# Patient Record
Sex: Male | Born: 1945 | Race: Black or African American | Hispanic: No | Smoking: Current some day smoker
Health system: Southern US, Community
[De-identification: ages and names within clinical notes are randomized; demographics above are authoritative.]

## PROBLEM LIST (undated history)

## (undated) DIAGNOSIS — N529 Male erectile dysfunction, unspecified: Secondary | ICD-10-CM

## (undated) DIAGNOSIS — E538 Deficiency of other specified B group vitamins: Secondary | ICD-10-CM

## (undated) DIAGNOSIS — T7840XA Allergy, unspecified, initial encounter: Secondary | ICD-10-CM

## (undated) DIAGNOSIS — M1990 Unspecified osteoarthritis, unspecified site: Secondary | ICD-10-CM

## (undated) DIAGNOSIS — K219 Gastro-esophageal reflux disease without esophagitis: Secondary | ICD-10-CM

## (undated) DIAGNOSIS — R112 Nausea with vomiting, unspecified: Secondary | ICD-10-CM

## (undated) DIAGNOSIS — I10 Essential (primary) hypertension: Secondary | ICD-10-CM

## (undated) DIAGNOSIS — M545 Low back pain, unspecified: Secondary | ICD-10-CM

## (undated) DIAGNOSIS — Z9889 Other specified postprocedural states: Secondary | ICD-10-CM

## (undated) DIAGNOSIS — E785 Hyperlipidemia, unspecified: Secondary | ICD-10-CM

## (undated) DIAGNOSIS — R011 Cardiac murmur, unspecified: Secondary | ICD-10-CM

## (undated) DIAGNOSIS — N400 Enlarged prostate without lower urinary tract symptoms: Secondary | ICD-10-CM

## (undated) HISTORY — PX: BACK SURGERY: SHX140

## (undated) HISTORY — DX: Male erectile dysfunction, unspecified: N52.9

## (undated) HISTORY — DX: Low back pain: M54.5

## (undated) HISTORY — DX: Low back pain, unspecified: M54.50

## (undated) HISTORY — DX: Benign prostatic hyperplasia without lower urinary tract symptoms: N40.0

## (undated) HISTORY — PX: COLONOSCOPY: SHX174

## (undated) HISTORY — DX: Allergy, unspecified, initial encounter: T78.40XA

## (undated) HISTORY — DX: Gastro-esophageal reflux disease without esophagitis: K21.9

## (undated) HISTORY — PX: POLYPECTOMY: SHX149

## (undated) HISTORY — DX: Hyperlipidemia, unspecified: E78.5

## (undated) HISTORY — DX: Deficiency of other specified B group vitamins: E53.8

## (undated) HISTORY — DX: Essential (primary) hypertension: I10

---

## 1999-05-22 LAB — HM COLONOSCOPY

## 2000-01-22 ENCOUNTER — Encounter: Payer: Self-pay | Admitting: Emergency Medicine

## 2000-01-22 ENCOUNTER — Emergency Department (HOSPITAL_COMMUNITY): Admission: EM | Admit: 2000-01-22 | Discharge: 2000-01-22 | Payer: Self-pay | Admitting: Emergency Medicine

## 2000-05-21 HISTORY — PX: KNEE ARTHROSCOPY: SUR90

## 2001-03-23 ENCOUNTER — Emergency Department (HOSPITAL_COMMUNITY): Admission: EM | Admit: 2001-03-23 | Discharge: 2001-03-23 | Payer: Self-pay | Admitting: Emergency Medicine

## 2001-10-09 ENCOUNTER — Emergency Department (HOSPITAL_COMMUNITY): Admission: EM | Admit: 2001-10-09 | Discharge: 2001-10-09 | Payer: Self-pay | Admitting: *Deleted

## 2002-01-20 ENCOUNTER — Emergency Department (HOSPITAL_COMMUNITY): Admission: EM | Admit: 2002-01-20 | Discharge: 2002-01-20 | Payer: Self-pay

## 2002-04-08 ENCOUNTER — Encounter: Payer: Self-pay | Admitting: Specialist

## 2002-04-08 ENCOUNTER — Ambulatory Visit (HOSPITAL_COMMUNITY): Admission: RE | Admit: 2002-04-08 | Discharge: 2002-04-08 | Payer: Self-pay | Admitting: Specialist

## 2003-01-24 ENCOUNTER — Emergency Department (HOSPITAL_COMMUNITY): Admission: EM | Admit: 2003-01-24 | Discharge: 2003-01-25 | Payer: Self-pay | Admitting: Emergency Medicine

## 2003-01-25 ENCOUNTER — Encounter: Payer: Self-pay | Admitting: Emergency Medicine

## 2004-12-20 ENCOUNTER — Ambulatory Visit: Payer: Self-pay | Admitting: Internal Medicine

## 2005-01-04 ENCOUNTER — Ambulatory Visit: Payer: Self-pay

## 2005-01-15 ENCOUNTER — Ambulatory Visit: Payer: Self-pay | Admitting: Internal Medicine

## 2005-02-28 ENCOUNTER — Ambulatory Visit: Payer: Self-pay | Admitting: Cardiology

## 2005-07-18 ENCOUNTER — Ambulatory Visit: Payer: Self-pay | Admitting: Internal Medicine

## 2005-12-12 ENCOUNTER — Ambulatory Visit: Payer: Self-pay | Admitting: Internal Medicine

## 2006-05-08 ENCOUNTER — Ambulatory Visit: Payer: Self-pay | Admitting: Internal Medicine

## 2006-05-08 LAB — CONVERTED CEMR LAB
ALT: 23 units/L (ref 0–40)
AST: 25 units/L (ref 0–37)
Albumin: 4 g/dL (ref 3.5–5.2)
Alkaline Phosphatase: 110 units/L (ref 39–117)
BUN: 9 mg/dL (ref 6–23)
CO2: 27 meq/L (ref 19–32)
Calcium: 9.6 mg/dL (ref 8.4–10.5)
Chloride: 105 meq/L (ref 96–112)
Chol/HDL Ratio, serum: 4.3
Cholesterol: 177 mg/dL (ref 0–200)
Creatinine, Ser: 0.8 mg/dL (ref 0.4–1.5)
GFR calc non Af Amer: 105 mL/min
Glomerular Filtration Rate, Af Am: 127 mL/min/{1.73_m2}
Glucose, Bld: 108 mg/dL — ABNORMAL HIGH (ref 70–99)
HCT: 46.3 % (ref 39.0–52.0)
HDL: 41.3 mg/dL (ref >39.0)
Hemoglobin, Urine: NEGATIVE
Hemoglobin: 15.4 g/dL (ref 13.0–17.0)
Hgb A1c MFr Bld: 5.6 % (ref 4.6–6.0)
Ketones, ur: NEGATIVE mg/dL
LDL Cholesterol: 123 mg/dL — ABNORMAL HIGH (ref 0–99)
Leukocytes, UA: NEGATIVE
MCHC: 33.3 g/dL (ref 30.0–36.0)
MCV: 89.6 fL (ref 78.0–100.0)
Nitrite: NEGATIVE
PSA: 0.86 ng/mL (ref 0.10–4.00)
Platelets: 253 10*3/uL (ref 150–400)
Potassium: 3.9 meq/L (ref 3.5–5.1)
RBC: 5.16 M/uL (ref 4.22–5.81)
RDW: 12.4 % (ref 11.5–14.6)
Sodium: 139 meq/L (ref 135–145)
Specific Gravity, Urine: 1.025 (ref 1.000–1.03)
TSH: 1.12 microintl units/mL (ref 0.35–5.50)
Total Bilirubin: 1.3 mg/dL — ABNORMAL HIGH (ref 0.3–1.2)
Total Protein, Urine: NEGATIVE mg/dL
Total Protein: 7.1 g/dL (ref 6.0–8.3)
Triglyceride fasting, serum: 63 mg/dL (ref 0–149)
Urine Glucose: NEGATIVE mg/dL
Urobilinogen, UA: 0.2 (ref 0.0–1.0)
VLDL: 13 mg/dL (ref 0–40)
Vitamin B-12: 280 pg/mL (ref 211–911)
WBC: 7.3 10*3/uL (ref 4.5–10.5)
pH: 6 (ref 5.0–8.0)

## 2006-06-12 ENCOUNTER — Ambulatory Visit: Payer: Self-pay | Admitting: Internal Medicine

## 2006-12-11 ENCOUNTER — Ambulatory Visit: Payer: Self-pay | Admitting: Internal Medicine

## 2007-05-16 ENCOUNTER — Telehealth: Payer: Self-pay | Admitting: Internal Medicine

## 2007-05-19 ENCOUNTER — Ambulatory Visit: Payer: Self-pay | Admitting: Internal Medicine

## 2007-05-19 DIAGNOSIS — E538 Deficiency of other specified B group vitamins: Secondary | ICD-10-CM | POA: Insufficient documentation

## 2007-05-28 LAB — CONVERTED CEMR LAB
ALT: 29 units/L (ref 0–53)
AST: 25 units/L (ref 0–37)
Albumin: 3.9 g/dL (ref 3.5–5.2)
Alkaline Phosphatase: 100 units/L (ref 39–117)
BUN: 10 mg/dL (ref 6–23)
Basophils Absolute: 0 10*3/uL (ref 0.0–0.1)
Basophils Relative: 0.5 % (ref 0.0–1.0)
Bilirubin Urine: NEGATIVE
Bilirubin, Direct: 0.2 mg/dL (ref 0.0–0.3)
CO2: 32 meq/L (ref 19–32)
Calcium: 9.5 mg/dL (ref 8.4–10.5)
Chloride: 101 meq/L (ref 96–112)
Creatinine, Ser: 0.9 mg/dL (ref 0.4–1.5)
Eosinophils Absolute: 0.1 10*3/uL (ref 0.0–0.6)
Eosinophils Relative: 1.9 % (ref 0.0–5.0)
GFR calc Af Amer: 111 mL/min
GFR calc non Af Amer: 91 mL/min
Glucose, Bld: 100 mg/dL — ABNORMAL HIGH (ref 70–99)
HCT: 44.9 % (ref 39.0–52.0)
Hemoglobin, Urine: NEGATIVE
Hemoglobin: 15.6 g/dL (ref 13.0–17.0)
Ketones, ur: NEGATIVE mg/dL
Leukocytes, UA: NEGATIVE
Lymphocytes Relative: 23.4 % (ref 12.0–46.0)
MCHC: 34.9 g/dL (ref 30.0–36.0)
MCV: 89 fL (ref 78.0–100.0)
Monocytes Absolute: 0.7 10*3/uL (ref 0.2–0.7)
Monocytes Relative: 9.4 % (ref 3.0–11.0)
Neutro Abs: 4.6 10*3/uL (ref 1.4–7.7)
Neutrophils Relative %: 64.8 % (ref 43.0–77.0)
Nitrite: NEGATIVE
PSA: 0.79 ng/mL (ref 0.10–4.00)
Platelets: 215 10*3/uL (ref 150–400)
Potassium: 3.6 meq/L (ref 3.5–5.1)
RBC: 5.04 M/uL (ref 4.22–5.81)
RDW: 12.2 % (ref 11.5–14.6)
Sodium: 140 meq/L (ref 135–145)
Specific Gravity, Urine: 1.02 (ref 1.000–1.03)
TSH: 1.67 microintl units/mL (ref 0.35–5.50)
Total Bilirubin: 0.8 mg/dL (ref 0.3–1.2)
Total Protein, Urine: NEGATIVE mg/dL
Total Protein: 7.4 g/dL (ref 6.0–8.3)
Urine Glucose: NEGATIVE mg/dL
Urobilinogen, UA: 0.2 (ref 0.0–1.0)
Vitamin B-12: 211 pg/mL (ref 211–911)
WBC: 7.1 10*3/uL (ref 4.5–10.5)
pH: 6 (ref 5.0–8.0)

## 2007-06-18 ENCOUNTER — Ambulatory Visit: Payer: Self-pay | Admitting: Internal Medicine

## 2007-06-18 DIAGNOSIS — R350 Frequency of micturition: Secondary | ICD-10-CM | POA: Insufficient documentation

## 2007-06-18 DIAGNOSIS — I10 Essential (primary) hypertension: Secondary | ICD-10-CM | POA: Insufficient documentation

## 2007-07-30 ENCOUNTER — Ambulatory Visit: Payer: Self-pay | Admitting: Endocrinology

## 2007-07-30 ENCOUNTER — Telehealth (INDEPENDENT_AMBULATORY_CARE_PROVIDER_SITE_OTHER): Payer: Self-pay | Admitting: *Deleted

## 2007-10-15 ENCOUNTER — Ambulatory Visit: Payer: Self-pay | Admitting: Internal Medicine

## 2007-10-16 DIAGNOSIS — E785 Hyperlipidemia, unspecified: Secondary | ICD-10-CM | POA: Insufficient documentation

## 2008-03-17 ENCOUNTER — Ambulatory Visit: Payer: Self-pay | Admitting: Internal Medicine

## 2008-03-19 LAB — CONVERTED CEMR LAB
ALT: 26 U/L
AST: 25 U/L
Albumin: 3.9 g/dL
Alkaline Phosphatase: 107 U/L
BUN: 13 mg/dL
Basophils Absolute: 0 K/uL
Basophils Relative: 0.4 %
Bilirubin Urine: NEGATIVE
Bilirubin, Direct: 0.2 mg/dL
CO2: 32 meq/L
Calcium: 9.3 mg/dL
Chloride: 108 meq/L
Cholesterol: 170 mg/dL
Creatinine, Ser: 0.7 mg/dL
Eosinophils Absolute: 0.2 K/uL
Eosinophils Relative: 3 %
Folate: 14.4 ng/mL
GFR calc Af Amer: 148 mL/min
GFR calc non Af Amer: 122 mL/min
Glucose, Bld: 91 mg/dL
HCT: 41.5 %
HDL: 32.9 mg/dL — ABNORMAL LOW
Hemoglobin, Urine: NEGATIVE
Hemoglobin: 14.4 g/dL
Ketones, ur: NEGATIVE mg/dL
LDL Cholesterol: 123 mg/dL — ABNORMAL HIGH
Leukocytes, UA: NEGATIVE
Lymphocytes Relative: 29.2 %
MCHC: 34.8 g/dL
MCV: 88.9 fL
Monocytes Absolute: 0.8 K/uL
Monocytes Relative: 13.2 % — ABNORMAL HIGH
Neutro Abs: 3.2 K/uL
Neutrophils Relative %: 54.2 %
Nitrite: NEGATIVE
PSA: 1.34 ng/mL
Platelets: 202 K/uL
Potassium: 4.5 meq/L
RBC: 4.67 M/uL
RDW: 12.4 %
Sodium: 145 meq/L
Specific Gravity, Urine: 1.015
TSH: 1.08 u[IU]/mL
Total Bilirubin: 0.9 mg/dL
Total CHOL/HDL Ratio: 5.2
Total Protein, Urine: NEGATIVE mg/dL
Total Protein: 6.7 g/dL
Triglycerides: 71 mg/dL
Urine Glucose: NEGATIVE mg/dL
Urobilinogen, UA: 0.2
VLDL: 14 mg/dL
Vitamin B-12: 518 pg/mL
WBC: 5.9 10*3/microliter
pH: 6.5

## 2008-03-31 ENCOUNTER — Ambulatory Visit: Payer: Self-pay | Admitting: Internal Medicine

## 2009-03-30 ENCOUNTER — Ambulatory Visit: Payer: Self-pay | Admitting: Internal Medicine

## 2009-03-30 DIAGNOSIS — M545 Low back pain, unspecified: Secondary | ICD-10-CM | POA: Insufficient documentation

## 2009-03-30 DIAGNOSIS — N400 Enlarged prostate without lower urinary tract symptoms: Secondary | ICD-10-CM | POA: Insufficient documentation

## 2009-03-30 LAB — CONVERTED CEMR LAB
ALT: 28 units/L (ref 0–53)
AST: 30 units/L (ref 0–37)
Albumin: 4.3 g/dL (ref 3.5–5.2)
Alkaline Phosphatase: 98 units/L (ref 39–117)
BUN: 13 mg/dL (ref 6–23)
Basophils Absolute: 0 10*3/uL (ref 0.0–0.1)
Basophils Relative: 0.6 % (ref 0.0–3.0)
Bilirubin Urine: NEGATIVE
Bilirubin, Direct: 0.1 mg/dL (ref 0.0–0.3)
CO2: 30 meq/L (ref 19–32)
Calcium: 9.3 mg/dL (ref 8.4–10.5)
Chloride: 105 meq/L (ref 96–112)
Cholesterol: 181 mg/dL (ref 0–200)
Creatinine, Ser: 0.8 mg/dL (ref 0.4–1.5)
Eosinophils Absolute: 0.2 10*3/uL (ref 0.0–0.7)
Eosinophils Relative: 2.7 % (ref 0.0–5.0)
GFR calc non Af Amer: 126.01 mL/min (ref >60)
Glucose, Bld: 99 mg/dL (ref 70–99)
HCT: 44.4 % (ref 39.0–52.0)
HDL: 37.1 mg/dL — ABNORMAL LOW (ref >39.00)
Hemoglobin, Urine: NEGATIVE
Hemoglobin: 15 g/dL (ref 13.0–17.0)
Ketones, ur: NEGATIVE mg/dL
LDL Cholesterol: 134 mg/dL — ABNORMAL HIGH (ref 0–99)
Leukocytes, UA: NEGATIVE
Lymphocytes Relative: 30.7 % (ref 12.0–46.0)
Lymphs Abs: 1.8 10*3/uL (ref 0.7–4.0)
MCHC: 33.7 g/dL (ref 30.0–36.0)
MCV: 89.6 fL (ref 78.0–100.0)
Monocytes Absolute: 0.7 10*3/uL (ref 0.1–1.0)
Monocytes Relative: 11.6 % (ref 3.0–12.0)
Neutro Abs: 3.2 10*3/uL (ref 1.4–7.7)
Neutrophils Relative %: 54.4 % (ref 43.0–77.0)
Nitrite: NEGATIVE
PSA: 1.1 ng/mL (ref 0.10–4.00)
Platelets: 204 10*3/uL (ref 150.0–400.0)
Potassium: 3.8 meq/L (ref 3.5–5.1)
RBC: 4.96 M/uL (ref 4.22–5.81)
RDW: 12.4 % (ref 11.5–14.6)
Sodium: 142 meq/L (ref 135–145)
Specific Gravity, Urine: 1.02 (ref 1.000–1.030)
TSH: 1.2 microintl units/mL (ref 0.35–5.50)
Total Bilirubin: 1.2 mg/dL (ref 0.3–1.2)
Total CHOL/HDL Ratio: 5
Total Protein, Urine: NEGATIVE mg/dL
Total Protein: 8.5 g/dL — ABNORMAL HIGH (ref 6.0–8.3)
Triglycerides: 49 mg/dL (ref 0.0–149.0)
Urine Glucose: NEGATIVE mg/dL
Urobilinogen, UA: 0.2 (ref 0.0–1.0)
VLDL: 9.8 mg/dL (ref 0.0–40.0)
Vitamin B-12: 450 pg/mL (ref 211–911)
WBC: 5.9 10*3/uL (ref 4.5–10.5)
pH: 5.5 (ref 5.0–8.0)

## 2009-04-16 ENCOUNTER — Ambulatory Visit: Payer: Self-pay | Admitting: Family Medicine

## 2009-04-16 DIAGNOSIS — M25519 Pain in unspecified shoulder: Secondary | ICD-10-CM | POA: Insufficient documentation

## 2009-05-16 ENCOUNTER — Ambulatory Visit: Payer: Self-pay | Admitting: Internal Medicine

## 2009-05-16 DIAGNOSIS — R51 Headache: Secondary | ICD-10-CM | POA: Insufficient documentation

## 2009-05-16 DIAGNOSIS — R519 Headache, unspecified: Secondary | ICD-10-CM | POA: Insufficient documentation

## 2009-05-18 LAB — CONVERTED CEMR LAB
BUN: 10 mg/dL (ref 6–23)
CO2: 30 meq/L (ref 19–32)
Calcium: 9.4 mg/dL (ref 8.4–10.5)
Chloride: 105 meq/L (ref 96–112)
Creatinine, Ser: 0.7 mg/dL (ref 0.4–1.5)
GFR calc non Af Amer: 146.94 mL/min (ref >60)
Glucose, Bld: 108 mg/dL — ABNORMAL HIGH (ref 70–99)
Potassium: 3.8 meq/L (ref 3.5–5.1)
Sed Rate: 11 mm/hr (ref 0–22)
Sodium: 142 meq/L (ref 135–145)

## 2009-08-16 ENCOUNTER — Ambulatory Visit: Payer: Self-pay | Admitting: Internal Medicine

## 2009-11-23 ENCOUNTER — Ambulatory Visit: Payer: Self-pay | Admitting: Internal Medicine

## 2009-12-27 ENCOUNTER — Ambulatory Visit: Payer: Self-pay | Admitting: Internal Medicine

## 2010-02-01 ENCOUNTER — Ambulatory Visit: Payer: Self-pay | Admitting: Internal Medicine

## 2010-02-01 LAB — CONVERTED CEMR LAB
ALT: 24 units/L (ref 0–53)
AST: 21 units/L (ref 0–37)
Albumin: 4.1 g/dL (ref 3.5–5.2)
Alkaline Phosphatase: 96 units/L (ref 39–117)
BUN: 14 mg/dL (ref 6–23)
Basophils Absolute: 0 10*3/uL (ref 0.0–0.1)
Basophils Relative: 0.4 % (ref 0.0–3.0)
Bilirubin Urine: NEGATIVE
Bilirubin, Direct: 0.2 mg/dL (ref 0.0–0.3)
CO2: 30 meq/L (ref 19–32)
Calcium: 9.4 mg/dL (ref 8.4–10.5)
Chloride: 106 meq/L (ref 96–112)
Cholesterol: 186 mg/dL (ref 0–200)
Creatinine, Ser: 0.7 mg/dL (ref 0.4–1.5)
Eosinophils Absolute: 0.2 10*3/uL (ref 0.0–0.7)
Eosinophils Relative: 3.1 % (ref 0.0–5.0)
GFR calc non Af Amer: 139.67 mL/min (ref >60)
Glucose, Bld: 89 mg/dL (ref 70–99)
HCT: 43.7 % (ref 39.0–52.0)
HDL: 47.3 mg/dL (ref >39.00)
Hemoglobin, Urine: NEGATIVE
Hemoglobin: 15.1 g/dL (ref 13.0–17.0)
Ketones, ur: NEGATIVE mg/dL
LDL Cholesterol: 134 mg/dL — ABNORMAL HIGH (ref 0–99)
Leukocytes, UA: NEGATIVE
Lymphocytes Relative: 24.7 % (ref 12.0–46.0)
Lymphs Abs: 1.8 10*3/uL (ref 0.7–4.0)
MCHC: 34.5 g/dL (ref 30.0–36.0)
MCV: 89.2 fL (ref 78.0–100.0)
Monocytes Absolute: 0.8 10*3/uL (ref 0.1–1.0)
Monocytes Relative: 10.9 % (ref 3.0–12.0)
Neutro Abs: 4.4 10*3/uL (ref 1.4–7.7)
Neutrophils Relative %: 60.9 % (ref 43.0–77.0)
Nitrite: NEGATIVE
PSA: 0.61 ng/mL (ref 0.10–4.00)
Platelets: 216 10*3/uL (ref 150.0–400.0)
Potassium: 4.4 meq/L (ref 3.5–5.1)
RBC: 4.89 M/uL (ref 4.22–5.81)
RDW: 13.8 % (ref 11.5–14.6)
Sodium: 142 meq/L (ref 135–145)
Specific Gravity, Urine: 1.025 (ref 1.000–1.030)
TSH: 0.95 microintl units/mL (ref 0.35–5.50)
Total Bilirubin: 1 mg/dL (ref 0.3–1.2)
Total CHOL/HDL Ratio: 4
Total Protein, Urine: NEGATIVE mg/dL
Total Protein: 7.1 g/dL (ref 6.0–8.3)
Triglycerides: 26 mg/dL (ref 0.0–149.0)
Urine Glucose: NEGATIVE mg/dL
Urobilinogen, UA: 0.2 (ref 0.0–1.0)
VLDL: 5.2 mg/dL (ref 0.0–40.0)
Vitamin B-12: 435 pg/mL (ref 211–911)
WBC: 7.3 10*3/uL (ref 4.5–10.5)
pH: 6 (ref 5.0–8.0)

## 2010-02-14 ENCOUNTER — Ambulatory Visit: Payer: Self-pay | Admitting: Internal Medicine

## 2010-02-14 ENCOUNTER — Encounter: Payer: Self-pay | Admitting: Internal Medicine

## 2010-04-03 ENCOUNTER — Encounter (INDEPENDENT_AMBULATORY_CARE_PROVIDER_SITE_OTHER): Payer: Self-pay | Admitting: *Deleted

## 2010-05-31 ENCOUNTER — Encounter (INDEPENDENT_AMBULATORY_CARE_PROVIDER_SITE_OTHER): Payer: Self-pay | Admitting: *Deleted

## 2010-06-12 ENCOUNTER — Encounter (INDEPENDENT_AMBULATORY_CARE_PROVIDER_SITE_OTHER): Payer: Self-pay | Admitting: *Deleted

## 2010-06-14 ENCOUNTER — Ambulatory Visit
Admission: RE | Admit: 2010-06-14 | Discharge: 2010-06-14 | Payer: Self-pay | Source: Home / Self Care | Attending: Internal Medicine | Admitting: Internal Medicine

## 2010-06-20 NOTE — Assessment & Plan Note (Signed)
Summary: 3 MO ROV /NWS   Vital Signs:  Patient profile:   65 year old male Weight:      213 pounds Temp:     97 degrees F oral Pulse rate:   56 / minute BP sitting:   110 / 92  (left arm)  Vitals Entered By: Tora Perches (August 16, 2009 2:14 PM) CC: f/u Is Patient Diabetic? No   CC:  f/u.  History of Present Illness: F/u HTN, B12 and D def  Preventive Screening-Counseling & Management  Alcohol-Tobacco     Smoking Status: quit  Current Medications (verified): 1)  Vitamin B-12 1000 Mcg  Tabs (Cyanocobalamin) .... 2 Qd 2)  Vitamin D3 1000 Unit  Tabs (Cholecalciferol) .Marland Kitchen.. 1 Qd 3)  Ibuprofen 600 Mg  Tabs (Ibuprofen) .Marland Kitchen.. 1 By Mouth Two Times A Day As Needed Pain 4)  Vicodin 5-500 Mg Tabs (Hydrocodone-Acetaminophen) .... One Tab By Mouth Two Times A Day 5)  Azor 10-40 Mg Tabs (Amlodipine-Olmesartan) .Marland Kitchen.. 1 By Mouth Once Daily For Blood Pressure  Allergies: 1)  Hydrochlorothiazide  Past History:  Past Medical History: Last updated: 03/30/2009 Hypertension ED Vit B12 def Hyperlipidemia Benign prostatic hypertrophy Low back pain  Family History: Reviewed history from 06/18/2007 and no changes required. 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    Physical Exam  General:  Well-developed,well-nourished,in no acute distress; alert,appropriate and cooperative throughout examination Mouth:  Oral mucosa and oropharynx without lesions or exudates.  Teeth in good repair. Lungs:  Normal respiratory effort, chest expands symmetrically. Lungs are clear to auscultation, no crackles or wheezes. Heart:  Normal rate and regular rhythm. S1 and S2 normal without gallop, murmur, click, rub or other extra sounds. Abdomen:  Bowel sounds positive,abdomen soft and non-tender without masses,  organomegaly or hernias noted.   Impression & Recommendations:  Problem # 1:  B12 DEFICIENCY (ICD-266.2) Assessment Improved On prescription/OTC  therapy   Problem # 2:  HYPERTENSION (ICD-401.9) Assessment: Improved  His updated medication list for this problem includes:    Azor 10-40 Mg Tabs (Amlodipine-olmesartan) .Marland Kitchen... 1 by mouth once daily for blood pressure  Problem # 3:  HYPERLIPIDEMIA (ICD-272.4) On diet  Complete Medication List: 1)  Vitamin B-12 1000 Mcg Tabs (Cyanocobalamin) .... 2 qd 2)  Vitamin D3 1000 Unit Tabs (Cholecalciferol) .Marland Kitchen.. 1 qd 3)  Ibuprofen 600 Mg Tabs (Ibuprofen) .Marland Kitchen.. 1 by mouth two times a day as needed pain 4)  Vicodin 5-500 Mg Tabs (Hydrocodone-acetaminophen) .... One tab by mouth two times a day 5)  Azor 10-40 Mg Tabs (Amlodipine-olmesartan) .Marland Kitchen.. 1 by mouth once daily for blood pressure  Patient Instructions: 1)  Please schedule a follow-up appointment in 6 months well w/labs and Vit B12 266.20. Prescriptions: AZOR 10-40 MG TABS (AMLODIPINE-OLMESARTAN) 1 by mouth once daily for blood pressure  #90 x 3   Entered and Authorized by:   Tresa Garter MD   Signed by:   Tresa Garter MD on 08/16/2009   Method used:   Print then Give to Patient   RxID:   [id number]

## 2010-06-20 NOTE — Assessment & Plan Note (Signed)
Summary: CPX /NWS #   Vital Signs:  Patient profile:   65 year old male Height:      72 inches Weight:      207 pounds BMI:     28.18 Temp:     97.8 degrees F oral Pulse rate:   80 / minute Pulse rhythm:   regular Resp:     16 per minute BP sitting:   138 / 84  (left arm) Cuff size:   regular  Vitals Entered By: Lanier Prude, Beverly Gust) (February 14, 2010 3:11 PM) CC: CPX Is Patient Diabetic? No Comments pt is not taking Vicodin, Loratadine or Prednisone. Please remove from list   CC:  CPX.  History of Present Illness: The patient presents for a preventive health examination  C/o leg cramps at night  Preventive Screening-Counseling & Management  Alcohol-Tobacco     Smoking Status: quit  Current Medications (verified): 1)  Vitamin B-12 1000 Mcg  Tabs (Cyanocobalamin) .... 2 Qd 2)  Vitamin D3 1000 Unit  Tabs (Cholecalciferol) .Marland Kitchen.. 1 Qd 3)  Ibuprofen 600 Mg  Tabs (Ibuprofen) .Marland Kitchen.. 1 By Mouth Two Times A Day As Needed Pain 4)  Vicodin 5-500 Mg Tabs (Hydrocodone-Acetaminophen) .... One Tab By Mouth Two Times A Day 5)  Azor 10-40 Mg Tabs (Amlodipine-Olmesartan) .Marland Kitchen.. 1 By Mouth Once Daily For Blood Pressure 6)  Aleve 220 Mg Tabs (Naproxen Sodium) .Marland Kitchen.. 1 As Needed 7)  Spironolactone 50 Mg Tabs (Spironolactone) .Marland Kitchen.. 1 By Mouth Qam For Blood Pressure 8)  Triamcinolone Acetonide 0.5 % Crea (Triamcinolone Acetonide) .... Use Two Times A Day Prn 9)  Loratadine 10 Mg Tabs (Loratadine) .Marland Kitchen.. 1 By Mouth Once Daily As Needed Allergies 10)  Prednisone 10 Mg Tabs (Prednisone) .... Take 40mg  Qd For 3 Days, Then 20 Mg Qd For 3 Days, Then 10mg  Qd For 6 Days, Then Stop. Take Pc.  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    Physical Exam  General:  Well-developed,well-nourished,in no acute distress; alert,appropriate and cooperative throughout examination Head:  Normocephalic and atraumatic without obvious abnormalities. No apparent alopecia or balding. No pulsating and tender vessels Eyes:  No corneal or conjunctival inflammation noted. EOMI. Perrla.  Ears:  External ear exam shows no significant lesions or deformities.  Otoscopic examination reveals clear canals, tympanic membranes are intact bilaterally without bulging, retraction, inflammation or discharge. Hearing is grossly normal bilaterally. Nose:  External nasal examination shows no deformity or inflammation. Nasal mucosa are pink and moist without lesions or exudates. Mouth:  Oral mucosa and oropharynx without lesions or exudates.  Teeth in good repair. Neck:  No deformities, masses, or tenderness noted. Lungs:  Normal respiratory effort, chest expands symmetrically. Lungs are clear to auscultation, no crackles or wheezes. Heart:  Normal rate and regular rhythm. S1 and S2 normal  without gallop, murmur, click, rub or other extra sounds. Abdomen:  Bowel sounds positive,abdomen soft and non-tender without masses, organomegaly or hernias noted. Prostate:  1+ enlarged.   Msk:  Lumbar-sacral  spine is tender to palpation over paraspinal muscles and painfull with the ROM  Stiff ls flat feet Neurologic:  No cranial nerve deficits noted. Station and gait are normal. Plantar reflexes are down-going bilaterally. DTRs are symmetrical throughout. Sensory, motor and coordinative functions appear intact. Skin:  Eryth. papules 0.5-2.0 cm with scabs and excoriations on LEs - multiples Psych:  Cognition and judgment appear intact. Alert and cooperative with normal attention span and concentration. No apparent delusions, illusions, hallucinations   Impression & Recommendations:  Problem # 1:  WELL ADULT EXAM (ICD-V70.0) Assessment New Health and age related issues were discussed. Available screening tests and vaccinations were discussed as well. Healthy life style including good diet and exercise was discussed.  The labs were reviewed with the patient.  Orders: EKG w/ Interpretation (93000) ok Gastroenterology Referral (GI)  Problem # 2:  HYPERTENSION (ICD-401.9) Assessment: Comment Only  His updated medication list for this problem includes:    Azor 10-40 Mg Tabs (Amlodipine-olmesartan) .Marland Kitchen... 1 by mouth once daily for blood pressure    Spironolactone 50 Mg Tabs (Spironolactone) .Marland Kitchen... 1 by mouth qam for blood pressure  BP today: 138/84 Prior BP: 142/68 (12/27/2009)  Labs Reviewed: K+: 4.4 (02/01/2010) Creat: : 0.7 (02/01/2010)   Chol: 186 (02/01/2010)   HDL: 47.30 (02/01/2010)   LDL: 134 (02/01/2010)   TG: 26.0 (02/01/2010)  Problem # 3:  B12 DEFICIENCY (ICD-266.2) Assessment: Unchanged On the regimen of medicine(s) reflected in the chart    Problem # 4:  HYPERLIPIDEMIA (ICD-272.4) Assessment: Comment Only on diet  Complete Medication List: 1)  Ibuprofen 600 Mg Tabs (Ibuprofen) .Marland Kitchen.. 1 by mouth two times a day as needed pain 2)  Vicodin 5-500 Mg Tabs (Hydrocodone-acetaminophen) .... One tab by mouth two times a day 3)  Azor 10-40 Mg Tabs (Amlodipine-olmesartan) .Marland Kitchen.. 1 by  mouth once daily for blood pressure 4)  Aleve 220 Mg Tabs (Naproxen sodium) .Marland Kitchen.. 1 as needed 5)  Spironolactone 50 Mg Tabs (Spironolactone) .Marland Kitchen.. 1 by mouth qam for blood pressure 6)  Triamcinolone Acetonide 0.5 % Crea (Triamcinolone acetonide) .... Use two times a day prn 7)  Loratadine 10 Mg Tabs (Loratadine) .Marland Kitchen.. 1 by mouth once daily as needed allergies 8)  Vitamin B-12 1000 Mcg Tabs (Cyanocobalamin) .... 2 qd 9)  Vitamin D3 1000 Unit Tabs (Cholecalciferol) .Marland Kitchen.. 1 qd 10)  Cialis 20 Mg Tabs (Tadalafil) .... 1/2 or 1 by mouth q 1-3 d prn  Other Orders: Zoster (Shingles) Vaccine Live [phone]) Admin 1st Vaccine (60454)  Patient Instructions: 1)  Please schedule a follow-up appointment in 6 months. 2)  Go on Youtube ([email or web address]) and look up "piriformis stretch", "Ileopsoas stretch", "IT band stretch" and "gluteus stretch". See the anatomy and learn the symptoms.  You can try to self-diagnose. Do the stretches - it may help!  Prescriptions: CIALIS 20 MG TABS (TADALAFIL) 1/2 or 1 by mouth q 1-3 d prn  #6 x 12   Entered and Authorized by:   Tresa Garter MD   Signed by:   Tresa Garter MD on 02/14/2010   Method used:   Print then Give to Patient   RxID:   [id number]    Immunizations Administered:  Zostavax # 1:    Vaccine Type: Zostavax    Site: left deltoid    Mfr:  Merck    Dose: 0.65    Route: [employer]    Given by: Lanier Prude, CMA(AAMA)    Exp. Date: 12/14/2010    Lot #: [id number]    VIS given: 03/02/05 given February 14, 2010.  Not Administered:    Influenza Vaccine not given due to: declined     Contraindications/Deferment of Procedures/Staging:    Test/Procedure: FLU VAX    Reason for deferment: patient declined

## 2010-06-20 NOTE — Assessment & Plan Note (Signed)
Summary: BED BUG BITES/NWS   Vital Signs:  Patient profile:   65 year old male Temp:     98.0 degrees F oral Pulse rate:   56 / minute Pulse rhythm:   regular BP sitting:   142 / 68  (left arm)  Vitals Entered By: Lamar Sprinkles, CMA (December 27, 2009 4:05 PM) CC: C/o bites from bed bugs during hotel stay. Bites on arms and legs    CC:  C/o bites from bed bugs during hotel stay. Bites on arms and legs .  History of Present Illness: C/o hives started 1 wk after sleeping at the extended stay hotel - itching. No fever.  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    Physical Exam  General:  Well-developed,well-nourished,in no acute distress; alert,appropriate and cooperative throughout examination Nose:  External nasal examination shows no deformity or inflammation. Nasal mucosa are pink and moist without lesions or exudates. Mouth:  Oral mucosa and oropharynx without lesions or exudates.  Teeth in good repair. Neck:  No deformities, masses, or tenderness noted. Lungs:  Normal respiratory effort, chest expands symmetrically. Lungs are clear to auscultation, no crackles or wheezes. Heart:  Normal rate and regular rhythm. S1 and S2 normal without gallop, murmur, click, rub or other extra sounds. Abdomen:  Bowel sounds positive,abdomen soft and non-tender without masses, organomegaly or hernias noted. Msk:  R shoulder exquisitely tender ver the right A/C joint,  medially, ROM significantly limited, movement in any direction causes pain. No echymosis noted. No swelling noted. His right arm feels tight. Neurologic:  No cranial nerve deficits noted. Station and gait are normal. Plantar reflexes are down-going bilaterally. DTRs are symmetrical throughout. Sensory, motor and coordinative functions appear intact. Skin:  Eryth. papules 0.5-2.0 cm with scabs and excoriations on LEs - multiples Cervical Nodes:  No lymphadenopathy noted Psych:  Cognition and judgment appear intact. Alert and cooperative with normal attention span and concentration. No apparent delusions, illusions, hallucinations   Impression & Recommendations:  Problem # 1:  SKIN RASH (ICD-782.1) poss bed bug bites vs other Assessment New  His updated medication list for this problem includes:    Triamcinolone Acetonide 0.5 % Crea (Triamcinolone acetonide) ..... Use two times a day prn Take 40mg  qd for 3 days, then 20 mg qd for 3 days, then 10mg  qd for 6 days, then stop. Take pc.  Orders: Depo- Medrol 80mg  (J1040) Admin of Therapeutic Inj  intramuscular or subcutaneous (73220)  Problem # 2:  HYPERTENSION (ICD-401.9) Assessment: Improved Doubt #1 is related to meds His updated medication list for this problem includes:    Azor 10-40 Mg Tabs (Amlodipine-olmesartan) .Marland Kitchen... 1 by mouth once daily for blood pressure    Spironolactone 50 Mg Tabs (Spironolactone) .Marland Kitchen... 1 by mouth qam for blood pressure  Problem # 3:  B12 DEFICIENCY (ICD-266.2) Assessment: Unchanged On the regimen of medicine(s) reflected in the chart    Complete Medication List: 1)  Vitamin B-12 1000 Mcg Tabs (Cyanocobalamin) .... 2 qd 2)  Vitamin D3 1000 Unit Tabs (Cholecalciferol) .Marland Kitchen.. 1 qd 3)  Ibuprofen 600  Mg Tabs (Ibuprofen) .Marland Kitchen.. 1 by mouth two times a day as needed pain 4)  Vicodin 5-500 Mg Tabs (Hydrocodone-acetaminophen) .... One tab by mouth two times a day 5)  Azor 10-40 Mg Tabs (Amlodipine-olmesartan) .Marland Kitchen.. 1 by  mouth once daily for blood pressure 6)  Aleve 220 Mg Tabs (Naproxen sodium) .Marland Kitchen.. 1 as needed 7)  Spironolactone 50 Mg Tabs (Spironolactone) .Marland Kitchen.. 1 by mouth qam for blood pressure 8)  Triamcinolone Acetonide 0.5 % Crea (Triamcinolone acetonide) .... Use two times a day prn 9)  Loratadine 10 Mg Tabs (Loratadine) .Marland Kitchen.. 1 by mouth once daily as needed allergies 10)  Prednisone 10 Mg Tabs (Prednisone) .... Take 40mg  qd for 3 days, then 20 mg qd for 3 days, then 10mg  qd for 6 days, then stop. take pc.  Patient Instructions: 1)  Call if you are not better in a reasonable amount of time or if worse.  Prescriptions: PREDNISONE 10 MG TABS (PREDNISONE) Take 40mg  qd for 3 days, then 20 mg qd for 3 days, then 10mg  qd for 6 days, then stop. Take pc.  #24 x 1   Entered and Authorized by:   Tresa Garter MD   Signed by:   Tresa Garter MD on 12/27/2009   Method used:   Electronically to        [facility]  [address] [id number] (retail)       [address]       [place], [place]  [address]       Ph: [phone] or [phone]       Fax: [phone]   RxID:   [id number] LORATADINE 10 MG TABS (LORATADINE) 1 by mouth once daily as needed allergies  #30 x 6   Entered and Authorized by:   Tresa Garter MD   Signed by:   Tresa Garter MD on 12/27/2009   Method used:   Electronically to        [facility]  [id number] (retail)       [address]       [place], [place]  [address]       Ph: [phone] or [phone]       Fax: [phone]   RxID:   [id number] TRIAMCINOLONE ACETONIDE 0.5 % CREA (TRIAMCINOLONE ACETONIDE) use two times a day prn  #120 g x 3   Entered and Authorized by:   Tresa Garter MD   Signed by:   Tresa Garter MD on 12/27/2009   Method used:   [facility] to        [facility]  [address] [id number]       [address]       [place], [place]  [address]       Ph: [phone] or  [phone]       Fax: [phone]   RxID:   [id number]    Medication Administration  Injection # 1:    Medication: Depo- Medrol 80mg     Diagnosis: SKIN RASH (ICD-782.1)    Route: IM    Site: RUOQ gluteus    Exp Date: 08/19/2012    Lot #: [id number]    Mfr: [employer]    Comments: pt rec 120mg     Patient tolerated injection without complications    Given by: Lanier Prude, CMA(AAMA) (December 27, 2009 5:09 PM)  Injection # 2:  Medication: Depo- Medrol 40mg     Diagnosis: SKIN RASH (ICD-782.1)    Comments: same as above    Patient tolerated injection without complications    Given by: Lanier Prude, [facility]) (December 27, 2009 5:09 PM)  Orders Added: 1)  Depo- Medrol 80mg  [J1040] 2)  Admin of Therapeutic Inj  intramuscular or subcutaneous [96372] 3)  Est. Patient Level IV [56433]

## 2010-06-20 NOTE — Letter (Signed)
Summary: Referral - not able to see patient  [facility]  [address] [place], [place] [address]   Phone: [phone]  Fax: [phone]    April 03, 2010   Georgina Quint. Plotnikov, M.D. [address] [place], [place] [address]   Re:   [name] [name] DOB:  09-21-1945 MRN:   [id number]    Dear Dr. Posey Rea:  Thank you for your kind referral of the above patient.  We have attempted to schedule the recommended procedure Screening Colonoscopy but have not been able to schedule because:   X  The patient was not available by phone and/or has not returned our calls.  ___ The patient declined to schedule the procedure at this time.  We appreciate the referral and hope that we will have the opportunity to treat this patient in the future.    Sincerely,    [facility] [phone]

## 2010-06-20 NOTE — Assessment & Plan Note (Signed)
Summary: high blood pressure-lb   Vital Signs:  Patient profile:   65 year old male Height:      72 inches (182.88 cm) Weight:      210 pounds (95.45 kg) BMI:     28.58 O2 Sat:      99 % on Room air Temp:     98.7 degrees F (37.06 degrees C) oral Pulse rate:   61 / minute Pulse rhythm:   regular Resp:     16 per minute BP sitting:   122 / 84  (left arm) Cuff size:   regular  Vitals Entered By: Lanier Prude, CMA(AAMA) (November 23, 2009 3:15 PM)  O2 Flow:  Room air CC: HA & elevated blood pressure X 3 days Is Patient Diabetic? No Comments pt is not taking Vicodin or Ibuprofen.  Please remove from list.   CC:  HA & elevated blood pressure X 3 days.  History of Present Illness: C/o BP 200/114 at home x 4-5 d  C/o HA x 2 d  Current Medications (verified): 1)  Vitamin B-12 1000 Mcg  Tabs (Cyanocobalamin) .... 2 Qd 2)  Vitamin D3 1000 Unit  Tabs (Cholecalciferol) .Marland Kitchen.. 1 Qd 3)  Ibuprofen 600 Mg  Tabs (Ibuprofen) .Marland Kitchen.. 1 By Mouth Two Times A Day As Needed Pain 4)  Vicodin 5-500 Mg Tabs (Hydrocodone-Acetaminophen) .... One Tab By Mouth Two Times A Day 5)  Azor 10-40 Mg Tabs (Amlodipine-Olmesartan) .Marland Kitchen.. 1 By Mouth Once Daily For Blood Pressure 6)  Aleve 220 Mg Tabs (Naproxen Sodium) .Marland Kitchen.. 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    Physical Exam  General:  Well-developed,well-nourished,in no acute distress; alert,appropriate and cooperative throughout examination Nose:  External nasal examination shows no deformity or inflammation. Nasal mucosa are  pink and moist without lesions or exudates. Mouth:  Oral mucosa and oropharynx without lesions or exudates.  Teeth in good repair. Neck:  No deformities, masses, or tenderness noted. Lungs:  Normal respiratory effort, chest expands symmetrically. Lungs are clear to auscultation, no crackles or wheezes. Heart:  Normal rate and regular rhythm. S1 and S2 normal without gallop, murmur, click, rub or other extra sounds. Abdomen:  Bowel sounds positive,abdomen soft and non-tender without masses, organomegaly or hernias noted. Msk:  R shoulder exquisitely tender ver the right A/C joint, medially, ROM significantly limited, movement in any direction causes pain. No echymosis noted. No swelling noted. His right arm feels tight. Extremities:  No clubbing, cyanosis, edema, or deformity noted with normal full range of motion of all joints.   Neurologic:  No cranial nerve deficits noted. Station and gait are normal. Plantar reflexes are down-going bilaterally. DTRs are symmetrical throughout. Sensory, motor and coordinative functions appear intact. Skin:  Intact without suspicious lesions or rashes Psych:  Cognition and judgment appear intact. Alert and cooperative with normal attention span and concentration. No apparent delusions, illusions, hallucinations   Impression & Recommendations:  Problem # 1:  HYPERTENSION (ICD-401.9) Assessment Comment Only  His updated medication list for this problem includes:    Azor 10-40 Mg Tabs (Amlodipine-olmesartan) .Marland Kitchen... 1 by mouth once daily for blood pressure  Spironolactone 50 Mg Tabs (Spironolactone) .Marland Kitchen... 1 by mouth qam for blood pressure - start to take if BP is up  Problem # 2:  HEADACHE (ICD-784.0) resolved Assessment: New  His updated medication list for this problem includes:    Ibuprofen 600 Mg Tabs (Ibuprofen) .Marland Kitchen... 1 by mouth two times a day as needed pain    Vicodin 5-500 Mg Tabs (Hydrocodone-acetaminophen) ..... One tab by mouth two times a day     Aleve 220 Mg Tabs (Naproxen sodium) .Marland Kitchen... 1 as needed  Problem # 3:  SHOULDER PAIN, RIGHT (ICD-719.41) Assessment: Improved  His updated medication list for this problem includes:    Ibuprofen 600 Mg Tabs (Ibuprofen) .Marland Kitchen... 1 by mouth two times a day as needed pain    Vicodin 5-500 Mg Tabs (Hydrocodone-acetaminophen) ..... One tab by mouth two times a day    Aleve 220 Mg Tabs (Naproxen sodium) .Marland Kitchen... 1 as needed  Complete Medication List: 1)  Vitamin B-12 1000 Mcg Tabs (Cyanocobalamin) .... 2 qd 2)  Vitamin D3 1000 Unit Tabs (Cholecalciferol) .Marland Kitchen.. 1 qd 3)  Ibuprofen 600 Mg Tabs (Ibuprofen) .Marland Kitchen.. 1 by mouth two times a day as needed pain 4)  Vicodin 5-500 Mg Tabs (Hydrocodone-acetaminophen) .... One tab by mouth two times a day 5)  Azor 10-40 Mg Tabs (Amlodipine-olmesartan) .Marland Kitchen.. 1 by mouth once daily for blood pressure 6)  Aleve 220 Mg Tabs (Naproxen sodium) .Marland Kitchen.. 1 as needed 7)  Spironolactone 50 Mg Tabs (Spironolactone) .Marland Kitchen.. 1 by mouth qam for blood pressure  Patient Instructions: 1)  Please schedule a follow-up appointment in 3 months well w/labs and vit B12 266.20 v70.0. 2)  Start Spironolactone if BP>150/90 Prescriptions: SPIRONOLACTONE 50 MG TABS (SPIRONOLACTONE) 1 by mouth qam for blood pressure  #30 x 12   Entered and Authorized by:   Tresa Garter MD   Signed by:   Tresa Garter MD on 11/23/2009   Method used:   Print then Give to Patient   RxID:   [id number]

## 2010-06-22 NOTE — Letter (Signed)
Summary: [facility] Instructions  [facility]  [address] [place], [place] [address]   Phone: [phone]  Fax: [phone]       [name] [name]    Sep 27, 1945    MRN: [id number]        Procedure Day Dorna Bloom:  Wednesday 06/28/2010     Arrival Time: 12:30 pm     Procedure Time: 1:30 pm     Location of Procedure:                    _ x_  [facility] (4th Floor)                        PREPARATION FOR COLONOSCOPY WITH MOVIPREP   Starting 5 days prior to your procedure Friday 2/3 do not eat nuts, seeds, popcorn, corn, beans, peas,  salads, or any raw vegetables.  Do not take any fiber supplements (e.g. Metamucil, Citrucel, and Benefiber).  THE DAY BEFORE YOUR PROCEDURE         DATE: Tuesday 2/7  1.  Drink clear liquids the entire day-NO SOLID FOOD  2.  Do not drink anything colored red or purple.  Avoid juices with pulp.  No orange juice.  3.  Drink at least 64 oz. (8 glasses) of fluid/clear liquids during the day to prevent dehydration and help the prep work efficiently.  CLEAR LIQUIDS INCLUDE: Water Jello Ice Popsicles Tea (sugar ok, no milk/cream) Powdered fruit flavored drinks Coffee (sugar ok, no milk/cream) Gatorade Juice: apple, white grape, white cranberry  Lemonade Clear bullion, consomm, broth Carbonated beverages (any kind) Strained chicken noodle soup Hard Candy                             4.  In the morning, mix first dose of MoviPrep solution:    Empty 1 Pouch A and 1 Pouch B into the disposable container    Add lukewarm drinking water to the top line of the container. Mix to dissolve    Refrigerate (mixed solution should be used within 24 hrs)  5.  Begin drinking the prep at 5:00 p.m. The MoviPrep container is divided by 4 marks.   Every 15 minutes drink the solution down to the next mark (approximately 8 oz) until the full liter is complete.   6.  Follow completed prep with 16 oz of clear liquid of your choice (Nothing red  or purple).  Continue to drink clear liquids until bedtime.  7.  Before going to bed, mix second dose of MoviPrep solution:    Empty 1 Pouch A and 1 Pouch B into the disposable container    Add lukewarm drinking water to the top line of the container. Mix to dissolve    Refrigerate  THE DAY OF YOUR PROCEDURE      DATE: Wednesday 2/8  Beginning at 8:30 a.m. (5 hours before procedure):         1. Every 15 minutes, drink the solution down to the next mark (approx 8 oz) until the full liter is complete.  2. Follow completed prep with 16 oz. of clear liquid of your choice.    3. You may drink clear liquids until 11:30 am (2 HOURS BEFORE PROCEDURE).   MEDICATION INSTRUCTIONS  Unless otherwise instructed, you should take regular prescription medications with a small sip of water   as early as possible the morning of your  procedure.         OTHER INSTRUCTIONS  You will need a responsible adult at least 64 years of age to accompany you and drive you home.   This person must remain in the waiting room during your procedure.  Wear loose fitting clothing that is easily removed.  Leave jewelry and other valuables at home.  However, you may wish to bring a book to read or  an iPod/MP3 player to listen to music as you wait for your procedure to start.  Remove all body piercing jewelry and leave at home.  Total time from sign-in until discharge is approximately 2-3 hours.  You should go home directly after your procedure and rest.  You can resume normal activities the  day after your procedure.  The day of your procedure you should not:   Drive   Make legal decisions   Operate machinery   Drink alcohol   Return to work  You will receive specific instructions about eating, activities and medications before you leave.    The above instructions have been reviewed and explained to me by  Wyona Almas RN  June 14, 2010 8:21 AM     I fully understand and can  verbalize these instructions _____________________________ Date _________

## 2010-06-22 NOTE — Miscellaneous (Signed)
Summary: [facility] Previsit/prep  Clinical Lists Changes  Medications: Added new medication of MOVIPREP 100 GM  SOLR (PEG-KCL-NACL-NASULF-NA ASC-C) As per prep instructions. - Signed Rx of MOVIPREP 100 GM  SOLR (PEG-KCL-NACL-NASULF-NA ASC-C) As per prep instructions.;  #1 x 0;  Signed;  Entered by: Wyona Almas RN;  Authorized by: Hilarie Fredrickson MD;  Method used: Electronically to [facility]  [address] [id number] [address] [place], [place]  [address], Ph: [phone] or [phone], Fax: [phone] Observations: Added new observation of ALLERGY REV: Done (06/14/2010 7:54)    Prescriptions: MOVIPREP 100 GM  SOLR (PEG-KCL-NACL-NASULF-NA ASC-C) As per prep instructions.  #1 x 0   Entered by:   Wyona Almas RN   Authorized by:   Hilarie Fredrickson MD   Signed by:   Wyona Almas RN on 06/14/2010   Method used:   [facility]ically to        [facility]  [address] [id number] (retail[facility]       [address]       [place], [place]  [address]       Ph: [phone] or [phone]       Fax: [phone]   RxID:   [id number]

## 2010-06-22 NOTE — Letter (Signed)
Summary: Pre Visit Letter Revised  [facility]  [address] [place], [place] [address]   Phone: [phone]  Fax: [phone]        05/31/2010 MRN: [id number] [facility] [address] [place], [place]  [address]             Procedure Date:  06/28/2010 @ 1:30   Direct colon-Dr. Marina Goodell   Welcome to the Gastroenterology Division at [facility]    You are scheduled to see a nurse for your pre-procedure visit on 06/14/2010 at 8:00am on the 3rd floor at [facility], [address]  We ask that you try to arrive at our office 15 minutes prior to your appointment time to allow for check-in.  Please take a minute to review the attached form.  If you answer "Yes" to one or more of the questions on the first page, we ask that you call the person listed at your earliest opportunity.  If you answer "No" to all of the questions, please complete the rest of the form and bring it to your appointment.    Your nurse visit will consist of discussing your medical and surgical history, your immediate family medical history, and your medications.   If you are unable to list all of your medications on the form, please bring the medication bottles to your appointment and we will list them.  We will need to be aware of both prescribed and over the counter drugs.  We will need to know exact dosage information as well.    Please be prepared to read and sign documents such as consent forms, a financial agreement, and acknowledgement forms.  If necessary, and with your consent, a friend or relative is welcome to sit-in on the nurse visit with you.  Please bring your insurance card so that we may make a copy of it.  If your insurance requires a referral to see a specialist, please bring your referral form from your primary care physician.  No co-pay is required for this nurse visit.     If you cannot keep your appointment, please call [phone] to cancel or reschedule prior to your  appointment date.  This allows [place] the opportunity to schedule an appointment for another patient in need of care.    Thank you for choosing [facility] for your medical needs.  We appreciate the opportunity to care for you.  Please visit [place] at our website  to learn more about our practice.  Sincerely, The Gastroenterology Division

## 2010-06-28 ENCOUNTER — Other Ambulatory Visit (AMBULATORY_SURGERY_CENTER): Payer: BC Managed Care – PPO | Admitting: Internal Medicine

## 2010-06-28 ENCOUNTER — Other Ambulatory Visit: Payer: Self-pay | Admitting: Internal Medicine

## 2010-06-28 DIAGNOSIS — D126 Benign neoplasm of colon, unspecified: Secondary | ICD-10-CM

## 2010-06-28 DIAGNOSIS — Z1211 Encounter for screening for malignant neoplasm of colon: Secondary | ICD-10-CM

## 2010-07-05 ENCOUNTER — Encounter: Payer: Self-pay | Admitting: Internal Medicine

## 2010-07-12 NOTE — Procedures (Signed)
Summary: Colonoscopy   Colonoscopy  Procedure date:  06/28/2010  Findings:      Location:  [facility].   COLONOSCOPY PROCEDURE REPORT  PATIENT:  [name] [name], [name] [name]  MR#:  [id number] BIRTHDATE:   1946/03/21, 64 yrs. old   GENDER:   male ENDOSCOPIST:   Wilhemina Bonito. Eda Keys, MD REF. BY: Linda Hedges. Plotnikov, M.D. PROCEDURE DATE:  06/28/2010 PROCEDURE:  Colonoscopy with snare polypectomy x 1 ASA CLASS:   Class II INDICATIONS: Routine Risk Screening  MEDICATIONS:    Fentanyl 100 mcg IV, Versed 10 mg IV, Benadryl 25 mg IV  DESCRIPTION OF PROCEDURE:   After the risks benefits and alternatives of the procedure were thoroughly explained, informed consent was obtained.  Digital rectal exam was performed and revealed no abnormalities.   The LB 180AL [id number] endoscope was introduced through the anus and advanced to the cecum, which was identified by both the appendix and ileocecal valve, without limitations.Time to cecum = 2:49 min.  The quality of the prep was excellent, using MoviPrep.  The instrument was then slowly withdrawn (time = 13:29 min) as the colon was fully examined. <<PROCEDUREIMAGES>>        <<OLD IMAGES>>  FINDINGS:  A diminutive polyp was found in the proximal transverse colon. Polyp was snared without cautery. Retrieval was successful.  Otherwise normal colonoscopy without other polyps, masses, vascular ectasias, or inflammatory changes.   Retroflexed views in the rectum revealed no abnormalities.    The scope was then withdrawn from the patient and the procedure completed.  COMPLICATIONS:   None ENDOSCOPIC IMPRESSION:  1) Diminutive polyp in the proximal transverse colon - removed  2) Otherwise normal colonoscopy   RECOMMENDATIONS:  1) Repeat colonoscopy in 5 years if polyp adenomatous; otherwise 10 years  _______________________________ Wilhemina Bonito. Eda Keys, MD  CC: Linda Hedges. Plotnikov, MD;  The Patient    Appended Document: Colonoscopy recall 5  yrs     Procedures Next Due Date:    Colonoscopy: 06/2015

## 2010-07-12 NOTE — Letter (Signed)
Summary: Patient Notice- Polyp Results  [facility]  [address] [place], [place] [address]   Phone: [phone]  Fax: [phone]        July 05, 2010 MRN: [id number]    ARSHIA RONDON [address] [place], [place]  [address]    Dear Mr. REGIS,  I am pleased to inform you that the colon polyp(s) removed during your recent colonoscopy was (were) found to be benign (no cancer detected) upon pathologic examination.  I recommend you have a repeat colonoscopy examination in 5 years to look for recurrent polyps, as having colon polyps increases your risk for having recurrent polyps or even colon cancer in the future.  Should you develop new or worsening symptoms of abdominal pain, bowel habit changes or bleeding from the rectum or bowels, please schedule an evaluation with either your primary care physician or with me.    Additional information/recommendations:  __ No further action with gastroenterology is needed at this time. Please      follow-up with your primary care physician for your other healthcare      needs.    Please call us if you are having persistent problems or have questions about your condition that have not been fully answered at this time.  Sincerely,  Hilarie Fredrickson MD  This letter has been electronically signed by your physician.  Appended Document: Patient Notice- Polyp Results letter mailed  Appended Document: Patient Notice- Polyp Results letter mailed

## 2010-08-16 ENCOUNTER — Encounter: Payer: Self-pay | Admitting: Internal Medicine

## 2010-08-16 ENCOUNTER — Ambulatory Visit (INDEPENDENT_AMBULATORY_CARE_PROVIDER_SITE_OTHER): Payer: BC Managed Care – PPO | Admitting: Internal Medicine

## 2010-08-16 DIAGNOSIS — M545 Low back pain, unspecified: Secondary | ICD-10-CM

## 2010-08-16 DIAGNOSIS — N400 Enlarged prostate without lower urinary tract symptoms: Secondary | ICD-10-CM

## 2010-08-16 DIAGNOSIS — E538 Deficiency of other specified B group vitamins: Secondary | ICD-10-CM

## 2010-08-16 DIAGNOSIS — I10 Essential (primary) hypertension: Secondary | ICD-10-CM

## 2010-08-16 DIAGNOSIS — Z0000 Encounter for general adult medical examination without abnormal findings: Secondary | ICD-10-CM

## 2010-08-16 MED ORDER — SILODOSIN 8 MG PO CAPS: 1.0000 | ORAL_CAPSULE | Freq: Every day | ORAL | Status: DC

## 2010-08-16 MED ORDER — CYANOCOBALAMIN 1000 MCG/ML IJ SOLN
1000.0000 ug | Freq: Once | INTRAMUSCULAR | Status: AC
  Administered 2010-08-16: 1000 ug via INTRAMUSCULAR

## 2010-08-16 NOTE — Assessment & Plan Note (Addendum)
Doing well - stiffness. Use Aleve prn

## 2010-08-16 NOTE — Progress Notes (Signed)
  Subjective:    Patient ID: [name] [name], male    DOB: 1945-09-03, 65 y.o.   MRN: [id number]  HPI The patient presents for a follow-up of  chronic hypertension, chronic dyslipidemia, B12 deficiency controlled with medicines     Review of Systems  Constitutional: Negative for fatigue.  HENT: Positive for congestion.   Eyes: Negative for pain and visual disturbance.  Respiratory: Negative for cough.   Genitourinary: Positive for urgency. Negative for dysuria.  Musculoskeletal: Positive for arthralgias.  Skin: Negative for rash.  Neurological: Negative for dizziness.  Psychiatric/Behavioral: Negative for agitation.       Objective:   Physical Exam  Constitutional: No distress.  Cardiovascular: Exam reveals no friction rub.   Abdominal: He exhibits no distension.  Lymphadenopathy:    He has no cervical adenopathy.  Neurological: Coordination normal.  Skin: No rash noted.  Psychiatric: He has a normal mood and affect. His behavior is normal. Judgment and thought content normal.          Assessment & Plan:  B12 DEFICIENCY Cont with po meds  BENIGN PROSTATIC HYPERTROPHY Options to treat were discussed  LOW BACK PAIN Doing well - stiffness. Use Aleve prn   HYPERTENSION BP is elevated - nl at home

## 2010-08-16 NOTE — Assessment & Plan Note (Addendum)
Options to treat were discussed

## 2010-08-16 NOTE — Assessment & Plan Note (Addendum)
Cont with po meds

## 2010-08-16 NOTE — Assessment & Plan Note (Addendum)
BP is elevated - nl at home

## 2010-08-25 ENCOUNTER — Encounter: Payer: Self-pay | Admitting: Internal Medicine

## 2010-08-25 NOTE — Progress Notes (Signed)
  Subjective:    Patient ID: [name] [name], male    DOB: 1945-09-12, 65 y.o.   MRN: [id number]  HPI  The patient presents for a follow-up of  chronic hypertension, chronic dyslipidemia,BPH, LBP controlled with medicines    Review of Systems  Constitutional: Negative for activity change and fatigue.  HENT: Negative for sneezing.   Eyes: Negative for pain.  Respiratory: Negative for wheezing.   Cardiovascular: Negative for chest pain.  Genitourinary: Negative for dysuria, urgency and testicular pain.  Musculoskeletal: Positive for back pain. Negative for joint swelling.  Skin: Negative for rash.  Neurological: Negative for syncope.  Psychiatric/Behavioral: Negative for dysphoric mood.       Objective:   Physical Exam  Constitutional: He is oriented to person, place, and time. He appears well-developed.  HENT:  Mouth/Throat: Oropharynx is clear and moist.  Eyes: Conjunctivae are normal. Pupils are equal, round, and reactive to light.  Neck: Normal range of motion. No JVD present. No thyromegaly present.  Cardiovascular: Normal rate, regular rhythm, normal heart sounds and intact distal pulses.  Exam reveals no gallop and no friction rub.   No murmur heard. Pulmonary/Chest: Effort normal and breath sounds normal. No respiratory distress. He has no wheezes. He has no rales. He exhibits no tenderness.  Abdominal: Soft. Bowel sounds are normal. He exhibits no distension and no mass. There is no tenderness. There is no rebound and no guarding.  Musculoskeletal: Normal range of motion. He exhibits no edema and no tenderness.  Lymphadenopathy:    He has no cervical adenopathy.  Neurological: He is alert and oriented to person, place, and time. He has normal reflexes. No cranial nerve deficit. He exhibits normal muscle tone. Coordination normal.  Skin: Skin is warm and dry. No rash noted.  Psychiatric: He has a normal mood and affect. His behavior is normal. Judgment and thought content  normal.          Assessment & Plan:  B12 DEFICIENCY Cont with po meds  BENIGN PROSTATIC HYPERTROPHY Options to treat were discussed  LOW BACK PAIN Doing well - stiffness. Use Aleve prn   HYPERTENSION BP is elevated - nl at home

## 2010-08-28 ENCOUNTER — Other Ambulatory Visit: Payer: Self-pay | Admitting: Internal Medicine

## 2010-08-28 NOTE — Telephone Encounter (Signed)
OK to fill this prescription with additional refills x 12 months Thank you!

## 2010-08-29 MED ORDER — AMLODIPINE-OLMESARTAN 10-40 MG PO TABS: 1.0000 | ORAL_TABLET | Freq: Every day | ORAL | Status: DC

## 2010-10-06 NOTE — Assessment & Plan Note (Signed)
[facility]                           PRIMARY CARE OFFICE NOTE   NAME:[name] [name] TANEY                        MRN:          [id number]  DATE:06/12/2006                            DOB:          [birth year]    The patient is a 65 year old male who presents for a wellness  examination.   PAST MEDICAL HISTORY/FAMILY HISTORY/SOCIAL HISTORY:  As per August 25, 2001 note.   ALLERGIES:  No known drug allergies.   CURRENT MEDICATIONS:  Reviewed with the patient in detail.   REVIEW OF SYSTEMS:  Continues to smoke very occasionally.  No chest  pain.  No shortness of breath.  No syncope.  No neurological complaints.  The rest of the 18-point system review is negative, except for some  indigestion.   PHYSICAL:  Blood pressure 141/76, pulse 84, temperature 97.1, weight 211  pounds.  He looks well.  HEENT:  Moist mucosa.  NECK:  Supple.  No thyromegaly or bruits.  LUNGS:  Clear to auscultation and percussion.  No wheeze or rales.  HEART:  S1, S2.  No murmur.  No gallops.  ABDOMEN:  Soft.  Nontender.  No organomegaly or mass felt.  LOWER EXTREMITIES:  Without edema.  He is alert and talkative.  Denies being depressed.  RECTAL:  Reveals normal prostate.  No nodules.  No masses.   LABS:  On May 18, 2006, CBC normal, glucose 108, PSA 0.86,  cholesterol 177, A1c of 5.6%, LDL 123.  Vitamin B12 380.  Marland Kitchen  EKG today  was normal sinus rhythm.   ASSESSMENT AND PLAN:  1. Normal wellness examination.  Age/health related issues discussed.      Healthy life-style discussed.  Stop smoking.  Repeat exam in 12      months.  2. Gastroesophageal reflux disease prescribed Zantac 150 mg daily.  3. Borderline vitamin B12 deficiency.  Take oral B12 1000 mcg daily.  4. Osteoarthritis.  Continue on p.r.n. antiinflammatories.  5. Hypertension controlled.  Continue current therapy.  6. Upper respiratory infection, amoxicillin 500 p.o. b.i.d. 2 tablets      10 days.     Georgina Quint. Plotnikov, MD  Electronically Signed    AVP/MedQ  DD: 06/20/2006  DT: 06/20/2006  Job #: [id number]

## 2010-12-08 ENCOUNTER — Other Ambulatory Visit: Payer: Self-pay | Admitting: Internal Medicine

## 2011-02-14 ENCOUNTER — Ambulatory Visit (INDEPENDENT_AMBULATORY_CARE_PROVIDER_SITE_OTHER): Payer: BC Managed Care – PPO | Admitting: Internal Medicine

## 2011-02-14 ENCOUNTER — Encounter: Payer: Self-pay | Admitting: Internal Medicine

## 2011-02-14 DIAGNOSIS — N400 Enlarged prostate without lower urinary tract symptoms: Secondary | ICD-10-CM

## 2011-02-14 DIAGNOSIS — E538 Deficiency of other specified B group vitamins: Secondary | ICD-10-CM

## 2011-02-14 DIAGNOSIS — R350 Frequency of micturition: Secondary | ICD-10-CM

## 2011-02-14 DIAGNOSIS — J069 Acute upper respiratory infection, unspecified: Secondary | ICD-10-CM

## 2011-02-14 DIAGNOSIS — I10 Essential (primary) hypertension: Secondary | ICD-10-CM

## 2011-02-14 MED ORDER — TADALAFIL 5 MG PO TABS: 5.0000 mg | ORAL_TABLET | Freq: Every day | ORAL | Status: DC | PRN

## 2011-02-14 MED ORDER — AMLODIPINE-OLMESARTAN 10-40 MG PO TABS: 1.0000 | ORAL_TABLET | Freq: Every day | ORAL | Status: DC

## 2011-02-14 MED ORDER — AZITHROMYCIN 250 MG PO TABS: ORAL_TABLET | ORAL | Status: AC

## 2011-02-14 MED ORDER — SILODOSIN 8 MG PO CAPS: 8.0000 mg | ORAL_CAPSULE | Freq: Every day | ORAL | Status: DC

## 2011-02-14 NOTE — Assessment & Plan Note (Signed)
Zpac 

## 2011-02-14 NOTE — Progress Notes (Signed)
  Subjective:    Patient ID: [name] [name], male    DOB: January 01, 1946, 65 y.o.   MRN: [id number]  HPI  HPI  C/o URI sx's x   14 days. C/o ST, cough, weakness. Not better with OTC medicines. Actually, the patient is getting worse. The patient did not sleep last night due to cough.  Review of Systems  Constitutional: Positive for fever, chills and fatigue.  HENT: Positive for congestion, rhinorrhea, sneezing and postnasal drip.   Eyes: Positive for photophobia and pain. Negative for discharge and visual disturbance.  Respiratory: Positive for cough and wheezing.   Positive for chest pain.  Gastrointestinal: Negative for vomiting, abdominal pain, diarrhea and abdominal distention.  Genitourinary: Negative for dysuria and difficulty urinating.  Skin: Negative for rash.  Neurological: Positive for dizziness, weakness and light-headedness.    The patient presents for a follow-up of  chronic hypertension, chronic dyslipidemia,  B12 def controlled with medicines     Review of Systems     Objective:   Physical Exam  Constitutional: He is oriented to person, place, and time. He appears well-developed.  HENT:  Mouth/Throat: Oropharynx is clear and moist.       eryth throat  Eyes: Conjunctivae are normal. Pupils are equal, round, and reactive to light.  Neck: Normal range of motion. No JVD present. No thyromegaly present.  Cardiovascular: Normal rate, regular rhythm, normal heart sounds and intact distal pulses.  Exam reveals no gallop and no friction rub.   No murmur heard. Pulmonary/Chest: Effort normal and breath sounds normal. No respiratory distress. He has no wheezes. He has no rales. He exhibits no tenderness.  Abdominal: Soft. Bowel sounds are normal. He exhibits no distension and no mass. There is no tenderness. There is no rebound and no guarding.  Musculoskeletal: Normal range of motion. He exhibits no edema and no tenderness.  Lymphadenopathy:    He has no cervical adenopathy.    Neurological: He is alert and oriented to person, place, and time. He has normal reflexes. No cranial nerve deficit. He exhibits normal muscle tone. Coordination normal.  Skin: Skin is warm and dry. No rash noted.  Psychiatric: He has a normal mood and affect. His behavior is normal. Judgment and thought content normal.          Assessment & Plan:

## 2011-02-14 NOTE — Assessment & Plan Note (Signed)
Continue with current prescription therapy as reflected on the Med list.  

## 2011-02-14 NOTE — Assessment & Plan Note (Signed)
Continue with current prescription therapy as reflected on the Med list. BP is ok at home 

## 2011-02-14 NOTE — Assessment & Plan Note (Signed)
Restart Rapaflo

## 2011-08-15 ENCOUNTER — Encounter: Payer: Self-pay | Admitting: Internal Medicine

## 2011-08-15 ENCOUNTER — Ambulatory Visit (INDEPENDENT_AMBULATORY_CARE_PROVIDER_SITE_OTHER): Payer: BC Managed Care – PPO | Admitting: Internal Medicine

## 2011-08-15 VITALS — BP 158/100 | HR 76 | Temp 97.5°F | Resp 16 | Wt 219.0 lb

## 2011-08-15 DIAGNOSIS — S060XAA Concussion with loss of consciousness status unknown, initial encounter: Secondary | ICD-10-CM | POA: Insufficient documentation

## 2011-08-15 DIAGNOSIS — I10 Essential (primary) hypertension: Secondary | ICD-10-CM

## 2011-08-15 DIAGNOSIS — E538 Deficiency of other specified B group vitamins: Secondary | ICD-10-CM

## 2011-08-15 DIAGNOSIS — W010XXA Fall on same level from slipping, tripping and stumbling without subsequent striking against object, initial encounter: Secondary | ICD-10-CM

## 2011-08-15 DIAGNOSIS — M545 Low back pain, unspecified: Secondary | ICD-10-CM

## 2011-08-15 DIAGNOSIS — M542 Cervicalgia: Secondary | ICD-10-CM

## 2011-08-15 DIAGNOSIS — R51 Headache: Secondary | ICD-10-CM

## 2011-08-15 DIAGNOSIS — S060X9A Concussion with loss of consciousness of unspecified duration, initial encounter: Secondary | ICD-10-CM

## 2011-08-15 NOTE — Assessment & Plan Note (Signed)
Continue with current prescription therapy as reflected on the Med list.  

## 2011-08-15 NOTE — Patient Instructions (Signed)
Check BP at home  Nl BP<130/85

## 2011-08-15 NOTE — Assessment & Plan Note (Addendum)
Continue with current prescription therapy as reflected on the Med list: he was not taking spironolactone Check BP 2/wk and bring records If elev - call

## 2011-08-15 NOTE — Assessment & Plan Note (Signed)
3/13 MSK strain In PT

## 2011-08-15 NOTE — Assessment & Plan Note (Signed)
3/13 clinically concussion Discussed

## 2011-08-15 NOTE — Assessment & Plan Note (Signed)
07/23/11 at work MSK pain LS and Cspine Mild concussion  Workman's comp In PT now

## 2011-08-15 NOTE — Assessment & Plan Note (Signed)
3/13 post-concussion

## 2011-08-15 NOTE — Progress Notes (Signed)
Patient ID: [name] [name], male   DOB: 24-Apr-1946, 66 y.o.   MRN: [id number]  Subjective:    Patient ID: [name] [name], male    DOB: 26-Jun-1945, 66 y.o.   MRN: [id number]  Neck Pain  Pertinent negatives include no chest pain.  Back Pain Pertinent negatives include no chest pain or dysuria.  Groin Pain The patient's pertinent negatives include no testicular pain. Pertinent negatives include no chest pain, dysuria, rash or urgency.   C/o fall at work 07/23/11 -- fell hard on the ramp (slipped); fell on R side feet up in the air - he was talking funny following the fall and had a HA lasting al day, no LOC. He has been having HAs. The patient presents for a follow-up of  chronic hypertension, chronic dyslipidemia,BPH, LBP controlled with medicines  Wt Readings from Last 3 Encounters:  08/15/11 219 lb (99.338 kg)  02/14/11 212 lb (96.163 kg)  08/16/10 211 lb (95.709 kg)   BP Readings from Last 3 Encounters:  08/15/11 158/100  02/14/11 148/98  08/16/10 150/88      Review of Systems  Constitutional: Negative for activity change and fatigue.  HENT: Positive for neck pain. Negative for sneezing.   Eyes: Negative for pain.  Respiratory: Negative for wheezing.   Cardiovascular: Negative for chest pain.  Genitourinary: Negative for dysuria, urgency and testicular pain.  Musculoskeletal: Positive for back pain. Negative for joint swelling.  Skin: Negative for rash.  Neurological: Negative for syncope.  Psychiatric/Behavioral: Negative for dysphoric mood.       Objective:   Physical Exam  Constitutional: He is oriented to person, place, and time. He appears well-developed.  HENT:  Mouth/Throat: Oropharynx is clear and moist.  Eyes: Conjunctivae are normal. Pupils are equal, round, and reactive to light.  Neck: Normal range of motion. No JVD present. No thyromegaly present.  Cardiovascular: Normal rate, regular rhythm, normal heart sounds and intact distal pulses.  Exam reveals no gallop  and no friction rub.   No murmur heard. Pulmonary/Chest: Effort normal and breath sounds normal. No respiratory distress. He has no wheezes. He has no rales. He exhibits no tenderness.  Abdominal: Soft. Bowel sounds are normal. He exhibits no distension and no mass. There is no tenderness. There is no rebound and no guarding.  Musculoskeletal: Normal range of motion. He exhibits tenderness. He exhibits no edema.       R buttock, R hip, B cerv paraspinal muscles are tender Decr LS and cerv spine ROM  Lymphadenopathy:    He has no cervical adenopathy.  Neurological: He is alert and oriented to person, place, and time. He has normal reflexes. No cranial nerve deficit. He exhibits normal muscle tone. Coordination normal.  Skin: Skin is warm and dry. No rash noted.  Psychiatric: He has a normal mood and affect. His behavior is normal. Judgment and thought content normal.   Lab Results  Component Value Date   WBC 7.3 02/01/2010   HGB 15.1 02/01/2010   HCT 43.7 02/01/2010   PLT 216.0 02/01/2010   GLUCOSE 89 02/01/2010   CHOL 186 02/01/2010   TRIG 26.0 02/01/2010   HDL 47.30 02/01/2010   LDLCALC 134* 02/01/2010   ALT 24 02/01/2010   AST 21 02/01/2010   NA 142 02/01/2010   K 4.4 02/01/2010   CL 106 02/01/2010   CREATININE 0.7 02/01/2010   BUN 14 02/01/2010   CO2 30 02/01/2010   TSH 0.95 02/01/2010   PSA 0.61 02/01/2010   HGBA1C  5.6 05/08/2006          Assessment & Plan:

## 2011-08-15 NOTE — Assessment & Plan Note (Signed)
In PT 

## 2011-08-17 ENCOUNTER — Encounter: Payer: Self-pay | Admitting: Internal Medicine

## 2011-11-21 ENCOUNTER — Encounter: Payer: Self-pay | Admitting: Internal Medicine

## 2011-11-21 ENCOUNTER — Ambulatory Visit (INDEPENDENT_AMBULATORY_CARE_PROVIDER_SITE_OTHER): Payer: BC Managed Care – PPO | Admitting: Internal Medicine

## 2011-11-21 VITALS — BP 170/102 | HR 80 | Temp 98.5°F | Resp 16 | Wt 233.0 lb

## 2011-11-21 DIAGNOSIS — E785 Hyperlipidemia, unspecified: Secondary | ICD-10-CM

## 2011-11-21 DIAGNOSIS — I10 Essential (primary) hypertension: Secondary | ICD-10-CM

## 2011-11-21 DIAGNOSIS — E538 Deficiency of other specified B group vitamins: Secondary | ICD-10-CM

## 2011-11-21 DIAGNOSIS — E550 Rickets, active: Secondary | ICD-10-CM

## 2011-11-21 DIAGNOSIS — W010XXA Fall on same level from slipping, tripping and stumbling without subsequent striking against object, initial encounter: Secondary | ICD-10-CM

## 2011-11-21 MED ORDER — CARVEDILOL 25 MG PO TABS: 25.0000 mg | ORAL_TABLET | Freq: Two times a day (BID) | ORAL | Status: DC

## 2011-11-21 MED ORDER — TRIAMCINOLONE ACETONIDE 0.5 % EX CREA: TOPICAL_CREAM | Freq: Two times a day (BID) | CUTANEOUS | Status: DC | PRN

## 2011-11-21 NOTE — Patient Instructions (Addendum)
Low salt diet  Wt Readings from Last 3 Encounters:  11/21/11 233 lb (105.688 kg)  08/15/11 219 lb (99.338 kg)  02/14/11 212 lb (96.163 kg)   BP Readings from Last 3 Encounters:  11/21/11 170/102  08/15/11 158/100  02/14/11 148/98

## 2011-11-21 NOTE — Assessment & Plan Note (Signed)
Continue with current prescription therapy as reflected on the Med list.  

## 2011-11-21 NOTE — Progress Notes (Signed)
Subjective:    Patient ID: [name] [name], male    DOB: 05-21-1946, 66 y.o.   MRN: [id number]  Neck Pain  This is a chronic problem. The current episode started more than 1 month ago. The problem occurs constantly. The problem has been gradually worsening. The pain is associated with a fall. The pain is present in the right side. The quality of the pain is described as aching and burning. The pain is at a severity of 5/10. The pain is moderate. The symptoms are aggravated by bending, position and twisting. The pain is worse during the night. Stiffness is present in the morning. Associated symptoms include headaches. Pertinent negatives include no chest pain. The treatment provided mild relief.  Back Pain This is a chronic problem. The current episode started more than 1 month ago. The problem occurs constantly. The problem is unchanged. The pain is present in the gluteal and sacro-iliac. The quality of the pain is described as aching. The pain radiates to the right thigh. The pain is at a severity of 6/10. The pain is moderate. The pain is worse during the night. The symptoms are aggravated by lying down and bending. Stiffness is present in the morning. Associated symptoms include headaches. Pertinent negatives include no chest pain or dysuria.  Groin Pain The patient's pertinent negatives include no testicular pain. This is a chronic problem. The current episode started more than 1 month ago. The problem has been unchanged. Associated symptoms include headaches. Pertinent negatives include no chest pain, dysuria, rash or urgency. The symptoms are aggravated by activity. The treatment provided mild relief.   F/u fall at work 07/23/11 -- fell hard on the ramp (slipped); fell on R side feet up in the air - he was talking funny following the fall and had a HA lasting al day, no LOC. He has been having HAs. He is seeing Dr Venetia Maxon, getting PT through workman's comp. The patient presents for a follow-up of   chronic hypertension, chronic dyslipidemia,BPH, LBP controlled with medicines  Wt Readings from Last 3 Encounters:  11/21/11 233 lb (105.688 kg)  08/15/11 219 lb (99.338 kg)  02/14/11 212 lb (96.163 kg)   BP Readings from Last 3 Encounters:  11/21/11 170/102  08/15/11 158/100  02/14/11 148/98      Review of Systems  Constitutional: Negative for activity change and fatigue.  HENT: Positive for neck pain. Negative for sneezing.   Eyes: Negative for pain.  Respiratory: Negative for wheezing.   Cardiovascular: Negative for chest pain.  Genitourinary: Negative for dysuria, urgency and testicular pain.  Musculoskeletal: Positive for back pain. Negative for joint swelling.  Skin: Negative for rash.  Neurological: Positive for headaches. Negative for syncope.  Psychiatric/Behavioral: Negative for dysphoric mood.       Objective:   Physical Exam  Constitutional: He is oriented to person, place, and time. He appears well-developed.  HENT:  Mouth/Throat: Oropharynx is clear and moist.  Eyes: Conjunctivae are normal. Pupils are equal, round, and reactive to light.  Neck: Normal range of motion. No JVD present. No thyromegaly present.  Cardiovascular: Normal rate, regular rhythm, normal heart sounds and intact distal pulses.  Exam reveals no gallop and no friction rub.   No murmur heard. Pulmonary/Chest: Effort normal and breath sounds normal. No respiratory distress. He has no wheezes. He has no rales. He exhibits no tenderness.  Abdominal: Soft. Bowel sounds are normal. He exhibits no distension and no mass. There is no tenderness. There is no rebound  and no guarding.  Musculoskeletal: Normal range of motion. He exhibits tenderness. He exhibits no edema.       R buttock, R hip, B cerv paraspinal muscles are less tender Decr LS and cerv spine ROM due to pain  Lymphadenopathy:    He has no cervical adenopathy.  Neurological: He is alert and oriented to person, place, and time. He has  normal reflexes. No cranial nerve deficit. He exhibits normal muscle tone. Coordination normal.  Skin: Skin is warm and dry. No rash noted.  Psychiatric: He has a normal mood and affect. His behavior is normal. Judgment and thought content normal.   Lab Results  Component Value Date   WBC 7.3 02/01/2010   HGB 15.1 02/01/2010   HCT 43.7 02/01/2010   PLT 216.0 02/01/2010   GLUCOSE 89 02/01/2010   CHOL 186 02/01/2010   TRIG 26.0 02/01/2010   HDL 47.30 02/01/2010   LDLCALC 134* 02/01/2010   ALT 24 02/01/2010   AST 21 02/01/2010   NA 142 02/01/2010   K 4.4 02/01/2010   CL 106 02/01/2010   CREATININE 0.7 02/01/2010   BUN 14 02/01/2010   CO2 30 02/01/2010   TSH 0.95 02/01/2010   PSA 0.61 02/01/2010   HGBA1C 5.6 05/08/2006          Assessment & Plan:

## 2011-11-21 NOTE — Assessment & Plan Note (Signed)
Re-start B12 

## 2011-11-28 ENCOUNTER — Encounter: Payer: Self-pay | Admitting: Internal Medicine

## 2011-11-28 NOTE — Assessment & Plan Note (Signed)
Recovering  

## 2011-11-28 NOTE — Assessment & Plan Note (Signed)
In treatment now through workman's comp

## 2011-12-19 ENCOUNTER — Ambulatory Visit: Payer: BC Managed Care – PPO | Admitting: Internal Medicine

## 2012-02-05 ENCOUNTER — Other Ambulatory Visit: Payer: Self-pay | Admitting: Neurosurgery

## 2012-02-08 ENCOUNTER — Other Ambulatory Visit (INDEPENDENT_AMBULATORY_CARE_PROVIDER_SITE_OTHER): Payer: BC Managed Care – PPO

## 2012-02-08 DIAGNOSIS — E550 Rickets, active: Secondary | ICD-10-CM

## 2012-02-08 DIAGNOSIS — E538 Deficiency of other specified B group vitamins: Secondary | ICD-10-CM

## 2012-02-08 DIAGNOSIS — E785 Hyperlipidemia, unspecified: Secondary | ICD-10-CM

## 2012-02-08 DIAGNOSIS — I10 Essential (primary) hypertension: Secondary | ICD-10-CM

## 2012-02-08 LAB — HEPATIC FUNCTION PANEL
ALT: 25 U/L (ref 0–53)
Albumin: 4.2 g/dL (ref 3.5–5.2)
Total Protein: 7.1 g/dL (ref 6.0–8.3)

## 2012-02-08 LAB — LIPID PANEL
HDL: 42.9 mg/dL (ref >39.00)
Triglycerides: 113 mg/dL (ref 0.0–149.0)

## 2012-02-08 LAB — LDL CHOLESTEROL, DIRECT: Direct LDL: 134 mg/dL

## 2012-02-12 ENCOUNTER — Telehealth: Payer: Self-pay | Admitting: Internal Medicine

## 2012-02-12 MED ORDER — ERGOCALCIFEROL 1.25 MG (50000 UT) PO CAPS: 50000.0000 [IU] | ORAL_CAPSULE | ORAL | Status: DC

## 2012-02-12 NOTE — Telephone Encounter (Signed)
Patient notified

## 2012-02-12 NOTE — Telephone Encounter (Signed)
[name] [name], please, inform patient that he has low it D Start Vit D Thx

## 2012-02-20 ENCOUNTER — Ambulatory Visit (INDEPENDENT_AMBULATORY_CARE_PROVIDER_SITE_OTHER): Payer: BC Managed Care – PPO | Admitting: Internal Medicine

## 2012-02-20 ENCOUNTER — Encounter: Payer: Self-pay | Admitting: Internal Medicine

## 2012-02-20 ENCOUNTER — Other Ambulatory Visit: Payer: Self-pay | Admitting: Internal Medicine

## 2012-02-20 VITALS — BP 150/88 | HR 80 | Temp 98.1°F | Resp 16 | Wt 220.0 lb

## 2012-02-20 DIAGNOSIS — M545 Low back pain, unspecified: Secondary | ICD-10-CM

## 2012-02-20 DIAGNOSIS — M542 Cervicalgia: Secondary | ICD-10-CM

## 2012-02-20 DIAGNOSIS — E538 Deficiency of other specified B group vitamins: Secondary | ICD-10-CM

## 2012-02-20 DIAGNOSIS — I10 Essential (primary) hypertension: Secondary | ICD-10-CM

## 2012-02-20 MED ORDER — AMLODIPINE-OLMESARTAN 10-40 MG PO TABS: 1.0000 | ORAL_TABLET | Freq: Every day | ORAL | Status: DC

## 2012-02-20 MED ORDER — CARVEDILOL 25 MG PO TABS: 25.0000 mg | ORAL_TABLET | Freq: Two times a day (BID) | ORAL | Status: DC

## 2012-02-20 MED ORDER — CYANOCOBALAMIN 1000 MCG/ML IJ SOLN
1000.0000 ug | Freq: Once | INTRAMUSCULAR | Status: AC
  Administered 2012-02-20: 1000 ug via INTRAMUSCULAR

## 2012-02-20 NOTE — Assessment & Plan Note (Signed)
Re-start B12 

## 2012-02-20 NOTE — Progress Notes (Signed)
Patient ID: [name] [name], male   DOB: [birth year], 66 y.o.   MRN: [id number]   Subjective:    Patient ID: [name] [name], male    DOB: August 21, 1945, 66 y.o.   MRN: [id number]  Neck Pain  This is a chronic problem. The current episode started more than 1 month ago. The problem occurs constantly. The problem has been gradually worsening. The pain is associated with a fall. The pain is present in the right side. The quality of the pain is described as aching and burning. The pain is at a severity of 5/10. The pain is moderate. The symptoms are aggravated by bending, position and twisting. The pain is worse during the night. Stiffness is present in the morning. Associated symptoms include headaches. Pertinent negatives include no chest pain. The treatment provided mild relief.  Back Pain This is a chronic problem. The current episode started more than 1 month ago. The problem occurs constantly. The problem is unchanged. The pain is present in the gluteal and sacro-iliac. The quality of the pain is described as aching. The pain radiates to the right thigh. The pain is at a severity of 6/10. The pain is moderate. The pain is worse during the night. The symptoms are aggravated by lying down and bending. Stiffness is present in the morning. Associated symptoms include headaches. Pertinent negatives include no chest pain or dysuria.  Groin Pain The patient's pertinent negatives include no testicular pain. This is a chronic problem. The current episode started more than 1 month ago. The problem has been unchanged. Associated symptoms include headaches. Pertinent negatives include no chest pain, dysuria, rash or urgency. The symptoms are aggravated by activity. The treatment provided mild relief.   F/u fall at work 07/23/11 -- fell hard on the ramp (slipped); fell on R side feet up in the air - he was talking funny following the fall and had a HA lasting al day, no LOC. He has been having HAs. He is seeing Dr Venetia Maxon,  getting PT through workman's comp.  LS spine surgery is pending 10/18 Dr Venetia Maxon  The patient presents for a follow-up of  chronic hypertension, chronic dyslipidemia, BPH. He stopped Azor for ?reason  Wt Readings from Last 3 Encounters:  02/20/12 220 lb (99.791 kg)  11/21/11 233 lb (105.688 kg)  08/15/11 219 lb (99.338 kg)   BP Readings from Last 3 Encounters:  02/20/12 150/88  11/21/11 170/102  08/15/11 158/100      Review of Systems  Constitutional: Negative for activity change and fatigue.  HENT: Positive for neck pain. Negative for sneezing.   Eyes: Negative for pain.  Respiratory: Negative for wheezing.   Cardiovascular: Negative for chest pain.  Genitourinary: Negative for dysuria, urgency and testicular pain.  Musculoskeletal: Positive for back pain. Negative for joint swelling.  Skin: Negative for rash.  Neurological: Positive for headaches. Negative for syncope.  Psychiatric/Behavioral: Negative for dysphoric mood.       Objective:   Physical Exam  Constitutional: He is oriented to person, place, and time. He appears well-developed.  HENT:  Mouth/Throat: Oropharynx is clear and moist.  Eyes: Conjunctivae normal are normal. Pupils are equal, round, and reactive to light.  Neck: Normal range of motion. No JVD present. No thyromegaly present.  Cardiovascular: Normal rate, regular rhythm, normal heart sounds and intact distal pulses.  Exam reveals no gallop and no friction rub.   No murmur heard. Pulmonary/Chest: Effort normal and breath sounds normal. No respiratory distress. He has no wheezes.  He has no rales. He exhibits no tenderness.  Abdominal: Soft. Bowel sounds are normal. He exhibits no distension and no mass. There is no tenderness. There is no rebound and no guarding.  Musculoskeletal: Normal range of motion. He exhibits tenderness. He exhibits no edema.       R buttock, R hip, B cerv paraspinal muscles are less tender Decr LS and cerv spine ROM due to pain   Lymphadenopathy:    He has no cervical adenopathy.  Neurological: He is alert and oriented to person, place, and time. He has normal reflexes. No cranial nerve deficit. He exhibits normal muscle tone. Coordination normal.  Skin: Skin is warm and dry. No rash noted.  Psychiatric: He has a normal mood and affect. His behavior is normal. Judgment and thought content normal.   Lab Results  Component Value Date   WBC 7.3 02/01/2010   HGB 15.1 02/01/2010   HCT 43.7 02/01/2010   PLT 216.0 02/01/2010   GLUCOSE 89 02/01/2010   CHOL 203* 02/08/2012   TRIG 113.0 02/08/2012   HDL 42.90 02/08/2012   LDLDIRECT 134.0 02/08/2012   LDLCALC 134* 02/01/2010   ALT 25 02/08/2012   AST 19 02/08/2012   NA 142 02/01/2010   K 4.4 02/01/2010   CL 106 02/01/2010   CREATININE 0.7 02/01/2010   BUN 14 02/01/2010   CO2 30 02/01/2010   TSH 2.35 02/08/2012   PSA 0.61 02/01/2010   HGBA1C 5.6 05/08/2006          Assessment & Plan:

## 2012-02-20 NOTE — Assessment & Plan Note (Signed)
LS spine surgery is pending 10/18 Dr Venetia Maxon

## 2012-02-20 NOTE — Assessment & Plan Note (Signed)
He has to re-start Azor and cont Coreg LS spine surgery is pending 10/18 Dr Venetia Maxon

## 2012-02-20 NOTE — Assessment & Plan Note (Signed)
Not better Continue with current prescription therapy as reflected on the Med list.

## 2012-02-25 ENCOUNTER — Encounter (HOSPITAL_COMMUNITY): Payer: Self-pay | Admitting: Pharmacy Technician

## 2012-02-27 ENCOUNTER — Encounter (HOSPITAL_COMMUNITY): Payer: Self-pay

## 2012-02-27 ENCOUNTER — Encounter (HOSPITAL_COMMUNITY)
Admission: RE | Admit: 2012-02-27 | Discharge: 2012-02-27 | Disposition: A | Discharge status: Home / Self Care | Payer: Worker's Compensation | Source: Ambulatory Visit | Attending: Neurosurgery | Admitting: Neurosurgery

## 2012-02-27 HISTORY — DX: Other specified postprocedural states: Z98.890

## 2012-02-27 HISTORY — DX: Cardiac murmur, unspecified: R01.1

## 2012-02-27 HISTORY — DX: Other specified postprocedural states: R11.2

## 2012-02-27 HISTORY — DX: Unspecified osteoarthritis, unspecified site: M19.90

## 2012-02-27 LAB — ABO/RH: ABO/RH(D): AB POS

## 2012-02-27 LAB — TYPE AND SCREEN
ABO/RH(D): AB POS
Antibody Screen: NEGATIVE

## 2012-02-27 LAB — BASIC METABOLIC PANEL
Calcium: 9.9 mg/dL (ref 8.4–10.5)
GFR calc non Af Amer: >90 mL/min (ref >90)
Sodium: 139 mEq/L (ref 135–145)

## 2012-02-27 LAB — CBC
Platelets: 185 10*3/uL (ref 150–400)
RBC: 5.17 MIL/uL (ref 4.22–5.81)
WBC: 8.4 10*3/uL (ref 4.0–10.5)

## 2012-02-27 LAB — SURGICAL PCR SCREEN: Staphylococcus aureus: NEGATIVE

## 2012-02-27 NOTE — Progress Notes (Signed)
Stress test 06 ? Heart murmur  No visit since on beta blocker

## 2012-02-27 NOTE — Pre-Procedure Instructions (Addendum)
20 LAWERANCE MATSUO  02/27/2012   Your procedure is scheduled on: 03/07/12  Report to [facility] at600 AM.  Call this number if you have problems the morning of surgery: [phone]   Remember:   Do not eat foodor drink:After Midnight.    Take these medicines the morning of surgery with A SIP OF WATER:, carvedilol  STOP meloxicam,  02/29/12   Do not wear jewelry,   Do not wear lotions, powders, or perfumes.   Do not shave 48 hours prior to surgery. Men may shave face and neck.  Do not bring valuables to the hospital.  Contacts, dentures or bridgework may not be worn into surgery.  Leave suitcase in the car. After surgery it may be brought to your room.  For patients admitted to the hospital, checkout time is 11:00 AM the day of discharge.   Patients discharged the day of surgery will not be allowed to drive home.  Name and phone number of your driver: gwyn friend [phone]  Special Instructions: Shower using CHG 2 nights before surgery and the night before surgery.  If you shower the day of surgery use CHG.  Use special wash - you have one bottle of CHG for all showers.  You should use approximately 1/3 of the bottle for each shower.   Please read over the following fact sheets that you were given: Pain Booklet, Coughing and Deep Breathing, Blood Transfusion Information, MRSA Information and Surgical Site Infection Prevention

## 2012-03-06 MED ORDER — CEFAZOLIN SODIUM-DEXTROSE 2-3 GM-% IV SOLR
2.0000 g | INTRAVENOUS | Status: AC
  Administered 2012-03-07: 2 g via INTRAVENOUS
  Filled 2012-03-06: qty 50

## 2012-03-07 ENCOUNTER — Encounter (HOSPITAL_COMMUNITY): Payer: Self-pay | Admitting: Anesthesiology

## 2012-03-07 ENCOUNTER — Ambulatory Visit (HOSPITAL_COMMUNITY): Payer: Worker's Compensation | Admitting: Anesthesiology

## 2012-03-07 ENCOUNTER — Ambulatory Visit (HOSPITAL_COMMUNITY): Payer: Worker's Compensation

## 2012-03-07 ENCOUNTER — Encounter (HOSPITAL_COMMUNITY): Payer: Self-pay

## 2012-03-07 ENCOUNTER — Inpatient Hospital Stay (HOSPITAL_COMMUNITY)
Admission: RE | Admit: 2012-03-07 | Discharge: 2012-03-09 | DRG: 460 | Disposition: A | Discharge status: Home / Self Care | Payer: Worker's Compensation | Source: Ambulatory Visit | Attending: Neurosurgery | Admitting: Neurosurgery

## 2012-03-07 ENCOUNTER — Encounter (HOSPITAL_COMMUNITY)
Admission: RE | Disposition: A | Discharge status: Home / Self Care | Payer: Self-pay | Source: Ambulatory Visit | Attending: Neurosurgery

## 2012-03-07 DIAGNOSIS — M48061 Spinal stenosis, lumbar region without neurogenic claudication: Secondary | ICD-10-CM | POA: Diagnosis present

## 2012-03-07 DIAGNOSIS — I10 Essential (primary) hypertension: Secondary | ICD-10-CM | POA: Diagnosis present

## 2012-03-07 DIAGNOSIS — Y9229 Other specified public building as the place of occurrence of the external cause: Secondary | ICD-10-CM

## 2012-03-07 DIAGNOSIS — IMO0001 Reserved for inherently not codable concepts without codable children: Secondary | ICD-10-CM

## 2012-03-07 DIAGNOSIS — M4830 Traumatic spondylopathy, site unspecified: Secondary | ICD-10-CM | POA: Diagnosis present

## 2012-03-07 DIAGNOSIS — F411 Generalized anxiety disorder: Secondary | ICD-10-CM | POA: Diagnosis present

## 2012-03-07 DIAGNOSIS — W010XXA Fall on same level from slipping, tripping and stumbling without subsequent striking against object, initial encounter: Secondary | ICD-10-CM | POA: Diagnosis present

## 2012-03-07 DIAGNOSIS — Y998 Other external cause status: Secondary | ICD-10-CM

## 2012-03-07 DIAGNOSIS — Z87891 Personal history of nicotine dependence: Secondary | ICD-10-CM

## 2012-03-07 DIAGNOSIS — M4310 Spondylolisthesis, site unspecified: Secondary | ICD-10-CM | POA: Diagnosis present

## 2012-03-07 DIAGNOSIS — S33101A Dislocation of unspecified lumbar vertebra, initial encounter: Principal | ICD-10-CM | POA: Diagnosis present

## 2012-03-07 SURGERY — POSTERIOR LUMBAR FUSION 1 LEVEL
Anesthesia: General | Site: Back | Wound class: Clean

## 2012-03-07 MED ORDER — BACITRACIN 50000 UNITS IM SOLR
INTRAMUSCULAR | Status: AC
  Filled 2012-03-07: qty 1

## 2012-03-07 MED ORDER — PHENOL 1.4 % MT LIQD: 1.0000 | OROMUCOSAL | Status: DC | PRN

## 2012-03-07 MED ORDER — BUPIVACAINE HCL (PF) 0.5 % IJ SOLN
INTRAMUSCULAR | Status: DC | PRN
  Administered 2012-03-07: 5 mL

## 2012-03-07 MED ORDER — POLYETHYLENE GLYCOL 3350 17 G PO PACK
17.0000 g | PACK | Freq: Every day | ORAL | Status: DC | PRN
  Filled 2012-03-07: qty 1

## 2012-03-07 MED ORDER — DIAZEPAM 5 MG PO TABS
5.0000 mg | ORAL_TABLET | Freq: Four times a day (QID) | ORAL | Status: DC | PRN
  Administered 2012-03-07 – 2012-03-09 (×6): 5 mg via ORAL
  Filled 2012-03-07 (×6): qty 1

## 2012-03-07 MED ORDER — DOCUSATE SODIUM 100 MG PO CAPS
100.0000 mg | ORAL_CAPSULE | Freq: Two times a day (BID) | ORAL | Status: DC
  Administered 2012-03-07 – 2012-03-09 (×5): 100 mg via ORAL
  Filled 2012-03-07 (×4): qty 1

## 2012-03-07 MED ORDER — SODIUM CHLORIDE 0.9 % IJ SOLN
3.0000 mL | Freq: Two times a day (BID) | INTRAMUSCULAR | Status: DC
  Administered 2012-03-08 (×2): 3 mL via INTRAVENOUS

## 2012-03-07 MED ORDER — AMLODIPINE-OLMESARTAN 10-40 MG PO TABS: 1.0000 | ORAL_TABLET | Freq: Every day | ORAL | Status: DC

## 2012-03-07 MED ORDER — FENTANYL CITRATE 0.05 MG/ML IJ SOLN
INTRAMUSCULAR | Status: DC | PRN
  Administered 2012-03-07 (×2): 50 ug via INTRAVENOUS
  Administered 2012-03-07 (×2): 25 ug via INTRAVENOUS
  Administered 2012-03-07: 50 ug via INTRAVENOUS

## 2012-03-07 MED ORDER — SODIUM CHLORIDE 0.9 % IJ SOLN: 3.0000 mL | INTRAMUSCULAR | Status: DC | PRN

## 2012-03-07 MED ORDER — THROMBIN 20000 UNITS EX KIT
PACK | CUTANEOUS | Status: DC | PRN
  Administered 2012-03-07: 20000 [IU] via TOPICAL

## 2012-03-07 MED ORDER — LACTATED RINGERS IV SOLN
INTRAVENOUS | Status: DC | PRN
  Administered 2012-03-07 (×3): via INTRAVENOUS

## 2012-03-07 MED ORDER — ONDANSETRON HCL 4 MG/2ML IJ SOLN: 4.0000 mg | Freq: Four times a day (QID) | INTRAMUSCULAR | Status: DC | PRN

## 2012-03-07 MED ORDER — ONDANSETRON HCL 4 MG/2ML IJ SOLN
INTRAMUSCULAR | Status: DC | PRN
  Administered 2012-03-07: 4 mg via INTRAVENOUS

## 2012-03-07 MED ORDER — LIDOCAINE-EPINEPHRINE 1 %-1:100000 IJ SOLN
INTRAMUSCULAR | Status: DC | PRN
  Administered 2012-03-07: 5 mL via INTRADERMAL

## 2012-03-07 MED ORDER — CHOLECALCIFEROL 25 MCG (1000 UT) PO TABS: 1000.0000 [IU] | ORAL_TABLET | Freq: Every day | ORAL | Status: DC

## 2012-03-07 MED ORDER — OXYCODONE-ACETAMINOPHEN 5-325 MG PO TABS
1.0000 | ORAL_TABLET | ORAL | Status: DC | PRN
  Administered 2012-03-07 – 2012-03-09 (×8): 2 via ORAL
  Filled 2012-03-07 (×8): qty 2

## 2012-03-07 MED ORDER — SODIUM CHLORIDE 0.9 % IV SOLN: 250.0000 mL | INTRAVENOUS | Status: DC

## 2012-03-07 MED ORDER — ERGOCALCIFEROL 1.25 MG (50000 UT) PO CAPS: 50000.0000 [IU] | ORAL_CAPSULE | ORAL | Status: DC

## 2012-03-07 MED ORDER — HEMOSTATIC AGENTS (NO CHARGE) OPTIME
TOPICAL | Status: DC | PRN
  Administered 2012-03-07: 1 via TOPICAL

## 2012-03-07 MED ORDER — ACETAMINOPHEN 325 MG PO TABS: 650.0000 mg | ORAL_TABLET | ORAL | Status: DC | PRN

## 2012-03-07 MED ORDER — HYDROMORPHONE HCL PF 1 MG/ML IJ SOLN
INTRAMUSCULAR | Status: AC
  Filled 2012-03-07: qty 1

## 2012-03-07 MED ORDER — 0.9 % SODIUM CHLORIDE (POUR BTL) OPTIME
TOPICAL | Status: DC | PRN
  Administered 2012-03-07: 1000 mL

## 2012-03-07 MED ORDER — VITAMIN B-12 1000 MCG PO TABS
2000.0000 ug | ORAL_TABLET | Freq: Every day | ORAL | Status: DC
  Administered 2012-03-08 – 2012-03-09 (×2): 2000 ug via ORAL
  Filled 2012-03-07 (×2): qty 2

## 2012-03-07 MED ORDER — AMLODIPINE BESYLATE 10 MG PO TABS
10.0000 mg | ORAL_TABLET | Freq: Every day | ORAL | Status: DC
  Administered 2012-03-07 – 2012-03-09 (×3): 10 mg via ORAL
  Filled 2012-03-07 (×3): qty 1

## 2012-03-07 MED ORDER — ZOLPIDEM TARTRATE 5 MG PO TABS: 5.0000 mg | ORAL_TABLET | Freq: Every evening | ORAL | Status: DC | PRN

## 2012-03-07 MED ORDER — MORPHINE SULFATE (PF) 1 MG/ML IV SOLN: INTRAVENOUS | Status: DC

## 2012-03-07 MED ORDER — ACETAMINOPHEN 650 MG RE SUPP: 650.0000 mg | RECTAL | Status: DC | PRN

## 2012-03-07 MED ORDER — HYDROMORPHONE HCL PF 1 MG/ML IJ SOLN
0.2500 mg | INTRAMUSCULAR | Status: DC | PRN
  Administered 2012-03-07: 0.5 mg via INTRAVENOUS
  Administered 2012-03-07 (×2): 0.25 mg via INTRAVENOUS

## 2012-03-07 MED ORDER — SENNA 8.6 MG PO TABS
1.0000 | ORAL_TABLET | Freq: Two times a day (BID) | ORAL | Status: DC
  Administered 2012-03-07 – 2012-03-09 (×4): 8.6 mg via ORAL
  Filled 2012-03-07 (×6): qty 1

## 2012-03-07 MED ORDER — PROPOFOL 10 MG/ML IV BOLUS
INTRAVENOUS | Status: DC | PRN
  Administered 2012-03-07: 200 mg via INTRAVENOUS

## 2012-03-07 MED ORDER — KCL IN DEXTROSE-NACL 20-5-0.45 MEQ/L-%-% IV SOLN
INTRAVENOUS | Status: DC
  Administered 2012-03-07 – 2012-03-08 (×2): via INTRAVENOUS
  Filled 2012-03-07 (×6): qty 1000

## 2012-03-07 MED ORDER — CEFAZOLIN SODIUM 1-5 GM-% IV SOLN
1.0000 g | Freq: Three times a day (TID) | INTRAVENOUS | Status: AC
  Administered 2012-03-07 (×2): 1 g via INTRAVENOUS
  Filled 2012-03-07 (×2): qty 50

## 2012-03-07 MED ORDER — DIPHENHYDRAMINE HCL 12.5 MG/5ML PO ELIX: 12.5000 mg | ORAL_SOLUTION | Freq: Four times a day (QID) | ORAL | Status: DC | PRN

## 2012-03-07 MED ORDER — CARVEDILOL 25 MG PO TABS
25.0000 mg | ORAL_TABLET | Freq: Two times a day (BID) | ORAL | Status: DC
  Administered 2012-03-07 – 2012-03-09 (×4): 25 mg via ORAL
  Filled 2012-03-07 (×6): qty 1

## 2012-03-07 MED ORDER — ALUM & MAG HYDROXIDE-SIMETH 200-200-20 MG/5ML PO SUSP: 30.0000 mL | Freq: Four times a day (QID) | ORAL | Status: DC | PRN

## 2012-03-07 MED ORDER — DIPHENHYDRAMINE HCL 50 MG/ML IJ SOLN: 12.5000 mg | Freq: Four times a day (QID) | INTRAMUSCULAR | Status: DC | PRN

## 2012-03-07 MED ORDER — BISACODYL 10 MG RE SUPP
10.0000 mg | Freq: Every day | RECTAL | Status: DC | PRN
Start: 2012-03-07 — End: 2012-03-09

## 2012-03-07 MED ORDER — SODIUM CHLORIDE 0.9 % IJ SOLN: 9.0000 mL | INTRAMUSCULAR | Status: DC | PRN

## 2012-03-07 MED ORDER — EPHEDRINE SULFATE 50 MG/ML IJ SOLN
INTRAMUSCULAR | Status: DC | PRN
  Administered 2012-03-07 (×3): 10 mg via INTRAVENOUS

## 2012-03-07 MED ORDER — MIDAZOLAM HCL 5 MG/5ML IJ SOLN
INTRAMUSCULAR | Status: DC | PRN
  Administered 2012-03-07: 2 mg via INTRAVENOUS

## 2012-03-07 MED ORDER — NALOXONE HCL 0.4 MG/ML IJ SOLN: 0.4000 mg | INTRAMUSCULAR | Status: DC | PRN

## 2012-03-07 MED ORDER — LIDOCAINE HCL (CARDIAC) 20 MG/ML IV SOLN
INTRAVENOUS | Status: DC | PRN
  Administered 2012-03-07: 100 mg via INTRAVENOUS

## 2012-03-07 MED ORDER — DEXAMETHASONE SODIUM PHOSPHATE 4 MG/ML IJ SOLN
INTRAMUSCULAR | Status: DC | PRN
  Administered 2012-03-07: 8 mg via INTRAVENOUS

## 2012-03-07 MED ORDER — HYDROCODONE-ACETAMINOPHEN 5-325 MG PO TABS
1.0000 | ORAL_TABLET | ORAL | Status: DC | PRN
  Administered 2012-03-08: 1 via ORAL
  Filled 2012-03-07: qty 1

## 2012-03-07 MED ORDER — SODIUM CHLORIDE 0.9 % IV SOLN
INTRAVENOUS | Status: AC
  Filled 2012-03-07: qty 500

## 2012-03-07 MED ORDER — KCL IN DEXTROSE-NACL 20-5-0.45 MEQ/L-%-% IV SOLN
INTRAVENOUS | Status: AC
  Filled 2012-03-07: qty 1000

## 2012-03-07 MED ORDER — SODIUM CHLORIDE 0.9 % IR SOLN
Status: DC | PRN
  Administered 2012-03-07: 10:00:00

## 2012-03-07 MED ORDER — FLEET ENEMA 7-19 GM/118ML RE ENEM: 1.0000 | ENEMA | Freq: Once | RECTAL | Status: AC | PRN

## 2012-03-07 MED ORDER — MENTHOL 3 MG MT LOZG
1.0000 | LOZENGE | OROMUCOSAL | Status: DC | PRN
  Filled 2012-03-07: qty 9

## 2012-03-07 MED ORDER — VITAMIN D3 25 MCG (1000 UNIT) PO TABS
1000.0000 [IU] | ORAL_TABLET | Freq: Every day | ORAL | Status: DC
  Administered 2012-03-08 – 2012-03-09 (×2): 1000 [IU] via ORAL
  Filled 2012-03-07 (×2): qty 1

## 2012-03-07 MED ORDER — IRBESARTAN 300 MG PO TABS
300.0000 mg | ORAL_TABLET | Freq: Every day | ORAL | Status: DC
  Administered 2012-03-07 – 2012-03-09 (×3): 300 mg via ORAL
  Filled 2012-03-07 (×3): qty 1

## 2012-03-07 MED ORDER — ONDANSETRON HCL 4 MG/2ML IJ SOLN: 4.0000 mg | INTRAMUSCULAR | Status: DC | PRN

## 2012-03-07 MED ORDER — ROCURONIUM BROMIDE 100 MG/10ML IV SOLN
INTRAVENOUS | Status: DC | PRN
  Administered 2012-03-07: 50 mg via INTRAVENOUS

## 2012-03-07 SURGICAL SUPPLY — 72 items
BAG DECANTER FOR FLEXI CONT (MISCELLANEOUS) ×2 IMPLANT
BENZOIN TINCTURE PRP APPL 2/3 (GAUZE/BANDAGES/DRESSINGS) ×2 IMPLANT
BLADE SURG ROTATE 9660 (MISCELLANEOUS) IMPLANT
BONE VOID FILLER STRIP 10CC (Bone Implant) ×2 IMPLANT
BUR MATCHSTICK NEURO 3.0 LAGG (BURR) ×2 IMPLANT
BUR PRECISION FLUTE 5.0 (BURR) ×2 IMPLANT
CAGE 11MM (Cage) ×4 IMPLANT
CANISTER SUCTION 2500CC (MISCELLANEOUS) ×2 IMPLANT
CLOTH BEACON ORANGE TIMEOUT ST (SAFETY) ×2 IMPLANT
CONT SPEC 4OZ CLIKSEAL STRL BL (MISCELLANEOUS) ×4 IMPLANT
COVER BACK TABLE 24X17X13 BIG (DRAPES) IMPLANT
COVER TABLE BACK 60X90 (DRAPES) ×2 IMPLANT
DERMABOND ADVANCED (GAUZE/BANDAGES/DRESSINGS) ×1
DERMABOND ADVANCED .7 DNX12 (GAUZE/BANDAGES/DRESSINGS) ×1 IMPLANT
DRAPE C-ARM 42X72 X-RAY (DRAPES) ×4 IMPLANT
DRAPE LAPAROTOMY 100X72X124 (DRAPES) ×2 IMPLANT
DRAPE POUCH INSTRU U-SHP 10X18 (DRAPES) ×2 IMPLANT
DRAPE SURG 17X23 STRL (DRAPES) ×2 IMPLANT
DRESSING TELFA 8X3 (GAUZE/BANDAGES/DRESSINGS) ×2 IMPLANT
DURAPREP 26ML APPLICATOR (WOUND CARE) ×2 IMPLANT
ELECT REM PT RETURN 9FT ADLT (ELECTROSURGICAL) ×2
ELECTRODE REM PT RTRN 9FT ADLT (ELECTROSURGICAL) ×1 IMPLANT
GAUZE SPONGE 4X4 16PLY XRAY LF (GAUZE/BANDAGES/DRESSINGS) IMPLANT
GLOVE BIO SURGEON STRL SZ8 (GLOVE) ×8 IMPLANT
GLOVE BIOGEL PI IND STRL 8 (GLOVE) ×2 IMPLANT
GLOVE BIOGEL PI IND STRL 8.5 (GLOVE) ×3 IMPLANT
GLOVE BIOGEL PI INDICATOR 8 (GLOVE) ×2
GLOVE BIOGEL PI INDICATOR 8.5 (GLOVE) ×3
GLOVE ECLIPSE 7.5 STRL STRAW (GLOVE) ×2 IMPLANT
GLOVE ECLIPSE 8.0 STRL XLNG CF (GLOVE) ×4 IMPLANT
GLOVE EXAM NITRILE LRG STRL (GLOVE) IMPLANT
GLOVE EXAM NITRILE MD LF STRL (GLOVE) IMPLANT
GLOVE EXAM NITRILE XL STR (GLOVE) IMPLANT
GLOVE EXAM NITRILE XS STR PU (GLOVE) IMPLANT
GLOVE INDICATOR 8.5 STRL (GLOVE) ×4 IMPLANT
GOWN BRE IMP SLV AUR LG STRL (GOWN DISPOSABLE) IMPLANT
GOWN BRE IMP SLV AUR XL STRL (GOWN DISPOSABLE) ×4 IMPLANT
GOWN STRL REIN 2XL LVL4 (GOWN DISPOSABLE) ×6 IMPLANT
KIT BASIN OR (CUSTOM PROCEDURE TRAY) ×2 IMPLANT
KIT INFUSE SMALL (Orthopedic Implant) ×2 IMPLANT
KIT POSITION SURG JACKSON T1 (MISCELLANEOUS) ×2 IMPLANT
KIT ROOM TURNOVER OR (KITS) ×2 IMPLANT
MILL MEDIUM DISP (BLADE) ×4 IMPLANT
NEEDLE HYPO 25X1 1.5 SAFETY (NEEDLE) ×2 IMPLANT
NEEDLE SPNL 18GX3.5 QUINCKE PK (NEEDLE) IMPLANT
NS IRRIG 1000ML POUR BTL (IV SOLUTION) ×2 IMPLANT
PACK LAMINECTOMY NEURO (CUSTOM PROCEDURE TRAY) ×2 IMPLANT
PAD ARMBOARD 7.5X6 YLW CONV (MISCELLANEOUS) ×6 IMPLANT
PATTIES SURGICAL .5 X.5 (GAUZE/BANDAGES/DRESSINGS) IMPLANT
PATTIES SURGICAL .5 X1 (DISPOSABLE) IMPLANT
PATTIES SURGICAL 1X1 (DISPOSABLE) IMPLANT
ROD 35MM (Rod) ×4 IMPLANT
SCREW POLYAX 6.5X45MM (Screw) ×8 IMPLANT
SCREW SET SPINAL STD HEXALOBE (Screw) ×8 IMPLANT
SPONGE GAUZE 4X4 12PLY (GAUZE/BANDAGES/DRESSINGS) ×2 IMPLANT
SPONGE LAP 4X18 X RAY DECT (DISPOSABLE) IMPLANT
SPONGE SURGIFOAM ABS GEL 100 (HEMOSTASIS) ×2 IMPLANT
STAPLER SKIN PROX WIDE 3.9 (STAPLE) IMPLANT
STRIP CLOSURE SKIN 1/2X4 (GAUZE/BANDAGES/DRESSINGS) ×2 IMPLANT
SUT VIC AB 1 CT1 18XBRD ANBCTR (SUTURE) ×2 IMPLANT
SUT VIC AB 1 CT1 8-18 (SUTURE) ×2
SUT VIC AB 2-0 CT1 18 (SUTURE) ×4 IMPLANT
SUT VIC AB 3-0 SH 8-18 (SUTURE) ×4 IMPLANT
SYR 20ML ECCENTRIC (SYRINGE) ×2 IMPLANT
SYR 3ML LL SCALE MARK (SYRINGE) ×4 IMPLANT
SYR 5ML LL (SYRINGE) IMPLANT
TAPE CLOTH SURG 4X10 WHT LF (GAUZE/BANDAGES/DRESSINGS) ×2 IMPLANT
TOWEL OR 17X24 6PK STRL BLUE (TOWEL DISPOSABLE) ×2 IMPLANT
TOWEL OR 17X26 10 PK STRL BLUE (TOWEL DISPOSABLE) ×2 IMPLANT
TRAP SPECIMEN MUCOUS 40CC (MISCELLANEOUS) ×2 IMPLANT
TRAY FOLEY CATH 14FRSI W/METER (CATHETERS) ×2 IMPLANT
WATER STERILE IRR 1000ML POUR (IV SOLUTION) ×2 IMPLANT

## 2012-03-07 NOTE — Brief Op Note (Signed)
03/07/2012  12:45 PM  PATIENT:  [name] [name]  66 y.o. male  PRE-OPERATIVE DIAGNOSIS:  Lumbar stenosis, Lumbar spondylolisthesis Lumbar spondylosis, Lumbago, Lumbar radiculopathy L4/5  POST-OPERATIVE DIAGNOSIS:   Lumbar stenosis, Lumbar spondylolisthesis Lumbar spondylosis, Lumbago, Lumbar radiculopathy L4/5  PROCEDURE:  Procedure(s) (LRB) with comments: POSTERIOR LUMBAR FUSION 1 LEVEL (N/A) - Posterior Lumar Four-Five Decompression, Interbody, and Fusion with Pedicle screw fixation and posterolateral arthodesis L4/5  SURGEON:  Surgeon(s) and Role:    * Davon Abdelaziz, MD - Primary    * Gary P Cram, MD - Assisting  PHYSICIAN ASSISTANT:   ASSISTANTS: Poteat, RN   ANESTHESIA:   general  EBL:  Total I/O In: 2200 [I.V.:2200] Out: 475 [Urine:305; Blood:170]  BLOOD ADMINISTERED:none  DRAINS: none   LOCAL MEDICATIONS USED:  LIDOCAINE   SPECIMEN:  No Specimen  DISPOSITION OF SPECIMEN:  N/A  COUNTS:  YES  TOURNIQUET:  * No tourniquets in log *  DICTATION: DICTATION: Patient is 66-year-old man with mobile spondylolisthesis of L4 on L5 with lumbar stenosis. He has a severe right L4 radiculopathy. It was elected to take him to surgery for decompression and fusion at this level.   Procedure: Patient was placed in a prone position on the Jackson table after smooth and uncomplicated induction of general endotracheal anesthesia. His low back was prepped and draped in usual sterile fashion with DuraPrep. Area of incision was infiltrated with local lidocaine. Incision was made to the lumbodorsal fascia was incised and exposure was performed of the L4 through L5 spinous processes laminae facet joint and transverse processes. Intraoperative x-ray was obtained which confirmed correct orientation. A total laminectomy of L4 was performed with disarticulation of the facet joints at this level and thorough decompression was performed of both L4 and L5 nerve roots along with the common dural tube.  This decompression was more involved than would be typical of that performed for PLIF alone and included painstaking dissection of adherent ligament compressing the thecal sac and wide decompression of all neural elements. A thorough discectomy was initially performed on the left with preparation of the endplates for grafting a trial spacer was placed this level and a thorough discectomy was performed on the right as well. Bone autograft was packed within the interspace bilaterally along with small BMP kit and NexOss bone graft extender. Bilateral median 11 mm peek cages were packed with BMP and extender and was inserted the interspace and countersunk appropriately along with 6 cc of morselized bone autograft. The posterolateral region was extensively decorticated and pedicle probes were placed at L4 and L5 bilaterally. Intraoperative fluoroscopy confirmed correct orientation in the AP and lateral plane. 45 x 6.5 mm pedicle screws were placed at L5 bilaterally and 45 x 6.5 mm screws placed at L4 bilaterally final x-rays demonstrated well-positioned interbody grafts and pedicle screw fixation. A 35 mm lordotic rod was placed on the right and a 35 mm rod was placed on the left locked down in situ and the posterolateral region was packed with the remaining bone graft extender bilaterally. The wound was irrigated and fascia was closed with 1 Vicryl sutures skin edges were reapproximated 2 and 3-0 Vicryl sutures. The wound was dressed with benzoin Steri-Strips Telfa gauze and tape the patient was extubated in the operating room and taken to recovery in stable satisfactory condition. He tolerated the operation well counts were correct at the end of the case.   PLAN OF CARE: Admit to inpatient   PATIENT DISPOSITION:  PACU - hemodynamically stable.     Delay start of Pharmacological VTE agent (>24hrs) due to surgical blood loss or risk of bleeding: yes   

## 2012-03-07 NOTE — Op Note (Signed)
03/07/2012  12:45 PM  PATIENT:  [name] [name]  66 y.o. male  PRE-OPERATIVE DIAGNOSIS:  Lumbar stenosis, Lumbar spondylolisthesis Lumbar spondylosis, Lumbago, Lumbar radiculopathy L4/5  POST-OPERATIVE DIAGNOSIS:   Lumbar stenosis, Lumbar spondylolisthesis Lumbar spondylosis, Lumbago, Lumbar radiculopathy L4/5  PROCEDURE:  Procedure(s) (LRB) with comments: POSTERIOR LUMBAR FUSION 1 LEVEL (N/A) - Posterior Lumar Four-Five Decompression, Interbody, and Fusion with Pedicle screw fixation and posterolateral arthodesis L4/5  SURGEON:  Surgeon(s) and Role:    * Maeola Harman, MD - Primary    * Mariam Dollar, MD - Assisting  PHYSICIAN ASSISTANT:   ASSISTANTS: Poteat, RN   ANESTHESIA:   general  EBL:  Total I/O In: 2200 [I.V.:2200] Out: 475 [Urine:305; Blood:170]  BLOOD ADMINISTERED:none  DRAINS: none   LOCAL MEDICATIONS USED:  LIDOCAINE   SPECIMEN:  No Specimen  DISPOSITION OF SPECIMEN:  N/A  COUNTS:  YES  TOURNIQUET:  * No tourniquets in log *  DICTATION: DICTATION: Patient is 66 year old man with mobile spondylolisthesis of L4 on L5 with lumbar stenosis. He has a severe right L4 radiculopathy. It was elected to take him to surgery for decompression and fusion at this level.   Procedure: Patient was placed in a prone position on the [place] table after smooth and uncomplicated induction of general endotracheal anesthesia. His low back was prepped and draped in usual sterile fashion with DuraPrep. Area of incision was infiltrated with local lidocaine. Incision was made to the lumbodorsal fascia was incised and exposure was performed of the L4 through L5 spinous processes laminae facet joint and transverse processes. Intraoperative x-ray was obtained which confirmed correct orientation. A total laminectomy of L4 was performed with disarticulation of the facet joints at this level and thorough decompression was performed of both L4 and L5 nerve roots along with the common dural tube.  This decompression was more involved than would be typical of that performed for PLIF alone and included painstaking dissection of adherent ligament compressing the thecal sac and wide decompression of all neural elements. A thorough discectomy was initially performed on the left with preparation of the endplates for grafting a trial spacer was placed this level and a thorough discectomy was performed on the right as well. Bone autograft was packed within the interspace bilaterally along with small BMP kit and NexOss bone graft extender. Bilateral median 11 mm peek cages were packed with BMP and extender and was inserted the interspace and countersunk appropriately along with 6 cc of morselized bone autograft. The posterolateral region was extensively decorticated and pedicle probes were placed at L4 and L5 bilaterally. Intraoperative fluoroscopy confirmed correct orientation in the AP and lateral plane. 45 x 6.5 mm pedicle screws were placed at L5 bilaterally and 45 x 6.5 mm screws placed at L4 bilaterally final x-rays demonstrated well-positioned interbody grafts and pedicle screw fixation. A 35 mm lordotic rod was placed on the right and a 35 mm rod was placed on the left locked down in situ and the posterolateral region was packed with the remaining bone graft extender bilaterally. The wound was irrigated and fascia was closed with 1 Vicryl sutures skin edges were reapproximated 2 and 3-0 Vicryl sutures. The wound was dressed with benzoin Steri-Strips Telfa gauze and tape the patient was extubated in the operating room and taken to recovery in stable satisfactory condition. He tolerated the operation well counts were correct at the end of the case.   PLAN OF CARE: Admit to inpatient   PATIENT DISPOSITION:  PACU - hemodynamically stable.  Delay start of Pharmacological VTE agent (>24hrs) due to surgical blood loss or risk of bleeding: yes

## 2012-03-07 NOTE — Transfer of Care (Signed)
Immediate Anesthesia Transfer of Care Note  Patient: [name] [name]  Procedure(s) Performed: Procedure(s) (LRB) with comments: POSTERIOR LUMBAR FUSION 1 LEVEL (N/A) - Posterior Lumar Four-Five Decompression, Interbody, and Fusion  Patient Location: PACU  Anesthesia Type: General  Level of Consciousness: awake, alert  and oriented  Airway & Oxygen Therapy: Patient Spontanous Breathing and Patient connected to nasal cannula oxygen  Post-op Assessment: Report given to PACU RN and Post -op Vital signs reviewed and stable  Post vital signs: Reviewed and stable  Complications: No apparent anesthesia complications

## 2012-03-07 NOTE — Progress Notes (Signed)
UR completed 

## 2012-03-07 NOTE — Anesthesia Postprocedure Evaluation (Signed)
  Anesthesia Post-op Note  Patient: [name] [name]  Procedure(s) Performed: Procedure(s) (LRB) with comments: POSTERIOR LUMBAR FUSION 1 LEVEL (N/A) - Posterior Lumar Four-Five Decompression, Interbody, and Fusion  Patient Location: PACU  Anesthesia Type: General  Level of Consciousness: awake  Airway and Oxygen Therapy: Patient Spontanous Breathing  Post-op Pain: mild  Post-op Assessment: Post-op Vital signs reviewed  Post-op Vital Signs: Reviewed  Complications: No apparent anesthesia complications

## 2012-03-07 NOTE — Preoperative (Signed)
Beta Blockers   Reason not to administer Beta Blockers:Not Applicable 

## 2012-03-07 NOTE — H&P (Signed)
[name] [name]   #[id number] DOB:  02-07-1946 01/09/2012:    [name] [name] returns today and his nurse care manager, [name] [name] also came in at the conclusion of the visit to discuss his situation.  [name] [name] essentially feels that his neck is somewhat better.  He is having a lot less discomfort in his neck.  His back is not as bothersome but his right leg pain has not improved at all.  This is despite injection and physical therapy.    I gave him the option of continuing physical therapy and injections versus proceeding with surgery and after lengthy discussion since he feels he has not made any progress at all with his right leg pain, I think that proceeding with surgery makes sense.    We then went over the specifics of the surgical procedure and my recommendations and I had reviewed them with both [name] [name] and [name] [name] previously.  I reviewed them again today.  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  I explained to him that given the severity of the nerve root compression I am not surprised that he has not gotten better with conservative measures and I do not think that injections and further therapy are going to make much more progress as he has done two months of physical therapy and a total of three injections and yet he has not made any progress.  The decompression and fusion operation is necessary because the facet joint is causing significant nerve root compression and a diskectomy as I had earlier indicated would not be sufficient to treat that problem.  This would involve the L4-5 level.    I discussed the risks and benefits with the patient.  He wishes to proceed with surgery.  We will need to get authorization.  He will need to be fitted for an LSO brace  afterwards.    I am going to renew his physical therapy for his neck and we are going to hold on physical therapy for his low back.  This will be for 2 times per week for six weeks of physical therapy. If it in fact takes this long to get approval for surgery, then he should probably continue to work on his back but hopefully if they can expedite that approval then I will hold on physical therapy for his low back.          Danae Orleans. Venetia Maxon, M.D./gde    [name] [name]  [phone]  DOB:  1945/05/22  12/05/2011:  I met with the patient and then later met with his nurse case manager, [name] [name].  I reviewed his studies and reviewed his progress.  Apparently, there was some error in terms of ordering physical therapy.  I do not know if that was on our part or on the part of physical therapy, but [name] [name] was told that he was scheduled for 2 x a month physical therapy when in fact we had ordered 2 to 3 x per week.  Clearly, if we are going to see if he can get better with physical therapy, he needs to have an adequate trial of physical therapy and 2 x a month would not be sufficient.  I have recommended we do 3 x weekly for 6  weeks to see if we can get him any improvement in his leg and back pain.  In terms of the injection, he got about 2 days of relief with a transforaminal injection.  I would like to see if he could get more sustained relief with an additional injection before we decide that is not effective.    I reviewed his MRI with him and his nurse case manager and it shows that he has significant facet arthropathy and combination of bulging disc and marked facet arthropathy causing significant right L4 nerve root compression in the foramen.  I explained that because of the facet arthropathy, as well as the significant foraminal nerve root compression that if we were to talk about surgery, this would require decompression and fusion at the L4-5 level.  I would like to see that he gets better without surgery,  but if he does need surgery that would be what would be necessary.  A less involved surgery such as a microdiskectomy would not be sufficient and I think given the severe facet arthropathy and then to take the joint apart surgically, he would require a fusion as part of that procedure.    He is doing well from the standpoint of his neck and his hand strength is full.  I do not see any signs of increasing myelopathy.    I will see how he does with a repeat injection and physical therapy at 3 x weekly for the next 6 weeks and he will come back to see me.  He is taking Mobic 15 mg every now and then, but not on a very regular basis.  I have encouraged him to take the Mobic daily to see if this will help as opposed to just on an as needed basis.  He will come back to see me in a month and we will see what kind of progress he is making.  I had released him to light duty work, but apparently there is no light duty work and he is out of work.  He remains on light duty work from my standpoint.          Danae Orleans. Venetia Maxon, M.D./sv [facility]  [name] [name]  #[id number]  DOB:  1946-01-09  November 07, 2011  HISTORY:     [name] [name] is a 66 year old man who works at [employer] who was injured on 07/23/2011 when he fell on a soapy floor while carrying a ramp.  He has pain and says it moves depending on his activities.  He has low back pain which he grades as a 5/10 and right leg pain which he grades at 7/10 and neck he grades as 5/10.  He underwent physical therapy and lumbar injections without relief.  He has another scheduled in the coming week and this is being done in [place], [place].  He has been taking Mobic 7.5 mg. without any relief.    He currently complains of neck pain.  At the time of the injury, he had subluxation of the right elbow and says this is no longer a big issue for him.  He complains of right-sided pain into his shoulder and occasionally the left.  He denies numbness or arm pain.  He  describes his low back pain that goes into his right buttock and the sole of his right foot.    I reviewed outside records including Workman's Comp visit.  I also reviewed a cervical MRI which was performed at [facility]  on 09/07/2011 which shows 4 mm degenerative posterior subluxation C5 on C6 with faintly increased T2 signal on the cord and with severe central stenosis at C5-6 and severe impingement C3-4, C4-5, C5-6 and C6-7 levels.  Lumbar MRI was also reviewed which shows bilateral moderate foraminal stenosis at L4-5 right greater than left with moderate facet hypertrophy and mild degenerative anterolisthesis of L4 on L5.    I reviewed outside records from [facility].  I also agree with the interpretation that the cervical degenerative changes are quite extensive although the patient's neurologic examination is fairly unremarkable.    REVIEW OF SYSTEMS:   A detailed Review of Systems sheet was reviewed with the patient. Otherwise negative.    PAST MEDICAL HISTORY:      Current Medical Conditions:    As previously described.       Prior Operations and Hospitalizations:   Significant for right knee arthroscopy years ago.      Medications and Allergies:   No known drug allergies.      Height and Weight:     6' tall and 216 pounds.         [name] [name]  #[id number]  DOB:  1946/03/22   November 07, 2011 Page 2  SOCIAL HISTORY:    He denies tobacco or drug use and is a rare drinker of alcoholic beverages.      DIAGNOSTIC STUDIES:   As previously described.      PHYSICAL EXAMINATION:      General Appearance:   On examination today, [name] [name] is a pleasant and cooperative man in no acute distress.        Blood Pressure, Pulse, Respirations:   146/86, heart rate 74 and regular, respirations 16.      HEENT - normocephalic, atraumatic.  The pupils are equal, round and reactive to light.  The extraocular muscles are intact.  Sclerae - white.   Conjunctiva - pink.  Oropharynx benign.  Uvula midline.     Neck -  with axial compression, he complains of tingling in to his head but not into his body.  He has a negative Spurlings' maneuver to either side.      Respiratory - there is normal respiratory effort with good intercostal function.  Lungs are clear to auscultation.  There are no rales, rhonchi or wheezes.      Cardiovascular - the heart has regular rate and rhythm to auscultation.  No murmurs are appreciated.  There is no extremity edema, cyanosis or clubbing.  There are palpable pedal pulses.      Abdomen - soft, nontender, no hepatosplenomegaly appreciated or masses.  There are active bowel sounds.  No guarding or rebound.      Musculoskeletal Examination - he has pain at the lumbosacral junction.  He is able to bend to within 4 inches of the floor with his upper extremities outstretched.  He is able to stand on his heels and toes. He has positive straight leg raise for low back pain.    NEUROLOGICAL EXAMINATION:  The patient is oriented to time, person and place and has good recall of both recent and remote memory with normal attention span and concentration.  The patient speaks with clear and fluent speech and exhibits normal language function and appropriate fund of knowledge.      Cranial Nerve Examination - pupils are equal, round and reactive to light.  Extraocular movements are full.  Visual fields are full to confrontational testing.  Facial sensation  and facial movement are symmetric and intact.  Hearing is intact to finger rub.  Palate is upgoing.  Shoulder shrug is symmetric.  Tongue protrudes in the midline.      Motor Examination - motor strength is 5/5 in the bilateral deltoids, biceps, triceps, handgrips, wrist extensors, interosseous.  In the lower extremities motor strength is 5/5 in hip flexion, extension, quadriceps, hamstrings, plantar flexion, dorsiflexion and extensor hallucis longus.     Sensory Examination - he  has decreased pin sensation in the right L5 and S1 distributions.      Deep Tendon Reflexes - 2 in the biceps, triceps, and brachioradialis, 2 in the knees, 2 in the ankles.  The great toes are downgoing to plantar stimulation.  Negative Hoffmann's sign on the right, positive on the left.      Cerebellar Examination - normal coordination in upper and lower extremities and normal rapid alternating movements.  Romberg test is negative.    IMPRESSION AND RECOMMENDATIONS: [name] [name] is a 66 year old man with a neck and back injury.  He has lumbar spondylosis and stenosis.  He also has cervical spondylosis and stenosis and cord compression.  I explained to him that his symptoms are fairly mild despite these rather significant imaging findings, and I indicated to him that I thought from the standpoint of his low back he could potentially benefit from a right L4 selective nerve root block to see if this would give him some relief from his discomfort.   It will be okay to go through physical therapy 3 times weekly for six weeks for low back and neck pain with the diagnosis of cervical stenosis.  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  I explained that I have reservations about him returning to full duty without restrictions given his cervical stenosis but I was also reluctant to recommend putting him through a major surgery on his neck unless he continues to have symptoms.  I also explained that he will likely need to be followed closely to make sure he does not develop significant cervical myelopathy.  I discussed this situation and met with [name] Rochester, RN, Field Case Manager to review the situation and my recommendations.  I plan on seeing him back in a month to see how he does with the injection, limited duty work and physical therapy.  He knows to contact me if he is having worsening problems with neck pain, numbness or signs of cervical  myelopathy.    [facility]    Danae Orleans. Venetia Maxon, M.D.

## 2012-03-07 NOTE — Progress Notes (Signed)
Patient refused PCA.  Chose to take only pills.

## 2012-03-07 NOTE — Interval H&P Note (Signed)
History and Physical Interval Note:  03/07/2012 2:37 AM  [name] [name]  has presented today for surgery, with the diagnosis of Lumbar stenosis, Lumbar degenerative disc disease, Lumbago, Lumbar radiculopathy  The various methods of treatment have been discussed with the patient and family. After consideration of risks, benefits and other options for treatment, the patient has consented to  Procedure(s) (LRB) with comments: POSTERIOR LUMBAR FUSION 1 LEVEL (N/A) - L4-5 Decompression/Fusion as a surgical intervention .  The patient's history has been reviewed, patient examined, no change in status, stable for surgery.  I have reviewed the patient's chart and labs.  Questions were answered to the patient's satisfaction.     [name] [name] D  Date of Initial H&P: 03/06/2012  History reviewed, patient examined, no change in status, stable for surgery.

## 2012-03-07 NOTE — Progress Notes (Signed)
Awake, alert, conversant.  Full strength both lower extremities.  Complaining of appropriate amount of incisional back pain.

## 2012-03-07 NOTE — Anesthesia Preprocedure Evaluation (Addendum)
Anesthesia Evaluation  Patient identified by MRN, date of birth, ID band Patient awake    Reviewed: Allergy & Precautions, H&P , NPO status , Patient's Chart, lab work & pertinent test results  History of Anesthesia Complications (+) PONV  Airway Mallampati: II TM Distance: >3 FB Neck ROM: Full    Dental  (+) Dental Advisory Given, Teeth Intact and Partial Upper,    Pulmonary former smoker,          Cardiovascular hypertension, Pt. on medications and Pt. on home beta blockers     Neuro/Psych  Headaches, PSYCHIATRIC DISORDERS Anxiety    GI/Hepatic negative GI ROS, Neg liver ROS,   Endo/Other  negative endocrine ROS  Renal/GU negative Renal ROS     Musculoskeletal   Abdominal   Peds  Hematology negative hematology ROS (+)   Anesthesia Other Findings   Reproductive/Obstetrics                         Anesthesia Physical Anesthesia Plan  ASA: III  Anesthesia Plan: General   Post-op Pain Management:    Induction: Intravenous  Airway Management Planned: Oral ETT  Additional Equipment:   Intra-op Plan:   Post-operative Plan: Extubation in OR  Informed Consent: I have reviewed the patients History and Physical, chart, labs and discussed the procedure including the risks, benefits and alternatives for the proposed anesthesia with the patient or authorized representative who has indicated his/her understanding and acceptance.     Plan Discussed with: CRNA, Anesthesiologist and Surgeon  Anesthesia Plan Comments:         Anesthesia Quick Evaluation

## 2012-03-08 MED ORDER — CEFAZOLIN SODIUM 1-5 GM-% IV SOLN
1.0000 g | Freq: Three times a day (TID) | INTRAVENOUS | Status: DC
  Administered 2012-03-08 – 2012-03-09 (×3): 1 g via INTRAVENOUS
  Filled 2012-03-08 (×6): qty 50

## 2012-03-08 NOTE — Evaluation (Signed)
Physical Therapy Evaluation Patient Details Name: [name] [name] MRN: [id number] DOB: Feb 18, 1946 Today's Date: 03/08/2012 Time: 4098-1191 PT Time Calculation (min): 16 min  PT Assessment / Plan / Recommendation Clinical Impression  Pt s/p PLF L4-5. Pt with great functional mobility, supervision level only with cueing to maintain back precautions. No further acute PT needs, will not follow.     PT Assessment  Patent does not need any further PT services    Follow Up Recommendations  No PT follow up;Supervision - Intermittent    Does the patient have the potential to tolerate intense rehabilitation      Barriers to Discharge        Equipment Recommendations  None recommended by PT    Recommendations for Other Services     Frequency      Precautions / Restrictions Precautions Precautions: Back Precaution Booklet Issued: Yes (comment) Precaution Comments: pt educated on 3/3 back precautions Required Braces or Orthoses: Spinal Brace Spinal Brace: Lumbar corset;Applied in sitting position Restrictions Weight Bearing Restrictions: No   Pertinent Vitals/Pain Pain 7/10      Mobility  Bed Mobility Bed Mobility: Rolling Right;Right Sidelying to Sit;Sitting - Scoot to Edge of Bed Rolling Right: 5: Supervision Right Sidelying to Sit: 5: Supervision Sitting - Scoot to Edge of Bed: 5: Supervision Details for Bed Mobility Assistance: VC for proper sequencing to maintain back precautions during transfer. No physical assist needed Transfers Transfers: Sit to Stand;Stand to Sit Sit to Stand: 5: Supervision;With upper extremity assist;From bed Stand to Sit: 5: Supervision;With upper extremity assist;To bed Details for Transfer Assistance: VC for hand placement as well as safe technique to maintain straight back during sit/stand Ambulation/Gait Ambulation/Gait Assistance: 5: Supervision Ambulation Distance (Feet): 400 Feet Assistive device: Rolling walker;None Ambulation/Gait  Assistance Details: First half of ambulation with RW, pt supervision only without RW with better posture. No gait deficits Gait Pattern: Within Functional Limits Gait velocity: normal gait speed Stairs: Yes Stairs Assistance: 5: Supervision Stairs Assistance Details (indicate cue type and reason): VC for proper sequencing to maintain back precautions Stair Management Technique: One rail Right;Step to pattern;Forwards Number of Stairs: 13  Wheelchair Mobility Wheelchair Mobility: No    Shoulder Instructions     Exercises     PT Diagnosis:    PT Problem List:   PT Treatment Interventions:     PT Goals    Visit Information  Last PT Received On: 03/08/12 Assistance Needed: +1    Subjective Data  Patient Stated Goal: to move around without hurting   Prior Functioning  Home Living Lives With: Significant other Available Help at Discharge: Family;Other (Comment) (works during the day) Type of Home: House Home Access: Stairs to enter Secretary/administrator of Steps: 9 Entrance Stairs-Rails: Right;Left Home Layout: Two level Alternate Level Stairs-Number of Steps: 13 Alternate Level Stairs-Rails: Right Bathroom Shower/Tub: Tub/shower unit;Curtain Bathroom Toilet: Standard Bathroom Accessibility: Yes How Accessible: Accessible via walker Home Adaptive Equipment: Crutches;Straight cane Prior Function Level of Independence: Independent Able to Take Stairs?: Yes Driving: Yes Vocation: Workers Chief Operating Officer: No difficulties Dominant Hand: Right    Cognition  Overall Cognitive Status: Appears within functional limits for tasks assessed/performed Arousal/Alertness: Awake/alert Orientation Level: Appears intact for tasks assessed Behavior During Session: [facility] for tasks performed    Extremity/Trunk Assessment Right Lower Extremity Assessment RLE ROM/Strength/Tone: Within functional levels RLE Sensation: WFL - Light Touch RLE Coordination: WFL -  gross/fine motor Left Lower Extremity Assessment LLE ROM/Strength/Tone: Within functional levels LLE Sensation: WFL - Light  Touch LLE Coordination: WFL - gross/fine motor   Balance    End of Session PT - End of Session Equipment Utilized During Treatment: Back brace Activity Tolerance: Patient tolerated treatment well Patient left: in bed;with call bell/phone within reach;Other (comment) (with OT) Nurse Communication: Mobility status  GP     Milana Kidney 03/08/2012, 10:27 AM  03/08/2012 Milana Kidney DPT PAGER: [phone] OFFICE: [phone]

## 2012-03-08 NOTE — Progress Notes (Signed)
Patient ID: [name] [name], male   DOB: 08-25-1945, 66 y.o.   MRN: [id number] Subjective: Patient reports he is doing quite well. Right leg pain improved. Appropriate back soreness  Objective: Vital signs in last 24 hours: Temp:  [96.2 F (35.7 C)-98.2 F (36.8 C)] 98.2 F (36.8 C) (10/19 5284) Pulse Rate:  [60-71] 62  (10/19 0643) Resp:  [18-20] 20  (10/19 0643) BP: (93-150)/(46-79) 135/68 mmHg (10/19 0643) SpO2:  [96 %-99 %] 97 % (10/19 0643) Weight:  [97.977 kg (216 lb)] 97.977 kg (216 lb) (10/18 2128)  Intake/Output from previous day: 10/18 0701 - 10/19 0700 In: 2848.8 [P.O.:300; I.V.:2498.8; IV Piggyback:50] Out: 2725 [Urine:2555; Blood:170] Intake/Output this shift:    Neurologic: Grossly normal  Lab Results: Lab Results  Component Value Date   WBC 8.4 02/27/2012   HGB 15.5 02/27/2012   HCT 43.7 02/27/2012   MCV 84.5 02/27/2012   PLT 185 02/27/2012   No results found for this basename: INR, PROTIME   BMET Lab Results  Component Value Date   NA 139 02/27/2012   K 3.9 02/27/2012   CL 103 02/27/2012   CO2 25 02/27/2012   GLUCOSE 125* 02/27/2012   BUN 11 02/27/2012   CREATININE 0.74 02/27/2012   CALCIUM 9.9 02/27/2012    Studies/Results: Dg Lumbar Spine 2-3 Views  03/07/2012  *RADIOLOGY REPORT*  Clinical Data: L4-L5 posterior lumbar fusion  LUMBAR SPINE - 2-3 VIEW,DG C-ARM 1-60 MIN  Comparison: MRI 08/27/2011, lumbar spine radiograph 11/07/2011  Findings: Numbering is as per prior examination at [facility] 08/27/2011.  Four intraoperative images are provided, demonstrating posterior lumbar interbody fusion and intervertebral disc spacer at the L4-L5 level.  No evidence for hardware failure or acute abnormality.  IMPRESSION: Expected intraoperative appearance after L4-L5 posterior lumbar interbody fusion.   Original Report Authenticated By: Harrel Lemon, M.D.    Dg Lumbar Spine 1 View  03/07/2012  *RADIOLOGY REPORT*  Clinical Data: Posterior fusion L4-5.  LUMBAR  SPINE - 1 VIEW  Comparison: 11/07/2011  Findings: Single lateral intraoperative image demonstrates posterior surgical instruments direct at the L4 and L5 vertebral bodies.  IMPRESSION: Intraoperative localization as above.   Original Report Authenticated By: Cyndie Chime, M.D.    Dg C-arm 1-60 Min  03/07/2012  *RADIOLOGY REPORT*  Clinical Data: L4-L5 posterior lumbar fusion  LUMBAR SPINE - 2-3 VIEW,DG C-ARM 1-60 MIN  Comparison: MRI 08/27/2011, lumbar spine radiograph 11/07/2011  Findings: Numbering is as per prior examination at [facility] 08/27/2011.  Four intraoperative images are provided, demonstrating posterior lumbar interbody fusion and intervertebral disc spacer at the L4-L5 level.  No evidence for hardware failure or acute abnormality.  IMPRESSION: Expected intraoperative appearance after L4-L5 posterior lumbar interbody fusion.   Original Report Authenticated By: Harrel Lemon, M.D.     Assessment/Plan: Doing well. Likely home tomorrow   LOS: 1 day    Rene Sizelove S 03/08/2012, 9:38 AM

## 2012-03-08 NOTE — Evaluation (Signed)
Occupational Therapy Evaluation Patient Details Name: [name] [name] MRN: [id number] DOB: 10/29/1945 Today's Date: 03/08/2012 Time: 4098-1191 OT Time Calculation (min): 40 min  OT Assessment / Plan / Recommendation Clinical Impression  Pt s/p PLIF L4-5. All education completed, pt is moving well and has adequate support upon d/c. Pt states he would like to acquire ADL kit (long handled shoe horn, long handled sponge, reacher, sock aid and toileting aid)- encouraged him to speak with whomever is overseeing his worker's comp case. MD may need to write prescription for this- spoke with Dr. Wynetta Emery who is aware of this.    OT Assessment  Patient does not need any further OT services    Follow Up Recommendations  No OT follow up;Supervision - Intermittent    Barriers to Discharge      Equipment Recommendations  None recommended by PT;None recommended by OT    Recommendations for Other Services    Frequency       Precautions / Restrictions Precautions Precautions: Back Precaution Booklet Issued: Yes (comment) Precaution Comments: pt educated on 3/3 back precautions Required Braces or Orthoses: Spinal Brace Spinal Brace: Lumbar corset;Applied in sitting position Restrictions Weight Bearing Restrictions: No   Pertinent Vitals/Pain Pt reports 7/10 back pain, states he is pre-medicated and agreeable to OOB with therapy.    ADL  Eating/Feeding: Performed;Independent Where Assessed - Eating/Feeding: Edge of bed Grooming: Performed;Wash/dry face;Wash/dry hands;Supervision/safety Where Assessed - Grooming: Unsupported standing Upper Body Bathing: Simulated;Set up;Min guard Where Assessed - Upper Body Bathing: Unsupported standing Lower Body Bathing: Simulated;Minimal assistance Where Assessed - Lower Body Bathing: Unsupported standing Lower Body Dressing: Simulated;Moderate assistance Where Assessed - Lower Body Dressing: Unsupported sit to stand Toilet Transfer:  Simulated;Supervision/safety Toilet Transfer Method: Sit to stand Toileting - Clothing Manipulation and Hygiene: Simulated;Minimal assistance Where Assessed - Engineer, mining and Hygiene: Sit to stand from 3-in-1 or toilet Tub/Shower Transfer: Simulated;Supervision/safety Tub/Shower Transfer Method: Ambulating Equipment Used: Back brace;Gait belt;Reacher;Sock aid;Long-handled shoe horn;Long-handled sponge Transfers/Ambulation Related to ADLs: Pt initially ambulated with RW, but was transitioned to no AD and able to perform ambulation with supervision ADL Comments: All AE education performed- pt declined any practice. Pt educated on impact of back precautions on BADLs and IADLs. Pt is to have adequate support upon d/c. No further acute OT needs indicated at this time. will sign off.    OT Diagnosis:    OT Problem List:   OT Treatment Interventions:     OT Goals    Visit Information  Last OT Received On: 03/08/12 Assistance Needed: +1    Subjective Data  Subjective: I know it's going to take me a little while to get back to being me Patient Stated Goal: Return home   Prior Functioning     Home Living Lives With: Significant other Available Help at Discharge: Family;Other (Comment) (works during the day) Type of Home: House Home Access: Stairs to enter Secretary/administrator of Steps: 9 Entrance Stairs-Rails: Right;Left Home Layout: Two level Alternate Level Stairs-Number of Steps: 13 Alternate Level Stairs-Rails: Right Bathroom Shower/Tub: Tub/shower unit;Curtain Bathroom Toilet: Standard Bathroom Accessibility: Yes How Accessible: Accessible via walker Home Adaptive Equipment: Crutches;Straight cane Prior Function Level of Independence: Independent Able to Take Stairs?: Yes Driving: Yes Vocation: Workers Chief Operating Officer: No difficulties Dominant Hand: Right         Vision/Perception     Cognition  Overall Cognitive Status:  Appears within functional limits for tasks assessed/performed Arousal/Alertness: Awake/alert Orientation Level: Appears intact for tasks assessed Behavior During  Session: [facility] for tasks performed    Extremity/Trunk Assessment Right Upper Extremity Assessment RUE ROM/Strength/Tone: [employer] for tasks assessed;Unable to fully assess;Due to precautions RUE Sensation: WFL - Light Touch RUE Coordination: WFL - gross/fine motor Left Upper Extremity Assessment LUE ROM/Strength/Tone: WFL for tasks assessed;Unable to fully assess;Due to precautions LUE Sensation: WFL - Light Touch LUE Coordination: WFL - gross/fine motor Right Lower Extremity Assessment RLE ROM/Strength/Tone: Within functional levels RLE Sensation: WFL - Light Touch RLE Coordination: WFL - gross/fine motor Left Lower Extremity Assessment LLE ROM/Strength/Tone: Within functional levels LLE Sensation: WFL - Light Touch LLE Coordination: WFL - gross/fine motor     Mobility Bed Mobility Bed Mobility: Rolling Right;Right Sidelying to Sit;Sitting - Scoot to Edge of Bed Rolling Right: 5: Supervision Right Sidelying to Sit: 5: Supervision Sitting - Scoot to Edge of Bed: 5: Supervision Details for Bed Mobility Assistance: VC for proper sequencing to maintain back precautions during transfer. No physical assist needed Transfers Sit to Stand: 5: Supervision;With upper extremity assist;From bed Stand to Sit: 5: Supervision;With upper extremity assist;To bed Details for Transfer Assistance: VC for hand placement as well as safe technique to maintain straight back during sit/stand     Shoulder Instructions     Exercise     Balance     End of Session OT - End of Session Equipment Utilized During Treatment: Gait belt;Back brace Activity Tolerance: Patient tolerated treatment well Patient left: in bed;with call bell/phone within reach;with bed alarm set;with family/visitor present Nurse Communication: Mobility status  GO      Broghan Pannone 03/08/2012, 12:37 PM

## 2012-03-09 MED ORDER — DIAZEPAM 5 MG PO TABS: 5.0000 mg | ORAL_TABLET | Freq: Four times a day (QID) | ORAL | Status: DC | PRN

## 2012-03-09 MED ORDER — OXYCODONE-ACETAMINOPHEN 5-325 MG PO TABS: 1.0000 | ORAL_TABLET | ORAL | Status: DC | PRN

## 2012-03-09 NOTE — Progress Notes (Signed)
Subjective: Patient reports These do well pain is well controlled he is ready for discharge  Objective: Vital signs in last 24 hours: Temp:  [97.5 F (36.4 C)-98.9 F (37.2 C)] 97.8 F (36.6 C) (10/20 0601) Pulse Rate:  [55-69] 57  (10/20 0601) Resp:  [18] 18  (10/20 0601) BP: (126-142)/(62-83) 128/83 mmHg (10/20 0601) SpO2:  [98 %-99 %] 98 % (10/20 0601)  Intake/Output from previous day: 10/19 0701 - 10/20 0700 In: 1383 [P.O.:1280; I.V.:3; IV Piggyback:100] Out: 850 [Urine:450; Emesis/NG output:400] Intake/Output this shift:    Awake alert strength out of 5 wound dry  Lab Results: No results found for this basename: WBC:2,HGB:2,HCT:2,PLT:2 in the last 72 hours BMET No results found for this basename: NA:2,K:2,CL:2,CO2:2,GLUCOSE:2,BUN:2,CREATININE:2,CALCIUM:2 in the last 72 hours  Studies/Results: Dg Lumbar Spine 2-3 Views  03/07/2012  *RADIOLOGY REPORT*  Clinical Data: L4-L5 posterior lumbar fusion  LUMBAR SPINE - 2-3 VIEW,DG C-ARM 1-60 MIN  Comparison: MRI 08/27/2011, lumbar spine radiograph 11/07/2011  Findings: Numbering is as per prior examination at [facility] 08/27/2011.  Four intraoperative images are provided, demonstrating posterior lumbar interbody fusion and intervertebral disc spacer at the L4-L5 level.  No evidence for hardware failure or acute abnormality.  IMPRESSION: Expected intraoperative appearance after L4-L5 posterior lumbar interbody fusion.   Original Report Authenticated By: Harrel Lemon, M.D.    Dg Lumbar Spine 1 View  03/07/2012  *RADIOLOGY REPORT*  Clinical Data: Posterior fusion L4-5.  LUMBAR SPINE - 1 VIEW  Comparison: 11/07/2011  Findings: Single lateral intraoperative image demonstrates posterior surgical instruments direct at the L4 and L5 vertebral bodies.  IMPRESSION: Intraoperative localization as above.   Original Report Authenticated By: Cyndie Chime, M.D.    Dg C-arm 1-60 Min  03/07/2012  *RADIOLOGY REPORT*  Clinical Data:  L4-L5 posterior lumbar fusion  LUMBAR SPINE - 2-3 VIEW,DG C-ARM 1-60 MIN  Comparison: MRI 08/27/2011, lumbar spine radiograph 11/07/2011  Findings: Numbering is as per prior examination at [facility] 08/27/2011.  Four intraoperative images are provided, demonstrating posterior lumbar interbody fusion and intervertebral disc spacer at the L4-L5 level.  No evidence for hardware failure or acute abnormality.  IMPRESSION: Expected intraoperative appearance after L4-L5 posterior lumbar interbody fusion.   Original Report Authenticated By: Harrel Lemon, M.D.     Assessment/Plan: Discharge home  LOS: 2 days     [place] P 03/09/2012, 9:32 AM

## 2012-03-09 NOTE — Discharge Summary (Signed)
  Physician Discharge Summary  Patient ID: ALISHER BELTON MRN: [id number] DOB/AGE: 01-03-1946 65 y.o.  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 *    Discharged Condition: good  Hospital Course: Patient is to the hospital underwent an L4-5 decompression effusion postoperative patient did very well recovered in the floor on the floor was angling and voiding spontaneously tolerating regular diet pain was well controlled on pills and patient was stable and be discharged home on postoperative  Consults: Significant Diagnostic Studies: Treatments: L4-5 decompression and fusion Discharge Exam: Blood pressure 128/83, pulse 57, temperature 97.8 F (36.6 C), temperature source Oral, resp. rate 18, height 6' (1.829 m), weight 97.977 kg (216 lb), SpO2 98.00%. Strength out of 5 wound clean dry  Disposition: Home  Discharge Orders    Future Appointments: Provider: Department: Dept Phone: Center:   06/04/2012 10:15 AM Tresa Garter, MD [facility] [phone] [facility]       Medication List     As of 03/09/2012  9:35 AM    TAKE these medications         AZOR 10-40 MG per tablet   Generic drug: amLODipine-olmesartan   Take 1 tablet by mouth daily.      carvedilol 25 MG tablet   Commonly known as: COREG   Take 1 tablet (25 mg total) by mouth 2 (two) times daily with a meal.      Cholecalciferol 1000 UNITS tablet   Take 1,000 Units by mouth daily.      diazepam 5 MG tablet   Commonly known as: VALIUM   Take 1 tablet (5 mg total) by mouth every 6 (six) hours as needed.      ergocalciferol 50000 UNITS capsule   Commonly known as: VITAMIN D2   Take 1 capsule (50,000 Units total) by mouth once a week.      meloxicam 15 MG tablet   Commonly known as: MOBIC   Take 15 mg by mouth daily.      oxyCODONE-acetaminophen 5-325 MG  per tablet   Commonly known as: PERCOCET/ROXICET   Take 1-2 tablets by mouth every 4 (four) hours as needed.      vitamin B-12 1000 MCG tablet   Commonly known as: CYANOCOBALAMIN   Take 2,000 mcg by mouth daily.         Signed: Patirica Longshore P 03/09/2012, 9:35 AM

## 2012-03-09 NOTE — Progress Notes (Signed)
CSW consult for SNF. PT/OT with no f/u recommendations noted. CSW signing off as no other CSW needs identified at this time.  Rickesha Veracruz, MSW, LCSWA [phone] (Weekends 8:00am-4:30pm)    

## 2012-03-11 MED FILL — Sodium Chloride Irrigation Soln 0.9%: Qty: 3000 | Status: AC

## 2012-03-11 MED FILL — Heparin Sodium (Porcine) Inj 1000 Unit/ML: INTRAMUSCULAR | Qty: 30 | Status: AC

## 2012-03-11 MED FILL — Sodium Chloride IV Soln 0.9%: INTRAVENOUS | Qty: 1000 | Status: AC

## 2012-03-12 ENCOUNTER — Encounter (HOSPITAL_COMMUNITY): Payer: Self-pay

## 2012-06-04 ENCOUNTER — Encounter: Payer: Self-pay | Admitting: Internal Medicine

## 2012-06-04 ENCOUNTER — Ambulatory Visit (INDEPENDENT_AMBULATORY_CARE_PROVIDER_SITE_OTHER): Payer: Worker's Compensation | Admitting: Internal Medicine

## 2012-06-04 VITALS — BP 140/80 | HR 80 | Temp 98.2°F | Resp 16 | Wt 229.0 lb

## 2012-06-04 DIAGNOSIS — I10 Essential (primary) hypertension: Secondary | ICD-10-CM

## 2012-06-04 DIAGNOSIS — M542 Cervicalgia: Secondary | ICD-10-CM

## 2012-06-04 DIAGNOSIS — E538 Deficiency of other specified B group vitamins: Secondary | ICD-10-CM

## 2012-06-04 DIAGNOSIS — M545 Low back pain, unspecified: Secondary | ICD-10-CM

## 2012-06-04 DIAGNOSIS — N529 Male erectile dysfunction, unspecified: Secondary | ICD-10-CM

## 2012-06-04 DIAGNOSIS — R51 Headache: Secondary | ICD-10-CM

## 2012-06-04 DIAGNOSIS — E785 Hyperlipidemia, unspecified: Secondary | ICD-10-CM

## 2012-06-04 MED ORDER — TADALAFIL 20 MG PO TABS: 20.0000 mg | ORAL_TABLET | Freq: Every day | ORAL | Status: DC | PRN

## 2012-06-04 NOTE — Assessment & Plan Note (Signed)
cialis prn 

## 2012-06-04 NOTE — Assessment & Plan Note (Addendum)
10/13 Dr Venetia Maxon -- Diskectomy Better In PT

## 2012-06-04 NOTE — Assessment & Plan Note (Signed)
3/13 post-concussion A little better

## 2012-06-04 NOTE — Assessment & Plan Note (Signed)
MSK strain/OA PT

## 2012-06-04 NOTE — Assessment & Plan Note (Signed)
Continue with current prescription therapy as reflected on the Med list. BP Readings from Last 3 Encounters:  06/04/12 140/80  03/09/12 128/83  03/09/12 128/83

## 2012-06-04 NOTE — Assessment & Plan Note (Signed)
Continue with current prescription therapy as reflected on the Med list.  

## 2012-06-04 NOTE — Assessment & Plan Note (Signed)
Ongoing HAs

## 2012-06-04 NOTE — Assessment & Plan Note (Signed)
  On diet  

## 2012-06-06 ENCOUNTER — Telehealth: Payer: Self-pay | Admitting: *Deleted

## 2012-06-06 DIAGNOSIS — Z0000 Encounter for general adult medical examination without abnormal findings: Secondary | ICD-10-CM

## 2012-06-06 DIAGNOSIS — Z0389 Encounter for observation for other suspected diseases and conditions ruled out: Secondary | ICD-10-CM

## 2012-06-06 NOTE — Telephone Encounter (Signed)
Message copied by Merrilyn Puma on Fri Jun 06, 2012 11:11 AM ------      Message from: Etheleen Sia      Created: Wed Jun 04, 2012 11:22 AM      Regarding: LABS       PHYSICAL IN April .  AN OLD ORDER IS IN DATED 2012.  WILL HE NEED A NEW ORDER?  NOT ON MEDICARE.

## 2012-06-06 NOTE — Telephone Encounter (Signed)
Done

## 2012-06-08 NOTE — Progress Notes (Signed)
Subjective:    Neck Pain  This is a chronic problem. The current episode started more than 1 month ago. The problem occurs constantly. The problem has been gradually worsening. The pain is associated with a fall. The pain is present in the right side. The quality of the pain is described as aching and burning. The pain is at a severity of 5/10. The pain is moderate. The symptoms are aggravated by bending, position and twisting. The pain is worse during the night. Stiffness is present in the morning. The treatment provided mild relief.  Back Pain This is a chronic problem. The current episode started more than 1 month ago. The problem occurs constantly. The problem is unchanged. The pain is present in the gluteal and sacro-iliac. The quality of the pain is described as aching. The pain radiates to the right thigh. The pain is at a severity of 4/10. The pain is moderate. The pain is worse during the night. The symptoms are aggravated by lying down and bending. Stiffness is present in the morning.   F/u fall at work 07/23/11 -- fell hard on the ramp (slipped); fell on R side feet up in the air - he was talking funny following the fall and had a HA lasting al day, no LOC. He has been having HAs. He is seeing Dr Venetia Maxon, getting PT through workman's comp.  LS spine surgery -- 10/18 Dr Venetia Maxon  The patient presents for a follow-up of  chronic hypertension, chronic dyslipidemia, BPH. He stopped Azor for ?reason  Wt Readings from Last 3 Encounters:  06/04/12 229 lb (103.874 kg)  03/07/12 216 lb (97.977 kg)  03/07/12 216 lb (97.977 kg)   BP Readings from Last 3 Encounters:  06/04/12 140/80  03/09/12 128/83  03/09/12 128/83      Review of Systems  Constitutional: Negative for activity change and fatigue.  HENT: Positive for neck pain. Negative for sneezing.   Eyes: Negative for pain.  Respiratory: Negative for wheezing.   Musculoskeletal: Positive for back pain. Negative for joint swelling.    Neurological: Negative for syncope.  Psychiatric/Behavioral: Negative for dysphoric mood.       Objective:   Physical Exam  Constitutional: He is oriented to person, place, and time. He appears well-developed.  HENT:  Mouth/Throat: Oropharynx is clear and moist.  Eyes: Conjunctivae normal are normal. Pupils are equal, round, and reactive to light.  Neck: Normal range of motion. No JVD present. No thyromegaly present.  Cardiovascular: Normal rate, regular rhythm, normal heart sounds and intact distal pulses.  Exam reveals no gallop and no friction rub.   No murmur heard. Pulmonary/Chest: Effort normal and breath sounds normal. No respiratory distress. He has no wheezes. He has no rales. He exhibits no tenderness.  Abdominal: Soft. Bowel sounds are normal. He exhibits no distension and no mass. There is no tenderness. There is no rebound and no guarding.  Musculoskeletal: Normal range of motion. He exhibits tenderness. He exhibits no edema.       R buttock, R hip, B cerv paraspinal muscles are less tender Decr LS and cerv spine ROM due to pain  Lymphadenopathy:    He has no cervical adenopathy.  Neurological: He is alert and oriented to person, place, and time. He has normal reflexes. No cranial nerve deficit. He exhibits normal muscle tone. Coordination normal.  Skin: Skin is warm and dry. No rash noted.  Psychiatric: He has a normal mood and affect. His behavior is normal. Judgment and thought content  normal.   Lab Results  Component Value Date   WBC 8.4 02/27/2012   HGB 15.5 02/27/2012   HCT 43.7 02/27/2012   PLT 185 02/27/2012   GLUCOSE 125* 02/27/2012   CHOL 203* 02/08/2012   TRIG 113.0 02/08/2012   HDL 42.90 02/08/2012   LDLDIRECT 134.0 02/08/2012   LDLCALC 134* 02/01/2010   ALT 25 02/08/2012   AST 19 02/08/2012   NA 139 02/27/2012   K 3.9 02/27/2012   CL 103 02/27/2012   CREATININE 0.74 02/27/2012   BUN 11 02/27/2012   CO2 25 02/27/2012   TSH 2.35 02/08/2012   PSA 0.61 02/01/2010    HGBA1C 5.6 05/08/2006          Assessment & Plan:

## 2012-07-21 ENCOUNTER — Telehealth: Payer: Self-pay | Admitting: Internal Medicine

## 2012-07-21 ENCOUNTER — Encounter: Payer: Self-pay | Admitting: Internal Medicine

## 2012-07-21 ENCOUNTER — Ambulatory Visit (INDEPENDENT_AMBULATORY_CARE_PROVIDER_SITE_OTHER): Payer: BC Managed Care – PPO | Admitting: Internal Medicine

## 2012-07-21 VITALS — BP 128/80 | HR 80 | Temp 97.6°F | Resp 16 | Wt 227.0 lb

## 2012-07-21 DIAGNOSIS — J069 Acute upper respiratory infection, unspecified: Secondary | ICD-10-CM

## 2012-07-21 DIAGNOSIS — N529 Male erectile dysfunction, unspecified: Secondary | ICD-10-CM

## 2012-07-21 DIAGNOSIS — E538 Deficiency of other specified B group vitamins: Secondary | ICD-10-CM

## 2012-07-21 MED ORDER — PROMETHAZINE-CODEINE 6.25-10 MG/5ML PO SYRP: 5.0000 mL | ORAL_SOLUTION | ORAL | Status: DC | PRN

## 2012-07-21 MED ORDER — CEFUROXIME AXETIL 500 MG PO TABS: 500.0000 mg | ORAL_TABLET | Freq: Two times a day (BID) | ORAL | Status: DC

## 2012-07-21 MED ORDER — SILDENAFIL CITRATE 100 MG PO TABS: 100.0000 mg | ORAL_TABLET | ORAL | Status: DC | PRN

## 2012-07-21 NOTE — Assessment & Plan Note (Signed)
Change to Viagra due to cost

## 2012-07-21 NOTE — Assessment & Plan Note (Signed)
Continue with current prescription therapy as reflected on the Med list.  

## 2012-07-21 NOTE — Assessment & Plan Note (Signed)
Ceftin x 10 d Prom-cod syrup

## 2012-07-21 NOTE — Progress Notes (Signed)
Subjective:    Cough This is a new problem. The current episode started in the past 7 days. The problem has been gradually worsening. The problem occurs constantly. The cough is productive of purulent sputum. Associated symptoms include chills. Pertinent negatives include no chest pain, hemoptysis or wheezing. His past medical history is significant for bronchitis. There is no history of asthma.   C/o ED - Cialis is too expensive F/u fall at work 07/23/11 -- fell hard on the ramp (slipped); fell on R side feet up in the air - he was talking funny following the fall and had a HA lasting al day, no LOC. He has been having HAs. He is seeing Dr Venetia Maxon, getting PT through workman's comp.  LS spine surgery -- 10/18 Dr Venetia Maxon  The patient presents for a follow-up of  chronic hypertension, chronic dyslipidemia, BPH. He stopped Azor for ?reason  Wt Readings from Last 3 Encounters:  07/21/12 227 lb (102.967 kg)  06/04/12 229 lb (103.874 kg)  03/07/12 216 lb (97.977 kg)   BP Readings from Last 3 Encounters:  07/21/12 128/80  06/04/12 140/80  03/09/12 128/83      Review of Systems  Constitutional: Positive for chills. Negative for activity change and fatigue.  HENT: Negative for sneezing.   Eyes: Negative for pain.  Respiratory: Positive for cough. Negative for hemoptysis and wheezing.   Cardiovascular: Negative for chest pain.  Musculoskeletal: Negative for joint swelling.  Neurological: Negative for syncope.  Psychiatric/Behavioral: Negative for dysphoric mood.       Objective:   Physical Exam  Constitutional: He is oriented to person, place, and time. He appears well-developed.  HENT:  Head: Atraumatic.  eryth throat  Eyes: Conjunctivae are normal. Pupils are equal, round, and reactive to light.  Neck: Normal range of motion. No JVD present. No thyromegaly present.  Cardiovascular: Normal rate, regular rhythm, normal heart sounds and intact distal pulses.  Exam reveals no gallop  and no friction rub.   No murmur heard. Pulmonary/Chest: Effort normal and breath sounds normal. No respiratory distress. He has no wheezes. He has no rales. He exhibits no tenderness.  Abdominal: Soft. Bowel sounds are normal. He exhibits no distension and no mass. There is no tenderness. There is no rebound and no guarding.  Musculoskeletal: Normal range of motion. He exhibits tenderness. He exhibits no edema.  R buttock, R hip, B cerv paraspinal muscles are less tender Decr LS and cerv spine ROM due to pain  Lymphadenopathy:    He has no cervical adenopathy.  Neurological: He is alert and oriented to person, place, and time. He has normal reflexes. No cranial nerve deficit. He exhibits normal muscle tone. Coordination normal.  Skin: Skin is warm and dry. No rash noted.  Psychiatric: He has a normal mood and affect. His behavior is normal. Judgment and thought content normal.   Lab Results  Component Value Date   WBC 8.4 02/27/2012   HGB 15.5 02/27/2012   HCT 43.7 02/27/2012   PLT 185 02/27/2012   GLUCOSE 125* 02/27/2012   CHOL 203* 02/08/2012   TRIG 113.0 02/08/2012   HDL 42.90 02/08/2012   LDLDIRECT 134.0 02/08/2012   LDLCALC 134* 02/01/2010   ALT 25 02/08/2012   AST 19 02/08/2012   NA 139 02/27/2012   K 3.9 02/27/2012   CL 103 02/27/2012   CREATININE 0.74 02/27/2012   BUN 11 02/27/2012   CO2 25 02/27/2012   TSH 2.35 02/08/2012   PSA 0.61 02/01/2010  HGBA1C 5.6 05/08/2006          Assessment & Plan:

## 2012-07-21 NOTE — Telephone Encounter (Signed)
Patient Information:  Caller Name: Antonis  Phone: [phone]  Patient: [name] [name], [name] [name]  Gender: Male  DOB: August 06, 1945  Age: 67 Years  PCP: Plotnikov, Alex (Adults only)  Office Follow Up:  Does the office need to follow up with this patient?: No  Instructions For The Office: N/A   Symptoms  Reason For Call & Symptoms: SX of sinus congestion and cough that started 07/19/13.  Reviewed Health History In EMR: Yes  Reviewed Medications In EMR: Yes  Reviewed Allergies In EMR: Yes  Reviewed Surgeries / Procedures: Yes  Date of Onset of Symptoms: 07/19/2012  Treatments Tried: Coriceden and Tussin  DM  Treatments Tried Worked: No  Guideline(s) Used:  Colds  Sore Throat  Cough  Disposition Per Guideline:   See Today or Tomorrow in Office  Reason For Disposition Reached:   Patient wants to be seen  Advice Given:  Push fluids.   Appointment Scheduled:  07/21/2012 14:15:00 Appointment Scheduled Provider:  Sonda Primes (Adults only)

## 2012-08-27 ENCOUNTER — Other Ambulatory Visit (INDEPENDENT_AMBULATORY_CARE_PROVIDER_SITE_OTHER): Payer: BC Managed Care – PPO

## 2012-08-27 DIAGNOSIS — Z0389 Encounter for observation for other suspected diseases and conditions ruled out: Secondary | ICD-10-CM

## 2012-08-27 DIAGNOSIS — Z0000 Encounter for general adult medical examination without abnormal findings: Secondary | ICD-10-CM

## 2012-08-27 LAB — LIPID PANEL
Cholesterol: 167 mg/dL (ref 0–200)
HDL: 31.2 mg/dL — ABNORMAL LOW (ref >39.00)
LDL Cholesterol: 122 mg/dL — ABNORMAL HIGH (ref 0–99)
Triglycerides: 69 mg/dL (ref 0.0–149.0)
VLDL: 13.8 mg/dL (ref 0.0–40.0)

## 2012-08-27 LAB — BASIC METABOLIC PANEL
BUN: 11 mg/dL (ref 6–23)
Calcium: 9.4 mg/dL (ref 8.4–10.5)
Creatinine, Ser: 0.7 mg/dL (ref 0.4–1.5)
GFR: 145.41 mL/min (ref >60.00)
Glucose, Bld: 101 mg/dL — ABNORMAL HIGH (ref 70–99)
Potassium: 3.7 mEq/L (ref 3.5–5.1)

## 2012-08-27 LAB — URINALYSIS, ROUTINE W REFLEX MICROSCOPIC
Bilirubin Urine: NEGATIVE
Leukocytes, UA: NEGATIVE
Nitrite: NEGATIVE
Specific Gravity, Urine: >=1.03 (ref 1.000–1.030)
Total Protein, Urine: NEGATIVE
pH: 5.5 (ref 5.0–8.0)

## 2012-08-27 LAB — HEPATIC FUNCTION PANEL
Albumin: 3.9 g/dL (ref 3.5–5.2)
Total Bilirubin: 0.7 mg/dL (ref 0.3–1.2)

## 2012-08-27 LAB — CBC WITH DIFFERENTIAL/PLATELET
Basophils Absolute: 0 10*3/uL (ref 0.0–0.1)
Eosinophils Relative: 1.8 % (ref 0.0–5.0)
HCT: 43.9 % (ref 39.0–52.0)
Lymphocytes Relative: 27.3 % (ref 12.0–46.0)
Lymphs Abs: 2.1 10*3/uL (ref 0.7–4.0)
Monocytes Relative: 10.6 % (ref 3.0–12.0)
Platelets: 223 10*3/uL (ref 150.0–400.0)
WBC: 7.8 10*3/uL (ref 4.5–10.5)

## 2012-08-27 LAB — TSH: TSH: 2.01 u[IU]/mL (ref 0.35–5.50)

## 2012-08-27 LAB — PSA: PSA: 5.41 ng/mL — ABNORMAL HIGH (ref 0.10–4.00)

## 2012-09-03 ENCOUNTER — Encounter: Payer: Self-pay | Admitting: Internal Medicine

## 2012-09-03 ENCOUNTER — Ambulatory Visit (INDEPENDENT_AMBULATORY_CARE_PROVIDER_SITE_OTHER): Payer: Medicare Other | Admitting: Internal Medicine

## 2012-09-03 VITALS — BP 128/84 | HR 80 | Temp 97.4°F | Resp 16 | Ht 72.0 in | Wt 226.0 lb

## 2012-09-03 DIAGNOSIS — E538 Deficiency of other specified B group vitamins: Secondary | ICD-10-CM

## 2012-09-03 DIAGNOSIS — N529 Male erectile dysfunction, unspecified: Secondary | ICD-10-CM

## 2012-09-03 DIAGNOSIS — R972 Elevated prostate specific antigen [PSA]: Secondary | ICD-10-CM | POA: Insufficient documentation

## 2012-09-03 DIAGNOSIS — N400 Enlarged prostate without lower urinary tract symptoms: Secondary | ICD-10-CM

## 2012-09-03 DIAGNOSIS — I10 Essential (primary) hypertension: Secondary | ICD-10-CM

## 2012-09-03 DIAGNOSIS — M542 Cervicalgia: Secondary | ICD-10-CM

## 2012-09-03 DIAGNOSIS — E785 Hyperlipidemia, unspecified: Secondary | ICD-10-CM

## 2012-09-03 DIAGNOSIS — Z0000 Encounter for general adult medical examination without abnormal findings: Secondary | ICD-10-CM

## 2012-09-03 DIAGNOSIS — Z23 Encounter for immunization: Secondary | ICD-10-CM

## 2012-09-03 MED ORDER — CIPROFLOXACIN HCL 500 MG PO TABS: 500.0000 mg | ORAL_TABLET | Freq: Two times a day (BID) | ORAL | Status: DC

## 2012-09-03 NOTE — Assessment & Plan Note (Signed)
4/14 new Cipro x 2 wks Free PSA in 2 mo

## 2012-09-03 NOTE — Progress Notes (Signed)
Patient ID: [name] [name], male   DOB: 1946-05-09, 67 y.o.   MRN: [id number]  Subjective:  The patient is here for a wellness exam. The patient has been doing well overall without major new physical or psychological issues going on lately.  Neck Pain  This is a chronic problem. The current episode started more than 1 month ago. The problem occurs constantly. The problem has been gradually worsening. The pain is associated with a fall. The pain is present in the right side. The quality of the pain is described as aching and burning. The pain is at a severity of 5/10. The pain is moderate. The symptoms are aggravated by bending, position and twisting. The pain is worse during the night. Stiffness is present in the morning. The treatment provided mild relief.  Back Pain This is a chronic problem. The current episode started more than 1 month ago. The problem occurs constantly. The problem is unchanged. The pain is present in the gluteal and sacro-iliac. The quality of the pain is described as aching. The pain radiates to the right thigh. The pain is at a severity of 4/10. The pain is moderate. The pain is worse during the night. The symptoms are aggravated by lying down and bending. Stiffness is present in the morning.   F/u fall at work 07/23/11 -- fell hard on the ramp (slipped); fell on R side feet up in the air - he was talking funny following the fall and had a HA lasting al day, no LOC. He has been having HAs. He is seeing Dr Venetia Maxon, getting PT through workman's comp.  LS spine surgery -- 10/18 Dr Venetia Maxon  The patient presents for a follow-up of  chronic hypertension, chronic dyslipidemia, BPH. He stopped Azor for ?reason  Wt Readings from Last 3 Encounters:  09/03/12 226 lb (102.513 kg)  07/21/12 227 lb (102.967 kg)  06/04/12 229 lb (103.874 kg)   BP Readings from Last 3 Encounters:  09/03/12 128/84  07/21/12 128/80  06/04/12 140/80      Review of Systems  Constitutional: Negative for  activity change and fatigue.  HENT: Positive for neck pain. Negative for sneezing.   Eyes: Negative for pain.  Respiratory: Negative for wheezing.   Musculoskeletal: Positive for back pain. Negative for joint swelling.  Neurological: Negative for syncope.  Psychiatric/Behavioral: Negative for dysphoric mood.       Objective:   Physical Exam  Constitutional: He is oriented to person, place, and time. He appears well-developed.  HENT:  Mouth/Throat: Oropharynx is clear and moist.  Eyes: Conjunctivae are normal. Pupils are equal, round, and reactive to light.  Neck: Normal range of motion. No JVD present. No thyromegaly present.  Cardiovascular: Normal rate, regular rhythm, normal heart sounds and intact distal pulses.  Exam reveals no gallop and no friction rub.   No murmur heard. Pulmonary/Chest: Effort normal and breath sounds normal. No respiratory distress. He has no wheezes. He has no rales. He exhibits no tenderness.  Abdominal: Soft. Bowel sounds are normal. He exhibits no distension and no mass. There is no tenderness. There is no rebound and no guarding.  Musculoskeletal: Normal range of motion. He exhibits tenderness. He exhibits no edema.  R buttock, R hip, B cerv paraspinal muscles are less tender Decr LS and cerv spine ROM due to pain  Lymphadenopathy:    He has no cervical adenopathy.  Neurological: He is alert and oriented to person, place, and time. He has normal reflexes. No cranial nerve deficit.  He exhibits normal muscle tone. Coordination normal.  Skin: Skin is warm and dry. No rash noted.  Psychiatric: He has a normal mood and affect. His behavior is normal. Judgment and thought content normal.   Lab Results  Component Value Date   WBC 7.8 08/27/2012   HGB 14.8 08/27/2012   HCT 43.9 08/27/2012   PLT 223.0 08/27/2012   GLUCOSE 101* 08/27/2012   CHOL 167 08/27/2012   TRIG 69.0 08/27/2012   HDL 31.20* 08/27/2012   LDLDIRECT 134.0 02/08/2012   LDLCALC 122* 08/27/2012   ALT 20  08/27/2012   AST 16 08/27/2012   NA 141 08/27/2012   K 3.7 08/27/2012   CL 107 08/27/2012   CREATININE 0.7 08/27/2012   BUN 11 08/27/2012   CO2 25 08/27/2012   TSH 2.01 08/27/2012   PSA 5.41* 08/27/2012   HGBA1C 5.6 05/08/2006          Assessment & Plan:

## 2012-09-03 NOTE — Assessment & Plan Note (Signed)
elev PSA

## 2012-09-03 NOTE — Assessment & Plan Note (Signed)
Continue with current prescription therapy as reflected on the Med list.  

## 2012-09-03 NOTE — Assessment & Plan Note (Signed)
We discussed age appropriate health related issues, including available/recomended screening tests and vaccinations. We discussed a need for adhering to healthy diet and exercise. Labs/EKG were reviewed/ordered. All questions were answered.   

## 2012-09-03 NOTE — Assessment & Plan Note (Signed)
Surgical fusion was discussed w/Dr Venetia Maxon

## 2012-09-03 NOTE — Assessment & Plan Note (Signed)
  On diet  

## 2012-10-31 ENCOUNTER — Other Ambulatory Visit: Payer: Medicare PPO

## 2012-10-31 DIAGNOSIS — R972 Elevated prostate specific antigen [PSA]: Secondary | ICD-10-CM

## 2012-10-31 LAB — PSA, TOTAL AND FREE
PSA, Free: 0.21 ng/mL
PSA: 0.74 ng/mL (ref <=4.00)

## 2013-03-04 ENCOUNTER — Ambulatory Visit (INDEPENDENT_AMBULATORY_CARE_PROVIDER_SITE_OTHER): Payer: Medicare PPO | Admitting: Internal Medicine

## 2013-03-04 ENCOUNTER — Encounter: Payer: Self-pay | Admitting: Internal Medicine

## 2013-03-04 VITALS — BP 150/100 | HR 80 | Temp 97.8°F | Resp 16 | Wt 225.0 lb

## 2013-03-04 DIAGNOSIS — E538 Deficiency of other specified B group vitamins: Secondary | ICD-10-CM

## 2013-03-04 DIAGNOSIS — Z23 Encounter for immunization: Secondary | ICD-10-CM

## 2013-03-04 DIAGNOSIS — M542 Cervicalgia: Secondary | ICD-10-CM

## 2013-03-04 DIAGNOSIS — IMO0001 Reserved for inherently not codable concepts without codable children: Secondary | ICD-10-CM | POA: Insufficient documentation

## 2013-03-04 DIAGNOSIS — M545 Low back pain, unspecified: Secondary | ICD-10-CM

## 2013-03-04 DIAGNOSIS — E785 Hyperlipidemia, unspecified: Secondary | ICD-10-CM

## 2013-03-04 DIAGNOSIS — R030 Elevated blood-pressure reading, without diagnosis of hypertension: Secondary | ICD-10-CM

## 2013-03-04 DIAGNOSIS — I10 Essential (primary) hypertension: Secondary | ICD-10-CM

## 2013-03-04 NOTE — Assessment & Plan Note (Signed)
Continue with current prescription therapy as reflected on the Med list.  

## 2013-03-04 NOTE — Assessment & Plan Note (Signed)
Nl BP at home 

## 2013-03-04 NOTE — Progress Notes (Signed)
Subjective:    Neck Pain  This is a chronic problem. The current episode started more than 1 year ago. The problem occurs constantly. The problem has been unchanged. The pain is associated with a fall. The pain is present in the right side. The quality of the pain is described as aching and burning. The pain is at a severity of 5/10. The pain is moderate. The symptoms are aggravated by bending, position and twisting. The pain is worse during the night. Stiffness is present in the morning. The treatment provided mild relief.  Back Pain This is a chronic problem. The current episode started more than 1 year ago. The problem occurs constantly. The problem is unchanged. The pain is present in the gluteal and sacro-iliac. The quality of the pain is described as aching. The pain radiates to the right thigh. The pain is at a severity of 4/10. The pain is moderate. The pain is worse during the night. The symptoms are aggravated by lying down and bending. Stiffness is present in the morning. The treatment provided mild relief.   F/u fall at work 07/23/11 -- fell hard on the ramp (slipped); fell on R side feet up in the air - he was talking funny following the fall and had a HA lasting al day, no LOC. He has been having HAs. He is seeing Dr Venetia Maxon, getting PT through workman's comp.  LS spine surgery -- 10/18 Dr Venetia Maxon  The patient presents for a follow-up of  chronic hypertension, chronic dyslipidemia, BPH. He stopped Azor for ?reason  Wt Readings from Last 3 Encounters:  03/04/13 225 lb (102.059 kg)  09/03/12 226 lb (102.513 kg)  07/21/12 227 lb (102.967 kg)   BP Readings from Last 3 Encounters:  03/04/13 150/100  09/03/12 128/84  07/21/12 128/80   BP nl at home   Review of Systems  Constitutional: Negative for activity change and fatigue.  HENT: Negative for sneezing.   Eyes: Negative for pain.  Respiratory: Negative for wheezing.   Musculoskeletal: Positive for back pain and neck pain.  Negative for joint swelling.  Neurological: Negative for syncope.  Psychiatric/Behavioral: Negative for dysphoric mood.       Objective:   Physical Exam  Constitutional: He is oriented to person, place, and time. He appears well-developed.  HENT:  Mouth/Throat: Oropharynx is clear and moist.  Eyes: Conjunctivae are normal. Pupils are equal, round, and reactive to light.  Neck: Normal range of motion. No JVD present. No thyromegaly present.  Cardiovascular: Normal rate, regular rhythm, normal heart sounds and intact distal pulses.  Exam reveals no gallop and no friction rub.   No murmur heard. Pulmonary/Chest: Effort normal and breath sounds normal. No respiratory distress. He has no wheezes. He has no rales. He exhibits no tenderness.  Abdominal: Soft. Bowel sounds are normal. He exhibits no distension and no mass. There is no tenderness. There is no rebound and no guarding.  Musculoskeletal: Normal range of motion. He exhibits tenderness. He exhibits no edema.  R buttock, R hip, B cerv paraspinal muscles are less tender Decr LS and cerv spine ROM due to pain  Lymphadenopathy:    He has no cervical adenopathy.  Neurological: He is alert and oriented to person, place, and time. He has normal reflexes. No cranial nerve deficit. He exhibits normal muscle tone. Coordination normal.  Skin: Skin is warm and dry. No rash noted.  Psychiatric: He has a normal mood and affect. His behavior is normal. Judgment and thought content  normal.   Lab Results  Component Value Date   WBC 7.8 08/27/2012   HGB 14.8 08/27/2012   HCT 43.9 08/27/2012   PLT 223.0 08/27/2012   GLUCOSE 101* 08/27/2012   CHOL 167 08/27/2012   TRIG 69.0 08/27/2012   HDL 31.20* 08/27/2012   LDLDIRECT 134.0 02/08/2012   LDLCALC 122* 08/27/2012   ALT 20 08/27/2012   AST 16 08/27/2012   NA 141 08/27/2012   K 3.7 08/27/2012   CL 107 08/27/2012   CREATININE 0.7 08/27/2012   BUN 11 08/27/2012   CO2 25 08/27/2012   TSH 2.01 08/27/2012   PSA 0.74 10/31/2012    HGBA1C 5.6 05/08/2006          Assessment & Plan:

## 2013-03-10 ENCOUNTER — Other Ambulatory Visit: Payer: Self-pay | Admitting: Internal Medicine

## 2013-05-30 ENCOUNTER — Encounter (HOSPITAL_COMMUNITY): Payer: Self-pay | Admitting: Emergency Medicine

## 2013-05-30 ENCOUNTER — Emergency Department (INDEPENDENT_AMBULATORY_CARE_PROVIDER_SITE_OTHER): Payer: Medicare PPO

## 2013-05-30 ENCOUNTER — Emergency Department (HOSPITAL_COMMUNITY)
Admission: EM | Admit: 2013-05-30 | Discharge: 2013-05-30 | Disposition: A | Discharge status: Home / Self Care | Payer: Medicare PPO | Source: Home / Self Care | Attending: Emergency Medicine | Admitting: Emergency Medicine

## 2013-05-30 DIAGNOSIS — J40 Bronchitis, not specified as acute or chronic: Secondary | ICD-10-CM

## 2013-05-30 MED ORDER — AZITHROMYCIN 250 MG PO TABS: ORAL_TABLET | ORAL | Status: DC

## 2013-05-30 MED ORDER — ALBUTEROL SULFATE (2.5 MG/3ML) 0.083% IN NEBU
INHALATION_SOLUTION | RESPIRATORY_TRACT | Status: AC
  Filled 2013-05-30: qty 6

## 2013-05-30 MED ORDER — PREDNISONE 20 MG PO TABS: 60.0000 mg | ORAL_TABLET | Freq: Once | ORAL | Status: DC

## 2013-05-30 MED ORDER — AEROCHAMBER PLUS FLO-VU LARGE MISC: 1.0000 | Freq: Once | Status: DC

## 2013-05-30 MED ORDER — ALBUTEROL SULFATE HFA 108 (90 BASE) MCG/ACT IN AERS: 1.0000 | INHALATION_SPRAY | Freq: Four times a day (QID) | RESPIRATORY_TRACT | Status: DC | PRN

## 2013-05-30 MED ORDER — IPRATROPIUM BROMIDE 0.02 % IN SOLN
RESPIRATORY_TRACT | Status: AC
  Filled 2013-05-30: qty 2.5

## 2013-05-30 MED ORDER — ALBUTEROL SULFATE (5 MG/ML) 0.5% IN NEBU: 5.0000 mg | INHALATION_SOLUTION | Freq: Once | RESPIRATORY_TRACT | Status: DC

## 2013-05-30 MED ORDER — METHYLPREDNISOLONE 4 MG PO KIT: PACK | ORAL | Status: DC

## 2013-05-30 MED ORDER — IPRATROPIUM BROMIDE 0.02 % IN SOLN: 0.5000 mg | Freq: Once | RESPIRATORY_TRACT | Status: DC

## 2013-05-30 NOTE — ED Notes (Signed)
C/o cold and cough symptoms for approx one and a half weeks

## 2013-05-30 NOTE — ED Provider Notes (Signed)
CSN: [id number]     Arrival date & time 05/30/13  7829 History   First MD Initiated Contact with Patient 05/30/13 1015     Chief Complaint  Patient presents with  . Cough   (Consider location/radiation/quality/duration/timing/severity/associated sxs/prior Treatment) HPI Comments: 68 year old male presents complaining of cough and some mild sore throat for about 10 days. He feels like he is starting to get better, but he is feeling a rattling or wheezing sensation in his chest when he takes breaths per day also has a dry throat and mild pain in his eyes. His girlfriend also is starting getting sick who is here to be seen as well. Denies chest pain, shortness of breath, NVD. Leg swelling, PND, or hemoptysis.  Patient is a 68 y.o. male presenting with cough.  Cough Associated symptoms: wheezing   Associated symptoms: no chest pain, no chills, no fever, no myalgias, no rash, no shortness of breath and no sore throat     Past Medical History  Diagnosis Date  . Hypertension   . ED (erectile dysfunction)   . Vitamin B 12 deficiency   . Hyperlipidemia   . BPH (benign prostatic hyperplasia)   . LBP (low back pain)   . PONV (postoperative nausea and vomiting)     with epidural inj  . Heart murmur     hx  . Arthritis    Past Surgical History  Procedure Laterality Date  . Knee arthroscopy  02    Right   Family History  Problem Relation Age of Onset  . Cirrhosis Mother   . Alcohol abuse Mother   . Cirrhosis Father   . Alcohol abuse Father   . Heart disease Sister 63    MI   History  Substance Use Topics  . Smoking status: Current Some Day Smoker -- 0.25 packs/day for 41 years    Types: Cigars, Cigarettes  . Smokeless tobacco: Not on file     Comment: 1-2/d  occ smoke   occ drink liquor  . Alcohol Use: Yes    Review of Systems  Constitutional: Negative for fever, chills and fatigue.  HENT: Negative for sore throat.        Dry throat  Eyes: Positive for pain. Negative for  visual disturbance.  Respiratory: Positive for cough, chest tightness and wheezing. Negative for shortness of breath.   Cardiovascular: Negative for chest pain, palpitations and leg swelling.  Gastrointestinal: Negative for nausea, vomiting, abdominal pain, diarrhea and constipation.  Genitourinary: Negative for dysuria, urgency, frequency and hematuria.  Musculoskeletal: Negative for arthralgias, myalgias, neck pain and neck stiffness.  Skin: Negative for rash.  Neurological: Negative for dizziness, weakness and light-headedness.    Allergies  Hydrochlorothiazide  Home Medications   Current Outpatient Rx  Name  Route  Sig  Dispense  Refill  . amLODipine-olmesartan (AZOR) 10-40 MG per tablet   Oral   Take 1 tablet by mouth daily.         . carvedilol (COREG) 25 MG tablet      TAKE 1 TABLET (25 MG TOTAL) BY MOUTH 2 (TWO) TIMES DAILY WITH A MEAL.   60 tablet   7   . albuterol (PROVENTIL HFA;VENTOLIN HFA) 108 (90 BASE) MCG/ACT inhaler   Inhalation   Inhale 1-2 puffs into the lungs every 6 (six) hours as needed for wheezing or shortness of breath.   1 Inhaler   0   . azithromycin (ZITHROMAX Z-PAK) 250 MG tablet      Use as directed  6 each   0   . EXPIRED: carvedilol (COREG) 25 MG tablet   Oral   Take 1 tablet (25 mg total) by mouth 2 (two) times daily with a meal.   180 tablet   3   . methylPREDNISolone (MEDROL DOSEPAK) 4 MG tablet      follow package directions   21 tablet   0     Dispense as written.   Marland Kitchen Spacer/Aero-Holding Chambers (AEROCHAMBER PLUS FLO-VU LARGE) MISC   Other   1 each by Other route once.   1 each   0    BP 114/62  Pulse 66  Temp(Src) 99 F (37.2 C) (Oral)  Resp 18  SpO2 100% Physical Exam  Nursing note and vitals reviewed. Constitutional: He is oriented to person, place, and time. He appears well-developed and well-nourished. No distress.  HENT:  Head: Normocephalic.  Neck: Normal range of motion. Neck supple.   Cardiovascular: Normal rate, regular rhythm and normal heart sounds.   Pulmonary/Chest: Effort normal. No respiratory distress. He has wheezes (diffuse). He has rhonchi (diffuse).  Lymphadenopathy:    He has no cervical adenopathy.  Neurological: He is alert and oriented to person, place, and time. Coordination normal.  Skin: Skin is warm and dry. No rash noted. He is not diaphoretic.  Psychiatric: He has a normal mood and affect. Judgment normal.    ED Course  Procedures (including critical care time) Labs Review Labs Reviewed - No data to display Imaging Review Dg Chest 2 View  05/30/2013   CLINICAL DATA:  Cough  EXAM: CHEST  2 VIEW  COMPARISON:  02/27/2012  FINDINGS: Bandlike lingula density compatible with atelectasis or scarring. Normal heart size. Stable prominent hilar vascularity. No edema pattern or pneumonia. No collapse or consolidation. Negative for effusion or pneumothorax. Trachea midline.  IMPRESSION: Lingula atelectasis versus scarring. Otherwise stable exam without acute process   Electronically Signed   By: Daryll Brod M.D.   On: 05/30/2013 11:20    Some improvement in the wheezing with nebulizer treatment, still with rhonchi.  MDM   1. Bronchitis    Treating with antibiotics due to prolonged course of symptoms and physical exam findings. He should followup when necessary  Meds ordered this encounter  Medications  . ipratropium (ATROVENT) nebulizer solution 0.5 mg    Sig:    And  . albuterol (PROVENTIL) (5 MG/ML) 0.5% nebulizer solution 5 mg    Sig:   . predniSONE (DELTASONE) tablet 60 mg    Sig:   . albuterol (PROVENTIL HFA;VENTOLIN HFA) 108 (90 BASE) MCG/ACT inhaler    Sig: Inhale 1-2 puffs into the lungs every 6 (six) hours as needed for wheezing or shortness of breath.    Dispense:  1 Inhaler    Refill:  0    Order Specific Question:  Supervising Provider    Answer:  Ihor Gully D [id number]  . Spacer/Aero-Holding Chambers (AEROCHAMBER PLUS FLO-VU  LARGE) MISC    Sig: 1 each by Other route once.    Dispense:  1 each    Refill:  0    Order Specific Question:  Supervising Provider    Answer:  Billy Fischer [phone]  . methylPREDNISolone (MEDROL DOSEPAK) 4 MG tablet    Sig: follow package directions    Dispense:  21 tablet    Refill:  0    Order Specific Question:  Supervising Provider    Answer:  Billy Fischer [phone]  . azithromycin (ZITHROMAX Z-PAK) 250 MG tablet  Sig: Use as directed    Dispense:  6 each    Refill:  0    Order Specific Question:  Supervising Provider    Answer:  Ihor Gully D [[id number]]     Liam Graham, PA-C 05/30/13 1149

## 2013-05-30 NOTE — ED Provider Notes (Signed)
Medical screening examination/treatment/procedure(s) were performed by non-physician practitioner and as supervising physician I was immediately available for consultation/collaboration.  Yovanni Frenette, M.D.  Evona Westra C Aidyn Kellis, MD 05/30/13 1442 

## 2013-05-30 NOTE — Discharge Instructions (Signed)

## 2013-09-09 ENCOUNTER — Ambulatory Visit (INDEPENDENT_AMBULATORY_CARE_PROVIDER_SITE_OTHER): Payer: Medicare PPO | Admitting: Internal Medicine

## 2013-09-09 ENCOUNTER — Other Ambulatory Visit (INDEPENDENT_AMBULATORY_CARE_PROVIDER_SITE_OTHER): Payer: Medicare PPO

## 2013-09-09 ENCOUNTER — Encounter: Payer: Self-pay | Admitting: Internal Medicine

## 2013-09-09 VITALS — BP 140/80 | HR 52 | Temp 97.5°F | Ht 72.25 in | Wt 221.2 lb

## 2013-09-09 DIAGNOSIS — I10 Essential (primary) hypertension: Secondary | ICD-10-CM

## 2013-09-09 DIAGNOSIS — M545 Low back pain, unspecified: Secondary | ICD-10-CM

## 2013-09-09 DIAGNOSIS — E538 Deficiency of other specified B group vitamins: Secondary | ICD-10-CM

## 2013-09-09 DIAGNOSIS — J45909 Unspecified asthma, uncomplicated: Secondary | ICD-10-CM

## 2013-09-09 DIAGNOSIS — Z0000 Encounter for general adult medical examination without abnormal findings: Secondary | ICD-10-CM

## 2013-09-09 DIAGNOSIS — R972 Elevated prostate specific antigen [PSA]: Secondary | ICD-10-CM

## 2013-09-09 DIAGNOSIS — Z23 Encounter for immunization: Secondary | ICD-10-CM

## 2013-09-09 DIAGNOSIS — J309 Allergic rhinitis, unspecified: Secondary | ICD-10-CM | POA: Insufficient documentation

## 2013-09-09 LAB — URINALYSIS
Bilirubin Urine: NEGATIVE
HGB URINE DIPSTICK: NEGATIVE
Ketones, ur: NEGATIVE
LEUKOCYTES UA: NEGATIVE
NITRITE: NEGATIVE
PH: 6 (ref 5.0–8.0)
Specific Gravity, Urine: 1.02 (ref 1.000–1.030)
Total Protein, Urine: NEGATIVE
UROBILINOGEN UA: 0.2 (ref 0.0–1.0)
Urine Glucose: NEGATIVE

## 2013-09-09 LAB — CBC WITH DIFFERENTIAL/PLATELET
BASOS ABS: 0 10*3/uL (ref 0.0–0.1)
Basophils Relative: 0.2 % (ref 0.0–3.0)
EOS ABS: 0.1 10*3/uL (ref 0.0–0.7)
Eosinophils Relative: 1.5 % (ref 0.0–5.0)
HCT: 47.8 % (ref 39.0–52.0)
Hemoglobin: 15.9 g/dL (ref 13.0–17.0)
LYMPHS PCT: 23.8 % (ref 12.0–46.0)
Lymphs Abs: 2.1 10*3/uL (ref 0.7–4.0)
MCHC: 33.4 g/dL (ref 30.0–36.0)
MCV: 87.3 fl (ref 78.0–100.0)
MONOS PCT: 9.9 % (ref 3.0–12.0)
Monocytes Absolute: 0.9 10*3/uL (ref 0.1–1.0)
NEUTROS ABS: 5.7 10*3/uL (ref 1.4–7.7)
NEUTROS PCT: 64.6 % (ref 43.0–77.0)
Platelets: 241 10*3/uL (ref 150.0–400.0)
RBC: 5.48 Mil/uL (ref 4.22–5.81)
RDW: 13.7 % (ref 11.5–14.6)
WBC: 8.8 10*3/uL (ref 4.5–10.5)

## 2013-09-09 LAB — HEPATIC FUNCTION PANEL
ALBUMIN: 4.1 g/dL (ref 3.5–5.2)
ALT: 24 U/L (ref 0–53)
AST: 22 U/L (ref 0–37)
Alkaline Phosphatase: 113 U/L (ref 39–117)
BILIRUBIN TOTAL: 0.8 mg/dL (ref 0.3–1.2)
Bilirubin, Direct: 0.1 mg/dL (ref 0.0–0.3)
TOTAL PROTEIN: 7.7 g/dL (ref 6.0–8.3)

## 2013-09-09 LAB — BASIC METABOLIC PANEL
BUN: 12 mg/dL (ref 6–23)
CALCIUM: 9.7 mg/dL (ref 8.4–10.5)
CO2: 26 mEq/L (ref 19–32)
CREATININE: 0.8 mg/dL (ref 0.4–1.5)
Chloride: 108 mEq/L (ref 96–112)
GFR: 126.06 mL/min (ref >60.00)
GLUCOSE: 100 mg/dL — AB (ref 70–99)
Potassium: 3.9 mEq/L (ref 3.5–5.1)
Sodium: 141 mEq/L (ref 135–145)

## 2013-09-09 LAB — LIPID PANEL
CHOLESTEROL: 180 mg/dL (ref 0–200)
HDL: 38.3 mg/dL — ABNORMAL LOW (ref >39.00)
LDL Cholesterol: 115 mg/dL — ABNORMAL HIGH (ref 0–99)
TRIGLYCERIDES: 136 mg/dL (ref 0.0–149.0)
Total CHOL/HDL Ratio: 5
VLDL: 27.2 mg/dL (ref 0.0–40.0)

## 2013-09-09 LAB — TSH: TSH: 2.29 u[IU]/mL (ref 0.35–5.50)

## 2013-09-09 LAB — VITAMIN B12: Vitamin B-12: 388 pg/mL (ref 211–911)

## 2013-09-09 MED ORDER — LORATADINE 10 MG PO TABS: 10.0000 mg | ORAL_TABLET | Freq: Every day | ORAL | Status: DC

## 2013-09-09 MED ORDER — VITAMIN D 1000 UNITS PO TABS: 1000.0000 [IU] | ORAL_TABLET | Freq: Every day | ORAL | Status: AC

## 2013-09-09 MED ORDER — FLUTICASONE FUROATE-VILANTEROL 100-25 MCG/INH IN AEPB: 1.0000 | INHALATION_SPRAY | Freq: Every day | RESPIRATORY_TRACT | Status: DC

## 2013-09-09 MED ORDER — AZELASTINE HCL 0.05 % OP SOLN: 1.0000 [drp] | Freq: Two times a day (BID) | OPHTHALMIC | Status: DC

## 2013-09-09 NOTE — Progress Notes (Signed)
Pre visit review using our clinic review tool, if applicable. No additional management support is needed unless otherwise documented below in the visit note. 

## 2013-09-09 NOTE — Assessment & Plan Note (Signed)
Here for medicare wellness/physical  Diet: heart healthy  Physical activity: not sedentary  Depression/mood screen: negative  Hearing: intact to whispered voice  Visual acuity: grossly normal, performs annual eye exam  ADLs: capable  Fall risk: none  Home safety: good  Cognitive evaluation: intact to orientation, naming, recall and repetition  EOL planning: adv directives, full code/ I agree  I have personally reviewed and have noted  1. The patient's medical and social history  2. Their use of alcohol, tobacco or illicit drugs  3. Their current medications and supplements  4. The patient's functional ability including ADL's, fall risks, home safety risks and hearing or visual impairment.  5. Diet and physical activities  6. Evidence for depression or mood disorders    Today patient counseled on age appropriate routine health concerns for screening and prevention, each reviewed and up to date or declined. Immunizations reviewed and up to date or declined. Labs ordered and reviewed. Risk factors for depression reviewed and negative. Hearing function and visual acuity are intact. ADLs screened and addressed as needed. Functional ability and level of safety reviewed and appropriate. Education, counseling and referrals performed based on assessed risks today. Patient provided with a copy of personalized plan for preventive services.  Prevnar Labd Colon up to date

## 2013-09-09 NOTE — Assessment & Plan Note (Signed)
4/15 post URI Breo

## 2013-09-09 NOTE — Progress Notes (Signed)
Subjective:   The patient is here for a wellness exam.    C/o wheezing post-bronchitic since Jan 2015 C/o allergies   Neck Pain  This is a chronic problem. The current episode started more than 1 year ago. The problem occurs constantly. The problem has been unchanged. The pain is associated with a fall. The pain is present in the right side. The quality of the pain is described as aching and burning. The pain is at a severity of 4/10. The pain is mild. The symptoms are aggravated by bending, position and twisting. The pain is worse during the night. Stiffness is present in the morning. The treatment provided mild relief.  Back Pain This is a chronic problem. The current episode started more than 1 year ago. The problem occurs constantly. The problem is unchanged. The pain is present in the gluteal and sacro-iliac. The quality of the pain is described as aching. The pain radiates to the right thigh. The pain is at a severity of 3/10. The pain is mild. The pain is worse during the night. The symptoms are aggravated by lying down and bending. Stiffness is present in the morning. The treatment provided mild relief.   F/u fall at work 07/23/11 -- fell hard on the ramp (slipped); fell on R side feet up in the air - he was talking funny following the fall and had a HA lasting al day, no LOC. He has been having HAs. He is seeing Dr Vertell Limber, getting PT through workman's comp.  LS spine surgery -- 10/18 Dr Vertell Limber  The patient presents for a follow-up of  chronic hypertension, chronic dyslipidemia, BPH. He stopped Azor for ?reason  Wt Readings from Last 3 Encounters:  09/09/13 221 lb 4 oz (100.358 kg)  03/04/13 225 lb (102.059 kg)  09/03/12 226 lb (102.513 kg)   BP Readings from Last 3 Encounters:  09/09/13 140/80  05/30/13 114/62  03/04/13 150/100   BP nl at home   Review of Systems  Constitutional: Negative for activity change and fatigue.  HENT: Negative for sneezing.   Eyes: Negative for  pain.  Respiratory: Negative for wheezing.   Musculoskeletal: Positive for back pain and neck pain. Negative for joint swelling.  Neurological: Negative for syncope.  Psychiatric/Behavioral: Negative for dysphoric mood.       Objective:   Physical Exam  Constitutional: He is oriented to person, place, and time. He appears well-developed.  HENT:  Mouth/Throat: Oropharynx is clear and moist.  Eyes: Conjunctivae are normal. Pupils are equal, round, and reactive to light.  Neck: Normal range of motion. No JVD present. No thyromegaly present.  Cardiovascular: Normal rate, regular rhythm, normal heart sounds and intact distal pulses.  Exam reveals no gallop and no friction rub.   No murmur heard. Pulmonary/Chest: Effort normal and breath sounds normal. No respiratory distress. He has no wheezes. He has no rales. He exhibits no tenderness.  Abdominal: Soft. Bowel sounds are normal. He exhibits no distension and no mass. There is no tenderness. There is no rebound and no guarding.  Musculoskeletal: Normal range of motion. He exhibits tenderness. He exhibits no edema.  R buttock, R hip, B cerv paraspinal muscles are less tender Decr LS and cerv spine ROM due to pain  Lymphadenopathy:    He has no cervical adenopathy.  Neurological: He is alert and oriented to person, place, and time. He has normal reflexes. No cranial nerve deficit. He exhibits normal muscle tone. Coordination normal.  Skin: Skin is warm  and dry. No rash noted.  Psychiatric: He has a normal mood and affect. His behavior is normal. Judgment and thought content normal.   Lab Results  Component Value Date   WBC 7.8 08/27/2012   HGB 14.8 08/27/2012   HCT 43.9 08/27/2012   PLT 223.0 08/27/2012   GLUCOSE 101* 08/27/2012   CHOL 167 08/27/2012   TRIG 69.0 08/27/2012   HDL 31.20* 08/27/2012   LDLDIRECT 134.0 02/08/2012   LDLCALC 122* 08/27/2012   ALT 20 08/27/2012   AST 16 08/27/2012   NA 141 08/27/2012   K 3.7 08/27/2012   CL 107 08/27/2012    CREATININE 0.7 08/27/2012   BUN 11 08/27/2012   CO2 25 08/27/2012   TSH 2.01 08/27/2012   PSA 0.74 10/31/2012   HGBA1C 5.6 05/08/2006       I personally provided Breo inhaler use teaching. After the teaching patient was able to demonstrate it's use effectively. All questions were answered    Assessment & Plan:

## 2013-09-09 NOTE — Assessment & Plan Note (Signed)
Continue with current prn prescription therapy as reflected on the Med list.  

## 2013-09-09 NOTE — Assessment & Plan Note (Signed)
Continue with current prescription therapy as reflected on the Med list.  

## 2013-09-09 NOTE — Assessment & Plan Note (Signed)
Free PSA 

## 2013-09-09 NOTE — Assessment & Plan Note (Signed)
[employer] Claritin

## 2013-10-15 ENCOUNTER — Encounter: Payer: Self-pay | Admitting: Internal Medicine

## 2013-10-15 ENCOUNTER — Ambulatory Visit (INDEPENDENT_AMBULATORY_CARE_PROVIDER_SITE_OTHER): Payer: Medicare PPO | Admitting: Internal Medicine

## 2013-10-15 VITALS — BP 122/80 | HR 57 | Temp 98.3°F | Ht 72.0 in | Wt 218.4 lb

## 2013-10-15 DIAGNOSIS — H109 Unspecified conjunctivitis: Secondary | ICD-10-CM | POA: Insufficient documentation

## 2013-10-15 DIAGNOSIS — I10 Essential (primary) hypertension: Secondary | ICD-10-CM

## 2013-10-15 DIAGNOSIS — J309 Allergic rhinitis, unspecified: Secondary | ICD-10-CM

## 2013-10-15 MED ORDER — TOBRAMYCIN 0.3 % OP SOLN: 1.0000 [drp] | Freq: Four times a day (QID) | OPHTHALMIC | Status: DC

## 2013-10-15 MED ORDER — METHYLPREDNISOLONE ACETATE 80 MG/ML IJ SUSP
80.0000 mg | Freq: Once | INTRAMUSCULAR | Status: AC
Start: 2013-10-15 — End: 2013-10-15
  Administered 2013-10-15: 80 mg via INTRAMUSCULAR

## 2013-10-15 MED ORDER — PREDNISONE 10 MG PO TABS: ORAL_TABLET | ORAL | Status: DC

## 2013-10-15 NOTE — Progress Notes (Signed)
Pre visit review using our clinic review tool, if applicable. No additional management support is needed unless otherwise documented below in the visit note. 

## 2013-10-15 NOTE — Patient Instructions (Signed)
You had the steroid shot today  Please take all new medication as prescribed - the antibiotic eye drops, and the prednisone  Please continue all other medications as before, and refills have been done if requested. Please have the pharmacy call with any other refills you may need.

## 2013-10-15 NOTE — Assessment & Plan Note (Signed)
Mild to mod, for depomedrol IM, predpac asd,  C/w seasonal flare,  to f/u any worsening symptoms or concerns

## 2013-10-15 NOTE — Progress Notes (Signed)
Subjective:    Patient ID: [name] [name], male    DOB: 08/19/1945, 68 y.o.   MRN: [id number]  HPI  Here with left > right upper/lower eyelid puffiness, itching, and conjunct erythema with left cloudy d/c as well, no vision change or fever, Does have several wks ongoing nasal allergy symptoms with clearish congestion, itch and sneezing, without fever, pain, ST, cough, swelling or wheezing. Pt denies chest pain, increased sob or doe, wheezing, orthopnea, PND, increased LE swelling, palpitations, dizziness or syncope.   Pt denies polydipsia, polyuria, Past Medical History  Diagnosis Date  . Hypertension   . ED (erectile dysfunction)   . Vitamin B 12 deficiency   . Hyperlipidemia   . BPH (benign prostatic hyperplasia)   . LBP (low back pain)   . PONV (postoperative nausea and vomiting)     with epidural inj  . Heart murmur     hx  . Arthritis    Past Surgical History  Procedure Laterality Date  . Knee arthroscopy  02    Right    reports that he has been smoking Cigars and Cigarettes.  He has a 10.25 pack-year smoking history. He does not have any smokeless tobacco history on file. He reports that he drinks alcohol. He reports that he does not use illicit drugs. family history includes Alcohol abuse in his father and mother; Cirrhosis in his father and mother; Heart disease (age of onset: 20) in his sister. Allergies  Allergen Reactions  . Hydrochlorothiazide     REACTION: HA, eyes hurt   Current Outpatient Prescriptions on File Prior to Visit  Medication Sig Dispense Refill  . albuterol (PROVENTIL HFA;VENTOLIN HFA) 108 (90 BASE) MCG/ACT inhaler Inhale 1-2 puffs into the lungs every 6 (six) hours as needed for wheezing or shortness of breath.  1 Inhaler  0  . amLODipine-olmesartan (AZOR) 10-40 MG per tablet Take 1 tablet by mouth daily.      Marland Kitchen azelastine (OPTIVAR) 0.05 % ophthalmic solution Place 1 drop into both eyes 2 (two) times daily.  6 mL  12  . carvedilol (COREG) 25 MG tablet  TAKE 1 TABLET (25 MG TOTAL) BY MOUTH 2 (TWO) TIMES DAILY WITH A MEAL.  60 tablet  7  . cholecalciferol (VITAMIN D) 1000 UNITS tablet Take 1 tablet (1,000 Units total) by mouth daily.  100 tablet  3  . Fluticasone Furoate-Vilanterol (BREO ELLIPTA) 100-25 MCG/INH AEPB Inhale 1 Act into the lungs daily.  1 each  5  . loratadine (CLARITIN) 10 MG tablet Take 1 tablet (10 mg total) by mouth daily.  100 tablet  3  . [DISCONTINUED] spironolactone (ALDACTONE) 50 MG tablet Take 50 mg by mouth daily. For blood pressure        No current facility-administered medications on file prior to visit.    Review of Systems  Constitutional: Negative for unusual diaphoresis or other sweats  HENT: Negative for ringing in ear Eyes: Negative for double vision or worsening visual disturbance.  Respiratory: Negative for choking and stridor.   Gastrointestinal: Negative for vomiting or other signifcant bowel change Genitourinary: Negative for hematuria or decreased urine volume.  Musculoskeletal: Negative for other MSK pain or swelling Skin: Negative for color change and worsening wound.  Neurological: Negative for tremors and numbness other than noted  Psychiatric/Behavioral: Negative for decreased concentration or agitation other than above       Objective:   Physical Exam BP 122/80  Pulse 57  Temp(Src) 98.3 F (36.8 C) (Oral)  Ht 6' (1.829 m)  Wt 218 lb 6 oz (99.054 kg)  BMI 29.61 kg/m2  SpO2 94%'VS noted, mild ill Constitutional: Pt appears well-developed, well-nourished.  HENT: Head: NCAT.  Right Ear: External ear normal.  Left Ear: External ear normal.  Bilat tm's with mild erythema.  Max sinus areas mild tender.  Pharynx with mild erythema, no exudate Eyes: . Pupils are equal, round, and reactive to light. Conjunctivae left > right conjucnt erythema, and EOM are normal; left eye upper and lower lid 1-2+ swelling Neck: Normal range of motion. Neck supple.  Cardiovascular: Normal rate and regular  rhythm.   Pulmonary/Chest: Effort normal and breath sounds normal.  Neurological: Pt is alert. Not confused , motor grossly intact Skin: Skin is warm. No rash Psychiatric: Pt behavior is normal. No agitation.     Assessment & Plan:

## 2013-10-15 NOTE — Assessment & Plan Note (Signed)
Mild to mod, for antibx course,  to f/u any worsening symptoms or concerns 

## 2013-10-15 NOTE — Assessment & Plan Note (Signed)
stable overall by history and exam, recent data reviewed with pt, and pt to continue medical treatment as before,  to f/u any worsening symptoms or concerns BP Readings from Last 3 Encounters:  10/15/13 122/80  09/09/13 140/80  05/30/13 114/62

## 2013-11-23 ENCOUNTER — Ambulatory Visit (INDEPENDENT_AMBULATORY_CARE_PROVIDER_SITE_OTHER): Payer: Commercial Managed Care - HMO | Admitting: Internal Medicine

## 2013-11-23 ENCOUNTER — Encounter: Payer: Self-pay | Admitting: Internal Medicine

## 2013-11-23 VITALS — BP 130/82 | HR 80 | Temp 98.5°F | Resp 16 | Wt 214.0 lb

## 2013-11-23 DIAGNOSIS — I10 Essential (primary) hypertension: Secondary | ICD-10-CM

## 2013-11-23 DIAGNOSIS — R21 Rash and other nonspecific skin eruption: Secondary | ICD-10-CM

## 2013-11-23 MED ORDER — TRIAMCINOLONE ACETONIDE 0.1 % EX CREA: 1.0000 "application " | TOPICAL_CREAM | Freq: Four times a day (QID) | CUTANEOUS | Status: DC

## 2013-11-23 NOTE — Progress Notes (Signed)
Pre visit review using our clinic review tool, if applicable. No additional management support is needed unless otherwise documented below in the visit note. 

## 2013-11-23 NOTE — Assessment & Plan Note (Signed)
?  insect bites -- 7/15 Benadryl Triamc cream

## 2013-11-23 NOTE — Assessment & Plan Note (Signed)
Continue with current prescription therapy as reflected on the Med list.  

## 2013-11-23 NOTE — Progress Notes (Signed)
Subjective:       C/o insect bites on B legs, mostly on L thigh  - Friday-Sat; itching a lot   Neck Pain  This is a chronic problem. The current episode started more than 1 year ago. The problem occurs constantly. The problem has been unchanged. The pain is associated with a fall. The pain is present in the right side. The quality of the pain is described as aching and burning. The pain is at a severity of 4/10. The pain is mild. The symptoms are aggravated by bending, position and twisting. The pain is worse during the night. Stiffness is present in the morning. The treatment provided mild relief.  Back Pain This is a chronic problem. The current episode started more than 1 year ago. The problem occurs constantly. The problem is unchanged. The pain is present in the gluteal and sacro-iliac. The quality of the pain is described as aching. The pain radiates to the right thigh. The pain is at a severity of 3/10. The pain is mild. The pain is worse during the night. The symptoms are aggravated by lying down and bending. Stiffness is present in the morning. The treatment provided mild relief.   F/u fall at work 07/23/11 -- fell hard on the ramp (slipped); fell on R side feet up in the air - he was talking funny following the fall and had a HA lasting al day, no LOC. He has been having HAs. He is seeing Dr Vertell Limber, getting PT through workman's comp.  LS spine surgery -- 10/18 Dr Vertell Limber  The patient presents for a follow-up of  chronic hypertension, chronic dyslipidemia, BPH. He stopped Azor for ?reason  Wt Readings from Last 3 Encounters:  11/23/13 214 lb (97.07 kg)  10/15/13 218 lb 6 oz (99.054 kg)  09/09/13 221 lb 4 oz (100.358 kg)   BP Readings from Last 3 Encounters:  11/23/13 130/82  10/15/13 122/80  09/09/13 140/80   BP nl at home   Review of Systems  Constitutional: Negative for activity change and fatigue.  HENT: Negative for sneezing.   Eyes: Negative for pain.  Respiratory:  Negative for wheezing.   Musculoskeletal: Positive for back pain and neck pain. Negative for joint swelling.  Neurological: Negative for syncope.  Psychiatric/Behavioral: Negative for dysphoric mood.       Objective:   Physical Exam  Constitutional: He is oriented to person, place, and time. He appears well-developed.  HENT:  Mouth/Throat: Oropharynx is clear and moist.  Eyes: Conjunctivae are normal. Pupils are equal, round, and reactive to light.  Neck: Normal range of motion. No JVD present. No thyromegaly present.  Cardiovascular: Normal rate, regular rhythm, normal heart sounds and intact distal pulses.  Exam reveals no gallop and no friction rub.   No murmur heard. Pulmonary/Chest: Effort normal and breath sounds normal. No respiratory distress. He has no wheezes. He has no rales. He exhibits no tenderness.  Abdominal: Soft. Bowel sounds are normal. He exhibits no distension and no mass. There is no tenderness. There is no rebound and no guarding.  Musculoskeletal: Normal range of motion. He exhibits tenderness. He exhibits no edema.  R buttock, R hip, B cerv paraspinal muscles are less tender Decr LS and cerv spine ROM due to pain  Lymphadenopathy:    He has no cervical adenopathy.  Neurological: He is alert and oriented to person, place, and time. He has normal reflexes. No cranial nerve deficit. He exhibits normal muscle tone. Coordination normal.  Skin:  Skin is warm and dry. No rash noted.  Psychiatric: He has a normal mood and affect. His behavior is normal. Judgment and thought content normal.  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

## 2013-11-24 ENCOUNTER — Telehealth: Payer: Self-pay | Admitting: Internal Medicine

## 2013-11-24 NOTE — Telephone Encounter (Signed)
Relevant patient education mailed to patient.  

## 2014-01-13 ENCOUNTER — Encounter: Payer: Self-pay | Admitting: Internal Medicine

## 2014-01-13 ENCOUNTER — Ambulatory Visit (INDEPENDENT_AMBULATORY_CARE_PROVIDER_SITE_OTHER): Payer: Commercial Managed Care - HMO | Admitting: Internal Medicine

## 2014-01-13 VITALS — BP 146/88 | HR 50 | Temp 98.0°F | Resp 18 | Ht 72.0 in | Wt 215.8 lb

## 2014-01-13 DIAGNOSIS — E538 Deficiency of other specified B group vitamins: Secondary | ICD-10-CM

## 2014-01-13 DIAGNOSIS — I10 Essential (primary) hypertension: Secondary | ICD-10-CM

## 2014-01-13 DIAGNOSIS — M545 Low back pain, unspecified: Secondary | ICD-10-CM

## 2014-01-13 DIAGNOSIS — Z23 Encounter for immunization: Secondary | ICD-10-CM

## 2014-01-13 NOTE — Progress Notes (Signed)
Pre visit review using our clinic review tool, if applicable. No additional management support is needed unless otherwise documented below in the visit note. 

## 2014-01-13 NOTE — Assessment & Plan Note (Signed)
Continue with current prescription therapy as reflected on the Med list.  

## 2014-01-13 NOTE — Assessment & Plan Note (Signed)
Risks associated with treatment noncompliance were discussed. Compliance was encouraged. Re-start Coreg

## 2014-01-13 NOTE — Assessment & Plan Note (Signed)
Doing ok ROM exercises

## 2014-01-13 NOTE — Progress Notes (Signed)
Subjective:          Neck Pain  This is a chronic problem. The current episode started more than 1 year ago. The problem occurs constantly. The problem has been unchanged. The pain is associated with a fall. The pain is present in the right side. The quality of the pain is described as aching and burning. The pain is at a severity of 4/10. The pain is mild. The symptoms are aggravated by bending, position and twisting. The pain is worse during the night. Stiffness is present in the morning. The treatment provided mild relief.  Back Pain This is a chronic problem. The current episode started more than 1 year ago. The problem occurs constantly. The problem is unchanged. The pain is present in the gluteal and sacro-iliac. The quality of the pain is described as aching. The pain radiates to the right thigh. The pain is at a severity of 3/10. The pain is mild. The pain is worse during the night. The symptoms are aggravated by lying down and bending. Stiffness is present in the morning. The treatment provided mild relief.   F/u fall at work 07/23/11 -- fell hard on the ramp (slipped); fell on R side feet up in the air - he was talking funny following the fall and had a HA lasting al day, no LOC. He has been having HAs. He is seeing Dr Vertell Limber, getting PT through workman's comp.  LS spine surgery -- 10/18 Dr Vertell Limber  The patient presents for a follow-up of  chronic hypertension, chronic dyslipidemia, BPH. He stopped Azor for ?reason  Wt Readings from Last 3 Encounters:  01/13/14 215 lb 12.8 oz (97.886 kg)  11/23/13 214 lb (97.07 kg)  10/15/13 218 lb 6 oz (99.054 kg)   BP Readings from Last 3 Encounters:  01/13/14 146/88  11/23/13 130/82  10/15/13 122/80   BP nl at home   Review of Systems  Constitutional: Negative for activity change and fatigue.  HENT: Negative for sneezing.   Eyes: Negative for pain.  Respiratory: Negative for wheezing.   Musculoskeletal: Positive for back pain and neck  pain. Negative for joint swelling.  Neurological: Negative for syncope.  Psychiatric/Behavioral: Negative for dysphoric mood.       Objective:   Physical Exam  Constitutional: He is oriented to person, place, and time. He appears well-developed.  HENT:  Mouth/Throat: Oropharynx is clear and moist.  Eyes: Conjunctivae are normal. Pupils are equal, round, and reactive to light.  Neck: Normal range of motion. No JVD present. No thyromegaly present.  Cardiovascular: Normal rate, regular rhythm, normal heart sounds and intact distal pulses.  Exam reveals no gallop and no friction rub.   No murmur heard. Pulmonary/Chest: Effort normal and breath sounds normal. No respiratory distress. He has no wheezes. He has no rales. He exhibits no tenderness.  Abdominal: Soft. Bowel sounds are normal. He exhibits no distension and no mass. There is no tenderness. There is no rebound and no guarding.  Musculoskeletal: Normal range of motion. He exhibits tenderness. He exhibits no edema.  R buttock, R hip, B cerv paraspinal muscles are less tender Decr LS and cerv spine ROM due to pain  Lymphadenopathy:    He has no cervical adenopathy.  Neurological: He is alert and oriented to person, place, and time. He has normal reflexes. No cranial nerve deficit. He exhibits normal muscle tone. Coordination normal.  Skin: Skin is warm and dry. No rash noted.  Psychiatric: He has a normal mood  and affect. His behavior is normal. Judgment and thought content normal.    Lab Results  Component Value Date   WBC 8.8 09/09/2013   HGB 15.9 09/09/2013   HCT 47.8 09/09/2013   PLT 241.0 09/09/2013   GLUCOSE 100* 09/09/2013   CHOL 180 09/09/2013   TRIG 136.0 09/09/2013   HDL 38.30* 09/09/2013   LDLDIRECT 134.0 02/08/2012   LDLCALC 115* 09/09/2013   ALT 24 09/09/2013   AST 22 09/09/2013   NA 141 09/09/2013   K 3.9 09/09/2013   CL 108 09/09/2013   CREATININE 0.8 09/09/2013   BUN 12 09/09/2013   CO2 26 09/09/2013   TSH 2.29 09/09/2013    PSA 0.74 10/31/2012   HGBA1C 5.6 05/08/2006        Assessment & Plan:

## 2014-03-30 ENCOUNTER — Other Ambulatory Visit: Payer: Self-pay | Admitting: Internal Medicine

## 2014-04-08 ENCOUNTER — Other Ambulatory Visit: Payer: Self-pay | Admitting: Internal Medicine

## 2014-09-15 ENCOUNTER — Encounter: Payer: Self-pay | Admitting: Internal Medicine

## 2014-09-15 ENCOUNTER — Ambulatory Visit (INDEPENDENT_AMBULATORY_CARE_PROVIDER_SITE_OTHER): Payer: Commercial Managed Care - HMO | Admitting: Internal Medicine

## 2014-09-15 VITALS — BP 138/90 | HR 54 | Ht 72.0 in | Wt 223.0 lb

## 2014-09-15 DIAGNOSIS — N410 Acute prostatitis: Secondary | ICD-10-CM

## 2014-09-15 DIAGNOSIS — I10 Essential (primary) hypertension: Secondary | ICD-10-CM

## 2014-09-15 DIAGNOSIS — N419 Inflammatory disease of prostate, unspecified: Secondary | ICD-10-CM | POA: Insufficient documentation

## 2014-09-15 DIAGNOSIS — Z86010 Personal history of colonic polyps: Secondary | ICD-10-CM | POA: Insufficient documentation

## 2014-09-15 DIAGNOSIS — Z0000 Encounter for general adult medical examination without abnormal findings: Secondary | ICD-10-CM | POA: Diagnosis not present

## 2014-09-15 DIAGNOSIS — E538 Deficiency of other specified B group vitamins: Secondary | ICD-10-CM

## 2014-09-15 MED ORDER — CIPROFLOXACIN HCL 500 MG PO TABS: 500.0000 mg | ORAL_TABLET | Freq: Two times a day (BID) | ORAL | Status: DC

## 2014-09-15 MED ORDER — CARVEDILOL 25 MG PO TABS: ORAL_TABLET | ORAL | Status: DC

## 2014-09-15 MED ORDER — AMLODIPINE-OLMESARTAN 10-40 MG PO TABS: 1.0000 | ORAL_TABLET | Freq: Every day | ORAL | Status: DC

## 2014-09-15 NOTE — Progress Notes (Signed)
Pre visit review using our clinic review tool, if applicable. No additional management support is needed unless otherwise documented below in the visit note. 

## 2014-09-15 NOTE — Assessment & Plan Note (Signed)
On Coreg BP Readings from Last 3 Encounters:  09/15/14 138/90  01/13/14 146/88  11/23/13 130/82

## 2014-09-15 NOTE — Assessment & Plan Note (Signed)
Cipro x 10 d 

## 2014-09-15 NOTE — Assessment & Plan Note (Signed)
Colonoscopy is due in 2017 - Dr Henrene Pastor

## 2014-09-15 NOTE — Patient Instructions (Signed)
Preventive Care for Adults A healthy lifestyle and preventive care can promote health and wellness. Preventive health guidelines for men include the following key practices:  A routine yearly physical is a good way to check with your health care provider about your health and preventative screening. It is a chance to share any concerns and updates on your health and to receive a thorough exam.  Visit your dentist for a routine exam and preventative care every 6 months. Brush your teeth twice a day and floss once a day. Good oral hygiene prevents tooth decay and gum disease.  The frequency of eye exams is based on your age, health, family medical history, use of contact lenses, and other factors. Follow your health care provider's recommendations for frequency of eye exams.  Eat a healthy diet. Foods such as vegetables, fruits, whole grains, low-fat dairy products, and lean protein foods contain the nutrients you need without too many calories. Decrease your intake of foods high in solid fats, added sugars, and salt. Eat the right amount of calories for you.Get information about a proper diet from your health care provider, if necessary.  Regular physical exercise is one of the most important things you can do for your health. Most adults should get at least 150 minutes of moderate-intensity exercise (any activity that increases your heart rate and causes you to sweat) each week. In addition, most adults need muscle-strengthening exercises on 2 or more days a week.  Maintain a healthy weight. The body mass index (BMI) is a screening tool to identify possible weight problems. It provides an estimate of body fat based on height and weight. Your health care provider can find your BMI and can help you achieve or maintain a healthy weight.For adults 20 years and older:  A BMI below 18.5 is considered underweight.  A BMI of 18.5 to 24.9 is normal.  A BMI of 25 to 29.9 is considered overweight.  A BMI  of 30 and above is considered obese.  Maintain normal blood lipids and cholesterol levels by exercising and minimizing your intake of saturated fat. Eat a balanced diet with plenty of fruit and vegetables. Blood tests for lipids and cholesterol should begin at age 50 and be repeated every 5 years. If your lipid or cholesterol levels are high, you are over 50, or you are at high risk for heart disease, you may need your cholesterol levels checked more frequently.Ongoing high lipid and cholesterol levels should be treated with medicines if diet and exercise are not working.  If you smoke, find out from your health care provider how to quit. If you do not use tobacco, do not start.  Lung cancer screening is recommended for adults aged 73-80 years who are at high risk for developing lung cancer because of a history of smoking. A yearly low-dose CT scan of the lungs is recommended for people who have at least a 30-pack-year history of smoking and are a current smoker or have quit within the past 15 years. A pack year of smoking is smoking an average of 1 pack of cigarettes a day for 1 year (for example: 1 pack a day for 30 years or 2 packs a day for 15 years). Yearly screening should continue until the smoker has stopped smoking for at least 15 years. Yearly screening should be stopped for people who develop a health problem that would prevent them from having lung cancer treatment.  If you choose to drink alcohol, do not have more than  2 drinks per day. One drink is considered to be 12 ounces (355 mL) of beer, 5 ounces (148 mL) of wine, or 1.5 ounces (44 mL) of liquor.  Avoid use of street drugs. Do not share needles with anyone. Ask for help if you need support or instructions about stopping the use of drugs.  High blood pressure causes heart disease and increases the risk of stroke. Your blood pressure should be checked at least every 1-2 years. Ongoing high blood pressure should be treated with  medicines, if weight loss and exercise are not effective.  If you are 45-79 years old, ask your health care provider if you should take aspirin to prevent heart disease.  Diabetes screening involves taking a blood sample to check your fasting blood sugar level. This should be done once every 3 years, after age 45, if you are within normal weight and without risk factors for diabetes. Testing should be considered at a younger age or be carried out more frequently if you are overweight and have at least 1 risk factor for diabetes.  Colorectal cancer can be detected and often prevented. Most routine colorectal cancer screening begins at the age of 50 and continues through age 75. However, your health care provider may recommend screening at an earlier age if you have risk factors for colon cancer. On a yearly basis, your health care provider may provide home test kits to check for hidden blood in the stool. Use of a small camera at the end of a tube to directly examine the colon (sigmoidoscopy or colonoscopy) can detect the earliest forms of colorectal cancer. Talk to your health care provider about this at age 50, when routine screening begins. Direct exam of the colon should be repeated every 5-10 years through age 75, unless early forms of precancerous polyps or small growths are found.  People who are at an increased risk for hepatitis B should be screened for this virus. You are considered at high risk for hepatitis B if:  You were born in a country where hepatitis B occurs often. Talk with your health care provider about which countries are considered high risk.  Your parents were born in a high-risk country and you have not received a shot to protect against hepatitis B (hepatitis B vaccine).  You have HIV or AIDS.  You use needles to inject street drugs.  You live with, or have sex with, someone who has hepatitis B.  You are a man who has sex with other men (MSM).  You get hemodialysis  treatment.  You take certain medicines for conditions such as cancer, organ transplantation, and autoimmune conditions.  Hepatitis C blood testing is recommended for all people born from 1945 through 1965 and any individual with known risks for hepatitis C.  Practice safe sex. Use condoms and avoid high-risk sexual practices to reduce the spread of sexually transmitted infections (STIs). STIs include gonorrhea, chlamydia, syphilis, trichomonas, herpes, HPV, and human immunodeficiency virus (HIV). Herpes, HIV, and HPV are viral illnesses that have no cure. They can result in disability, cancer, and death.  If you are at risk of being infected with HIV, it is recommended that you take a prescription medicine daily to prevent HIV infection. This is called preexposure prophylaxis (PrEP). You are considered at risk if:  You are a man who has sex with other men (MSM) and have other risk factors.  You are a heterosexual man, are sexually active, and are at increased risk for HIV infection.    You take drugs by injection.  You are sexually active with a partner who has HIV.  Talk with your health care provider about whether you are at high risk of being infected with HIV. If you choose to begin PrEP, you should first be tested for HIV. You should then be tested every 3 months for as long as you are taking PrEP.  A one-time screening for abdominal aortic aneurysm (AAA) and surgical repair of large AAAs by ultrasound are recommended for men ages 32 to 67 years who are current or former smokers.  Healthy men should no longer receive prostate-specific antigen (PSA) blood tests as part of routine cancer screening. Talk with your health care provider about prostate cancer screening.  Testicular cancer screening is not recommended for adult males who have no symptoms. Screening includes self-exam, a health care provider exam, and other screening tests. Consult with your health care provider about any symptoms  you have or any concerns you have about testicular cancer.  Use sunscreen. Apply sunscreen liberally and repeatedly throughout the day. You should seek shade when your shadow is shorter than you. Protect yourself by wearing long sleeves, pants, a wide-brimmed hat, and sunglasses year round, whenever you are outdoors.  Once a month, do a whole-body skin exam, using a mirror to look at the skin on your back. Tell your health care provider about new moles, moles that have irregular borders, moles that are larger than a pencil eraser, or moles that have changed in shape or color.  Stay current with required vaccines (immunizations).  Influenza vaccine. All adults should be immunized every year.  Tetanus, diphtheria, and acellular pertussis (Td, Tdap) vaccine. An adult who has not previously received Tdap or who does not know his vaccine status should receive 1 dose of Tdap. This initial dose should be followed by tetanus and diphtheria toxoids (Td) booster doses every 10 years. Adults with an unknown or incomplete history of completing a 3-dose immunization series with Td-containing vaccines should begin or complete a primary immunization series including a Tdap dose. Adults should receive a Td booster every 10 years.  Varicella vaccine. An adult without evidence of immunity to varicella should receive 2 doses or a second dose if he has previously received 1 dose.  Human papillomavirus (HPV) vaccine. Males aged 68-21 years who have not received the vaccine previously should receive the 3-dose series. Males aged 22-26 years may be immunized. Immunization is recommended through the age of 6 years for any male who has sex with males and did not get any or all doses earlier. Immunization is recommended for any person with an immunocompromised condition through the age of 49 years if he did not get any or all doses earlier. During the 3-dose series, the second dose should be obtained 4-8 weeks after the first  dose. The third dose should be obtained 24 weeks after the first dose and 16 weeks after the second dose.  Zoster vaccine. One dose is recommended for adults aged 50 years or older unless certain conditions are present.  Measles, mumps, and rubella (MMR) vaccine. Adults born before [birth year] generally are considered immune to measles and mumps. Adults born in [birth year] or later should have 1 or more doses of MMR vaccine unless there is a contraindication to the vaccine or there is laboratory evidence of immunity to each of the three diseases. A routine second dose of MMR vaccine should be obtained at least 28 days after the first dose for students attending postsecondary  schools, health care workers, or international travelers. People who received inactivated measles vaccine or an unknown type of measles vaccine during 1963-1967 should receive 2 doses of MMR vaccine. People who received inactivated mumps vaccine or an unknown type of mumps vaccine before 1979 and are at high risk for mumps infection should consider immunization with 2 doses of MMR vaccine. Unvaccinated health care workers born before 1957 who lack laboratory evidence of measles, mumps, or rubella immunity or laboratory confirmation of disease should consider measles and mumps immunization with 2 doses of MMR vaccine or rubella immunization with 1 dose of MMR vaccine.  Pneumococcal 13-valent conjugate (PCV13) vaccine. When indicated, a person who is uncertain of his immunization history and has no record of immunization should receive the PCV13 vaccine. An adult aged 19 years or older who has certain medical conditions and has not been previously immunized should receive 1 dose of PCV13 vaccine. This PCV13 should be followed with a dose of pneumococcal polysaccharide (PPSV23) vaccine. The PPSV23 vaccine dose should be obtained at least 8 weeks after the dose of PCV13 vaccine. An adult aged 19 years or older who has certain medical conditions and  previously received 1 or more doses of PPSV23 vaccine should receive 1 dose of PCV13. The PCV13 vaccine dose should be obtained 1 or more years after the last PPSV23 vaccine dose.  Pneumococcal polysaccharide (PPSV23) vaccine. When PCV13 is also indicated, PCV13 should be obtained first. All adults aged 65 years and older should be immunized. An adult younger than age 65 years who has certain medical conditions should be immunized. Any person who resides in a nursing home or long-term care facility should be immunized. An adult smoker should be immunized. People with an immunocompromised condition and certain other conditions should receive both PCV13 and PPSV23 vaccines. People with human immunodeficiency virus (HIV) infection should be immunized as soon as possible after diagnosis. Immunization during chemotherapy or radiation therapy should be avoided. Routine use of PPSV23 vaccine is not recommended for American Indians, [place] Natives, or people younger than 65 years unless there are medical conditions that require PPSV23 vaccine. When indicated, people who have unknown immunization and have no record of immunization should receive PPSV23 vaccine. One-time revaccination 5 years after the first dose of PPSV23 is recommended for people aged 19-64 years who have chronic kidney failure, nephrotic syndrome, asplenia, or immunocompromised conditions. People who received 1-2 doses of PPSV23 before age 65 years should receive another dose of PPSV23 vaccine at age 65 years or later if at least 5 years have passed since the previous dose. Doses of PPSV23 are not needed for people immunized with PPSV23 at or after age 65 years.  Meningococcal vaccine. Adults with asplenia or persistent complement component deficiencies should receive 2 doses of quadrivalent meningococcal conjugate (MenACWY-D) vaccine. The doses should be obtained at least 2 months apart. Microbiologists working with certain meningococcal bacteria,  military recruits, people at risk during an outbreak, and people who travel to or live in countries with a high rate of meningitis should be immunized. A first-year college student up through age 21 years who is living in a residence hall should receive a dose if he did not receive a dose on or after his 16th birthday. Adults who have certain high-risk conditions should receive one or more doses of vaccine.  Hepatitis A vaccine. Adults who wish to be protected from this disease, have certain high-risk conditions, work with hepatitis A-infected animals, work in hepatitis A research labs, or   travel to or work in countries with a high rate of hepatitis A should be immunized. Adults who were previously unvaccinated and who anticipate close contact with an international adoptee during the first 60 days after arrival in the [place] from a country with a high rate of hepatitis A should be immunized.  Hepatitis B vaccine. Adults should be immunized if they wish to be protected from this disease, have certain high-risk conditions, may be exposed to blood or other infectious body fluids, are household contacts or sex partners of hepatitis B positive people, are clients or workers in certain care facilities, or travel to or work in countries with a high rate of hepatitis B.  Haemophilus influenzae type b (Hib) vaccine. A previously unvaccinated person with asplenia or sickle cell disease or having a scheduled splenectomy should receive 1 dose of Hib vaccine. Regardless of previous immunization, a recipient of a hematopoietic stem cell transplant should receive a 3-dose series 6-12 months after his successful transplant. Hib vaccine is not recommended for adults with HIV infection. Preventive Service / Frequency Ages 52 to 17  Blood pressure check.** / Every 1 to 2 years.  Lipid and cholesterol check.** / Every 5 years beginning at age 69.  Hepatitis C blood test.** / For any individual with known risks for  hepatitis C.  Skin self-exam. / Monthly.  Influenza vaccine. / Every year.  Tetanus, diphtheria, and acellular pertussis (Tdap, Td) vaccine.** / Consult your health care provider. 1 dose of Td every 10 years.  Varicella vaccine.** / Consult your health care provider.  HPV vaccine. / 3 doses over 6 months, if 72 or younger.  Measles, mumps, rubella (MMR) vaccine.** / You need at least 1 dose of MMR if you were born in 1957 or later. You may also need a second dose.  Pneumococcal 13-valent conjugate (PCV13) vaccine.** / Consult your health care provider.  Pneumococcal polysaccharide (PPSV23) vaccine.** / 1 to 2 doses if you smoke cigarettes or if you have certain conditions.  Meningococcal vaccine.** / 1 dose if you are age 35 to 60 years and a Market researcher living in a residence hall, or have one of several medical conditions. You may also need additional booster doses.  Hepatitis A vaccine.** / Consult your health care provider.  Hepatitis B vaccine.** / Consult your health care provider.  Haemophilus influenzae type b (Hib) vaccine.** / Consult your health care provider. Ages 35 to 8  Blood pressure check.** / Every 1 to 2 years.  Lipid and cholesterol check.** / Every 5 years beginning at age 57.  Lung cancer screening. / Every year if you are aged 44-80 years and have a 30-pack-year history of smoking and currently smoke or have quit within the past 15 years. Yearly screening is stopped once you have quit smoking for at least 15 years or develop a health problem that would prevent you from having lung cancer treatment.  Fecal occult blood test (FOBT) of stool. / Every year beginning at age 55 and continuing until age 73. You may not have to do this test if you get a colonoscopy every 10 years.  Flexible sigmoidoscopy** or colonoscopy.** / Every 5 years for a flexible sigmoidoscopy or every 10 years for a colonoscopy beginning at age 28 and continuing until age  1.  Hepatitis C blood test.** / For all people born from [birth year] through 1965 and any individual with known risks for hepatitis C.  Skin self-exam. / Monthly.  Influenza vaccine. / Every  year.  Tetanus, diphtheria, and acellular pertussis (Tdap/Td) vaccine.** / Consult your health care provider. 1 dose of Td every 10 years.  Varicella vaccine.** / Consult your health care provider.  Zoster vaccine.** / 1 dose for adults aged 53 years or older.  Measles, mumps, rubella (MMR) vaccine.** / You need at least 1 dose of MMR if you were born in 1957 or later. You may also need a second dose.  Pneumococcal 13-valent conjugate (PCV13) vaccine.** / Consult your health care provider.  Pneumococcal polysaccharide (PPSV23) vaccine.** / 1 to 2 doses if you smoke cigarettes or if you have certain conditions.  Meningococcal vaccine.** / Consult your health care provider.  Hepatitis A vaccine.** / Consult your health care provider.  Hepatitis B vaccine.** / Consult your health care provider.  Haemophilus influenzae type b (Hib) vaccine.** / Consult your health care provider. Ages 77 and over  Blood pressure check.** / Every 1 to 2 years.  Lipid and cholesterol check.**/ Every 5 years beginning at age 85.  Lung cancer screening. / Every year if you are aged 55-80 years and have a 30-pack-year history of smoking and currently smoke or have quit within the past 15 years. Yearly screening is stopped once you have quit smoking for at least 15 years or develop a health problem that would prevent you from having lung cancer treatment.  Fecal occult blood test (FOBT) of stool. / Every year beginning at age 33 and continuing until age 11. You may not have to do this test if you get a colonoscopy every 10 years.  Flexible sigmoidoscopy** or colonoscopy.** / Every 5 years for a flexible sigmoidoscopy or every 10 years for a colonoscopy beginning at age 28 and continuing until age 73.  Hepatitis C blood  test.** / For all people born from [birth year] through 1965 and any individual with known risks for hepatitis C.  Abdominal aortic aneurysm (AAA) screening.** / A one-time screening for ages 50 to 27 years who are current or former smokers.  Skin self-exam. / Monthly.  Influenza vaccine. / Every year.  Tetanus, diphtheria, and acellular pertussis (Tdap/Td) vaccine.** / 1 dose of Td every 10 years.  Varicella vaccine.** / Consult your health care provider.  Zoster vaccine.** / 1 dose for adults aged 34 years or older.  Pneumococcal 13-valent conjugate (PCV13) vaccine.** / Consult your health care provider.  Pneumococcal polysaccharide (PPSV23) vaccine.** / 1 dose for all adults aged 63 years and older.  Meningococcal vaccine.** / Consult your health care provider.  Hepatitis A vaccine.** / Consult your health care provider.  Hepatitis B vaccine.** / Consult your health care provider.  Haemophilus influenzae type b (Hib) vaccine.** / Consult your health care provider. **Family history and personal history of risk and conditions may change your health care provider's recommendations. Document Released: 07/03/2001 Document Revised: 05/12/2013 Document Reviewed: 10/02/2010 [facility] Patient Information 2015 [place], [place]. This information is not intended to replace advice given to you by your health care provider. Make sure you discuss any questions you have with your health care provider.

## 2014-09-15 NOTE — Assessment & Plan Note (Deleted)

## 2014-09-15 NOTE — Progress Notes (Signed)
   Subjective:   The patient is here for a wellness exam.    C/o pain in the "rear" - ?prostate issue   HPI F/u fall at work 07/23/11 -- fell hard on the ramp (slipped); fell on R side feet up in the air - he was talking funny following the fall and had a HA lasting al day, no LOC. He has been having HAs. He is seeing Dr Vertell Limber, getting PT through workman's comp.  LS spine surgery -- 10/18 Dr Vertell Limber  The patient presents for a follow-up of  chronic hypertension, chronic dyslipidemia, BPH. He stopped Azor for ?reason  Wt Readings from Last 3 Encounters:  09/15/14 223 lb (101.152 kg)  01/13/14 215 lb 12.8 oz (97.886 kg)  11/23/13 214 lb (97.07 kg)   BP Readings from Last 3 Encounters:  09/15/14 138/90  01/13/14 146/88  11/23/13 130/82   BP nl at home   Review of Systems  Constitutional: Negative for activity change and fatigue.  HENT: Negative for sneezing.   Eyes: Negative for pain.  Respiratory: Negative for wheezing.   Musculoskeletal: Negative for joint swelling.  Neurological: Negative for syncope.  Psychiatric/Behavioral: Negative for dysphoric mood.       Objective:   Physical Exam  Constitutional: He is oriented to person, place, and time. He appears well-developed.  HENT:  Mouth/Throat: Oropharynx is clear and moist.  Eyes: Conjunctivae are normal. Pupils are equal, round, and reactive to light.  Neck: Normal range of motion. No JVD present. No thyromegaly present.  Cardiovascular: Normal rate, regular rhythm, normal heart sounds and intact distal pulses.  Exam reveals no gallop and no friction rub.   No murmur heard. Pulmonary/Chest: Effort normal and breath sounds normal. No respiratory distress. He has no wheezes. He has no rales. He exhibits no tenderness.  Abdominal: Soft. Bowel sounds are normal. He exhibits no distension and no mass. There is no tenderness. There is no rebound and no guarding.  Musculoskeletal: Normal range of motion. He exhibits  tenderness. He exhibits no edema.  R buttock, R hip, B cerv paraspinal muscles are less tender Decr LS and cerv spine ROM due to pain  Lymphadenopathy:    He has no cervical adenopathy.  Neurological: He is alert and oriented to person, place, and time. He has normal reflexes. No cranial nerve deficit. He exhibits normal muscle tone. Coordination normal.  Skin: Skin is warm and dry. No rash noted.  Psychiatric: He has a normal mood and affect. His behavior is normal. Judgment and thought content normal.  Prostate 1-2+, sensitive G (-) stool Lab Results  Component Value Date   WBC 8.8 09/09/2013   HGB 15.9 09/09/2013   HCT 47.8 09/09/2013   PLT 241.0 09/09/2013   GLUCOSE 100* 09/09/2013   CHOL 180 09/09/2013   TRIG 136.0 09/09/2013   HDL 38.30* 09/09/2013   LDLDIRECT 134.0 02/08/2012   LDLCALC 115* 09/09/2013   ALT 24 09/09/2013   AST 22 09/09/2013   NA 141 09/09/2013   K 3.9 09/09/2013   CL 108 09/09/2013   CREATININE 0.8 09/09/2013   BUN 12 09/09/2013   CO2 26 09/09/2013   TSH 2.29 09/09/2013   PSA 0.74 10/31/2012   HGBA1C 5.6 05/08/2006           Assessment & Plan:

## 2014-09-15 NOTE — Assessment & Plan Note (Addendum)
Here for medicare wellness/physical  Diet: heart healthy  Physical activity: not sedentary  Depression/mood screen: negative  Hearing: intact to whispered voice  Visual acuity: grossly normal, performs annual eye exam  ADLs: capable  Fall risk: none  Home safety: good  Cognitive evaluation: intact to orientation, naming, recall and repetition  EOL planning: adv directives, full code/ I agree  I have personally reviewed and have noted  1. The patient's medical, surgical and social history  2. Their use of alcohol, tobacco or illicit drugs  3. Their current medications and supplements  4. The patient's functional ability including ADL's, fall risks, home safety risks and hearing or visual impairment.  5. Diet and physical activities  6. Evidence for depression or mood disorders 7. The roster of all physicians providing medical care to patient - is listed in the Snapshot section of the chart and reviewed today.    Today patient counseled on age appropriate routine health concerns for screening and prevention, each reviewed and up to date or declined. Immunizations reviewed and up to date or declined. Labs ordered and reviewed. Risk factors for depression reviewed and negative. Hearing function and visual acuity are intact. ADLs screened and addressed as needed. Functional ability and level of safety reviewed and appropriate. Education, counseling and referrals performed based on assessed risks today. Patient provided with a copy of personalized plan for preventive services.   Colonoscopy is due in 2017 - Dr Henrene Pastor

## 2014-09-15 NOTE — Assessment & Plan Note (Signed)
On B12 

## 2014-10-13 ENCOUNTER — Other Ambulatory Visit (INDEPENDENT_AMBULATORY_CARE_PROVIDER_SITE_OTHER): Payer: Commercial Managed Care - HMO

## 2014-10-13 DIAGNOSIS — Z860100 Personal history of colon polyps, unspecified: Secondary | ICD-10-CM

## 2014-10-13 DIAGNOSIS — E538 Deficiency of other specified B group vitamins: Secondary | ICD-10-CM | POA: Diagnosis not present

## 2014-10-13 DIAGNOSIS — Z0000 Encounter for general adult medical examination without abnormal findings: Secondary | ICD-10-CM | POA: Diagnosis not present

## 2014-10-13 DIAGNOSIS — I10 Essential (primary) hypertension: Secondary | ICD-10-CM | POA: Diagnosis not present

## 2014-10-13 DIAGNOSIS — N410 Acute prostatitis: Secondary | ICD-10-CM | POA: Diagnosis not present

## 2014-10-13 LAB — HEPATIC FUNCTION PANEL
ALT: 22 U/L (ref 0–53)
AST: 18 U/L (ref 0–37)
Albumin: 4.3 g/dL (ref 3.5–5.2)
Alkaline Phosphatase: 132 U/L — ABNORMAL HIGH (ref 39–117)
Bilirubin, Direct: 0.2 mg/dL (ref 0.0–0.3)
Total Bilirubin: 1 mg/dL (ref 0.2–1.2)
Total Protein: 7.3 g/dL (ref 6.0–8.3)

## 2014-10-13 LAB — URINALYSIS
Bilirubin Urine: NEGATIVE
Hgb urine dipstick: NEGATIVE
Ketones, ur: NEGATIVE
Leukocytes, UA: NEGATIVE
NITRITE: NEGATIVE
SPECIFIC GRAVITY, URINE: 1.02 (ref 1.000–1.030)
Total Protein, Urine: NEGATIVE
Urine Glucose: NEGATIVE
Urobilinogen, UA: 0.2 (ref 0.0–1.0)
pH: 6 (ref 5.0–8.0)

## 2014-10-13 LAB — CBC WITH DIFFERENTIAL/PLATELET
BASOS ABS: 0 10*3/uL (ref 0.0–0.1)
Basophils Relative: 0.3 % (ref 0.0–3.0)
EOS PCT: 2 % (ref 0.0–5.0)
Eosinophils Absolute: 0.2 10*3/uL (ref 0.0–0.7)
HCT: 47 % (ref 39.0–52.0)
Hemoglobin: 16.2 g/dL (ref 13.0–17.0)
Lymphocytes Relative: 26.6 % (ref 12.0–46.0)
Lymphs Abs: 2 10*3/uL (ref 0.7–4.0)
MCHC: 34.5 g/dL (ref 30.0–36.0)
MCV: 84.7 fl (ref 78.0–100.0)
MONO ABS: 0.9 10*3/uL (ref 0.1–1.0)
MONOS PCT: 11.6 % (ref 3.0–12.0)
NEUTROS PCT: 59.5 % (ref 43.0–77.0)
Neutro Abs: 4.5 10*3/uL (ref 1.4–7.7)
Platelets: 216 10*3/uL (ref 150.0–400.0)
RBC: 5.55 Mil/uL (ref 4.22–5.81)
RDW: 13.3 % (ref 11.5–15.5)
WBC: 7.6 10*3/uL (ref 4.0–10.5)

## 2014-10-13 LAB — BASIC METABOLIC PANEL
BUN: 12 mg/dL (ref 6–23)
CO2: 29 meq/L (ref 19–32)
Calcium: 9.7 mg/dL (ref 8.4–10.5)
Chloride: 105 mEq/L (ref 96–112)
Creatinine, Ser: 0.88 mg/dL (ref 0.40–1.50)
GFR: 110.94 mL/min (ref >60.00)
GLUCOSE: 106 mg/dL — AB (ref 70–99)
Potassium: 4 mEq/L (ref 3.5–5.1)
Sodium: 140 mEq/L (ref 135–145)

## 2014-10-13 LAB — VITAMIN B12: VITAMIN B 12: 401 pg/mL (ref 211–911)

## 2014-10-13 LAB — TSH: TSH: 2.1 u[IU]/mL (ref 0.35–4.50)

## 2014-10-13 LAB — LIPID PANEL
CHOL/HDL RATIO: 5
Cholesterol: 178 mg/dL (ref 0–200)
HDL: 35.8 mg/dL — AB (ref >39.00)
LDL Cholesterol: 115 mg/dL — ABNORMAL HIGH (ref 0–99)
NONHDL: 142.2
Triglycerides: 136 mg/dL (ref 0.0–149.0)
VLDL: 27.2 mg/dL (ref 0.0–40.0)

## 2014-10-13 LAB — PSA: PSA: 1.02 ng/mL (ref 0.10–4.00)

## 2014-12-07 ENCOUNTER — Telehealth: Payer: Self-pay

## 2014-12-07 NOTE — Telephone Encounter (Signed)
LVM for pt to call back as soon as possible.   RE: reschedule time of 12/15/14 appt to 11 am instead of 8:45.

## 2014-12-08 NOTE — Telephone Encounter (Signed)
Appointment changed

## 2014-12-15 ENCOUNTER — Ambulatory Visit (INDEPENDENT_AMBULATORY_CARE_PROVIDER_SITE_OTHER): Payer: Commercial Managed Care - HMO | Admitting: Internal Medicine

## 2014-12-15 ENCOUNTER — Ambulatory Visit: Payer: Self-pay | Admitting: Internal Medicine

## 2014-12-15 ENCOUNTER — Encounter: Payer: Self-pay | Admitting: Internal Medicine

## 2014-12-15 VITALS — BP 130/88 | HR 58 | Wt 222.0 lb

## 2014-12-15 DIAGNOSIS — E538 Deficiency of other specified B group vitamins: Secondary | ICD-10-CM | POA: Diagnosis not present

## 2014-12-15 DIAGNOSIS — R972 Elevated prostate specific antigen [PSA]: Secondary | ICD-10-CM

## 2014-12-15 DIAGNOSIS — M5440 Lumbago with sciatica, unspecified side: Secondary | ICD-10-CM

## 2014-12-15 DIAGNOSIS — I10 Essential (primary) hypertension: Secondary | ICD-10-CM | POA: Diagnosis not present

## 2014-12-15 DIAGNOSIS — J4520 Mild intermittent asthma, uncomplicated: Secondary | ICD-10-CM

## 2014-12-15 DIAGNOSIS — E785 Hyperlipidemia, unspecified: Secondary | ICD-10-CM

## 2014-12-15 MED ORDER — CIPROFLOXACIN HCL 500 MG PO TABS: 500.0000 mg | ORAL_TABLET | Freq: Two times a day (BID) | ORAL | Status: DC

## 2014-12-15 NOTE — Assessment & Plan Note (Signed)
Monitoring

## 2014-12-15 NOTE — Assessment & Plan Note (Signed)
Doing well 

## 2014-12-15 NOTE — Progress Notes (Signed)
Subjective:  Patient ID: [name] [name], male    DOB: Jul 07, 1945  Age: 69 y.o. MRN: [id number]  CC: No chief complaint on file.   HPI [name] [name] presents for HTN, B12 def f/u, RLE pain - better. Pt is going on a cruise in November.  Outpatient Prescriptions Prior to Visit  Medication Sig Dispense Refill  . albuterol (PROVENTIL HFA;VENTOLIN HFA) 108 (90 BASE) MCG/ACT inhaler Inhale 1-2 puffs into the lungs every 6 (six) hours as needed for wheezing or shortness of breath. 1 Inhaler 0  . Alcohol Swabs (ALCOHOL PREPS) PADS     . amLODipine-olmesartan (AZOR) 10-40 MG per tablet Take 1 tablet by mouth daily. 90 tablet 3  . Applicators (COTTON SWABS) SWAB     . azelastine (OPTIVAR) 0.05 % ophthalmic solution Place 1 drop into both eyes 2 (two) times daily. 6 mL 12  . carvedilol (COREG) 25 MG tablet 1TAKE 1 TABLET (25 MG TOTAL) BY MOUTH 2 (TWO) TIMES DAILY WITH A MEAL. 180 tablet 3  . Cholecalciferol 1000 UNITS tablet     . ciprofloxacin (CIPRO) 500 MG tablet Take 1 tablet (500 mg total) by mouth 2 (two) times daily. 20 tablet 0  . Fluticasone Furoate-Vilanterol (BREO ELLIPTA) 100-25 MCG/INH AEPB Inhale 1 Act into the lungs daily. 1 each 5  . loratadine (CLARITIN) 10 MG tablet Take 1 tablet (10 mg total) by mouth daily. 100 tablet 3  . triamcinolone cream (KENALOG) 0.1 % Apply 1 application topically 4 (four) times daily. prn 30 g 3  . vitamin B-12 (CYANOCOBALAMIN) 1000 MCG tablet daily.    . vitamin C (ASCORBIC ACID) 500 MG tablet daily.    . AZOR 10-40 MG per tablet TAKE 1 TABLET BY MOUTH DAILY. 90 tablet 3   No facility-administered medications prior to visit.    ROS Review of Systems  Constitutional: Negative for appetite change, fatigue and unexpected weight change.  HENT: Negative for congestion, nosebleeds, sneezing, sore throat and trouble swallowing.   Eyes: Negative for itching and visual disturbance.  Respiratory: Negative for cough.   Cardiovascular: Negative for chest  pain, palpitations and leg swelling.  Gastrointestinal: Negative for nausea, diarrhea, blood in stool and abdominal distention.  Genitourinary: Negative for frequency and hematuria.  Musculoskeletal: Positive for back pain and arthralgias. Negative for joint swelling, gait problem and neck pain.  Skin: Negative for rash.  Neurological: Negative for dizziness, tremors, speech difficulty and weakness.  Psychiatric/Behavioral: Negative for suicidal ideas, sleep disturbance, dysphoric mood and agitation. The patient is not nervous/anxious.     Objective:  BP 130/88 mmHg  Pulse 58  Wt 222 lb (100.699 kg)  SpO2 96%  BP Readings from Last 3 Encounters:  12/15/14 130/88  09/15/14 138/90  01/13/14 146/88    Wt Readings from Last 3 Encounters:  12/15/14 222 lb (100.699 kg)  09/15/14 223 lb (101.152 kg)  01/13/14 215 lb 12.8 oz (97.886 kg)    Physical Exam  Constitutional: He is oriented to person, place, and time. He appears well-developed. No distress.  NAD  HENT:  Mouth/Throat: Oropharynx is clear and moist.  Eyes: Conjunctivae are normal. Pupils are equal, round, and reactive to light.  Neck: Normal range of motion. No JVD present. No thyromegaly present.  Cardiovascular: Normal rate, regular rhythm, normal heart sounds and intact distal pulses.  Exam reveals no gallop and no friction rub.   No murmur heard. Pulmonary/Chest: Effort normal and breath sounds normal. No respiratory distress. He has no wheezes. He has  no rales. He exhibits no tenderness.  Abdominal: Soft. Bowel sounds are normal. He exhibits no distension and no mass. There is no tenderness. There is no rebound and no guarding.  Musculoskeletal: Normal range of motion. He exhibits no edema or tenderness.  Lymphadenopathy:    He has no cervical adenopathy.  Neurological: He is alert and oriented to person, place, and time. He has normal reflexes. No cranial nerve deficit. He exhibits normal muscle tone. He displays a  negative Romberg sign. Coordination and gait normal.  Skin: Skin is warm and dry. No rash noted.  Psychiatric: He has a normal mood and affect. His behavior is normal. Judgment and thought content normal.    Lab Results  Component Value Date   WBC 7.6 10/13/2014   HGB 16.2 10/13/2014   HCT 47.0 10/13/2014   PLT 216.0 10/13/2014   GLUCOSE 106* 10/13/2014   CHOL 178 10/13/2014   TRIG 136.0 10/13/2014   HDL 35.80* 10/13/2014   LDLDIRECT 134.0 02/08/2012   LDLCALC 115* 10/13/2014   ALT 22 10/13/2014   AST 18 10/13/2014   NA 140 10/13/2014   K 4.0 10/13/2014   CL 105 10/13/2014   CREATININE 0.88 10/13/2014   BUN 12 10/13/2014   CO2 29 10/13/2014   TSH 2.10 10/13/2014   PSA 1.02 10/13/2014   HGBA1C 5.6 05/08/2006    Dg Chest 2 View  05/30/2013   CLINICAL DATA:  Cough  EXAM: CHEST  2 VIEW  COMPARISON:  02/27/2012  FINDINGS: Bandlike lingula density compatible with atelectasis or scarring. Normal heart size. Stable prominent hilar vascularity. No edema pattern or pneumonia. No collapse or consolidation. Negative for effusion or pneumothorax. Trachea midline.  IMPRESSION: Lingula atelectasis versus scarring. Otherwise stable exam without acute process   Electronically Signed   By: Daryll Brod M.D.   On: 05/30/2013 11:20    Assessment & Plan:   Diagnoses and all orders for this visit:  Essential hypertension  B12 deficiency  Asthmatic bronchitis, mild intermittent, uncomplicated  Elevated PSA  Midline low back pain with sciatica, sciatica laterality unspecified  Dyslipidemia  I have discontinued [name] [name]. I am also having him maintain his albuterol, Fluticasone Furoate-Vilanterol, azelastine, loratadine, triamcinolone cream, vitamin C, vitamin B-12, Cholecalciferol, Cotton Swabs, Alcohol Preps, ciprofloxacin, carvedilol, amLODipine-olmesartan, Vitamin B Complex, and SENIOR MULTIVITAMIN PLUS.  Meds ordered this encounter  Medications  . B Complex Vitamins (VITAMIN  B COMPLEX) TABS    Sig: Take 1 tablet by mouth daily.  . Multiple Vitamins-Minerals (SENIOR MULTIVITAMIN PLUS) TABS    Sig: Take 1 tablet by mouth daily.     Follow-up: No Follow-up on file.  Walker Kehr, MD

## 2014-12-15 NOTE — Assessment & Plan Note (Signed)
  On diet  

## 2014-12-15 NOTE — Progress Notes (Signed)
Pre visit review using our clinic review tool, if applicable. No additional management support is needed unless otherwise documented below in the visit note. 

## 2014-12-15 NOTE — Assessment & Plan Note (Signed)
On B12 

## 2015-03-18 ENCOUNTER — Telehealth: Payer: Self-pay | Admitting: *Deleted

## 2015-03-18 MED ORDER — TADALAFIL 20 MG PO TABS: 20.0000 mg | ORAL_TABLET | Freq: Every day | ORAL | Status: DC | PRN

## 2015-03-18 NOTE — Telephone Encounter (Signed)
Left msg on triage requesting refill on his cialis.Called pt to verify pharmacy sent to [facility]

## 2015-05-26 ENCOUNTER — Telehealth: Payer: Self-pay

## 2015-05-26 NOTE — Telephone Encounter (Signed)
PA clinical information filled out. Given to PCP to sign so it can be faxed back to plan.

## 2015-05-30 NOTE — Telephone Encounter (Signed)
PA denied.   Is there an alternative?

## 2015-05-31 ENCOUNTER — Ambulatory Visit: Payer: Self-pay | Admitting: Internal Medicine

## 2015-05-31 MED ORDER — AMLODIPINE BESYLATE 10 MG PO TABS: 10.0000 mg | ORAL_TABLET | Freq: Every day | ORAL | Status: DC

## 2015-05-31 MED ORDER — VALSARTAN 320 MG PO TABS: 320.0000 mg | ORAL_TABLET | Freq: Every day | ORAL | Status: DC

## 2015-05-31 NOTE — Telephone Encounter (Signed)
OK Diovan and Amlodipine instead Thx

## 2015-05-31 NOTE — Telephone Encounter (Signed)
Called pt spoke with wife inform her md sent in another BP med to [facility]

## 2015-05-31 NOTE — Addendum Note (Signed)
Addended by: Cassandria Anger on: 05/31/2015 11:57 AM   Modules accepted: Orders, Medications

## 2015-06-01 ENCOUNTER — Encounter: Payer: Self-pay | Admitting: Internal Medicine

## 2015-06-01 ENCOUNTER — Ambulatory Visit (INDEPENDENT_AMBULATORY_CARE_PROVIDER_SITE_OTHER): Payer: Commercial Managed Care - HMO | Admitting: Internal Medicine

## 2015-06-01 VITALS — BP 150/88 | HR 57 | Wt 225.0 lb

## 2015-06-01 DIAGNOSIS — J309 Allergic rhinitis, unspecified: Secondary | ICD-10-CM | POA: Diagnosis not present

## 2015-06-01 DIAGNOSIS — R51 Headache: Secondary | ICD-10-CM

## 2015-06-01 DIAGNOSIS — L989 Disorder of the skin and subcutaneous tissue, unspecified: Secondary | ICD-10-CM | POA: Insufficient documentation

## 2015-06-01 DIAGNOSIS — I10 Essential (primary) hypertension: Secondary | ICD-10-CM

## 2015-06-01 DIAGNOSIS — R519 Headache, unspecified: Secondary | ICD-10-CM | POA: Insufficient documentation

## 2015-06-01 MED ORDER — CARVEDILOL 25 MG PO TABS: ORAL_TABLET | ORAL | Status: DC

## 2015-06-01 MED ORDER — DOXYCYCLINE HYCLATE 100 MG PO TABS: 100.0000 mg | ORAL_TABLET | Freq: Two times a day (BID) | ORAL | Status: DC

## 2015-06-01 NOTE — Assessment & Plan Note (Signed)
1/17 ?cellulitis. Doubt H zoster. Resolving Doxy Rx if relapsed

## 2015-06-01 NOTE — Assessment & Plan Note (Signed)
Doing well 

## 2015-06-01 NOTE — Progress Notes (Signed)
Subjective:  Patient ID: [name] [name], male    DOB: [birth year]  Age: 70 y.o. MRN: [id number]  CC: No chief complaint on file.   HPI [name] [name] presents for L side of the head and L posterior neck - resolved. L neck LN is still swollen F/u HTN - meds were changed due to insurance. He stopped Coreg  Outpatient Prescriptions Prior to Visit  Medication Sig Dispense Refill  . albuterol (PROVENTIL HFA;VENTOLIN HFA) 108 (90 BASE) MCG/ACT inhaler Inhale 1-2 puffs into the lungs every 6 (six) hours as needed for wheezing or shortness of breath. 1 Inhaler 0  . Alcohol Swabs (ALCOHOL PREPS) PADS     . amLODipine (NORVASC) 10 MG tablet Take 1 tablet (10 mg total) by mouth daily. 90 tablet 3  . Applicators (COTTON SWABS) SWAB     . azelastine (OPTIVAR) 0.05 % ophthalmic solution Place 1 drop into both eyes 2 (two) times daily. 6 mL 12  . B Complex Vitamins (VITAMIN B COMPLEX) TABS Take 1 tablet by mouth daily.    . carvedilol (COREG) 25 MG tablet 1TAKE 1 TABLET (25 MG TOTAL) BY MOUTH 2 (TWO) TIMES DAILY WITH A MEAL. 180 tablet 3  . Cholecalciferol 1000 UNITS tablet     . Fluticasone Furoate-Vilanterol (BREO ELLIPTA) 100-25 MCG/INH AEPB Inhale 1 Act into the lungs daily. 1 each 5  . loratadine (CLARITIN) 10 MG tablet Take 1 tablet (10 mg total) by mouth daily. 100 tablet 3  . Multiple Vitamins-Minerals (SENIOR MULTIVITAMIN PLUS) TABS Take 1 tablet by mouth daily.    . tadalafil (CIALIS) 20 MG tablet Take 1 tablet (20 mg total) by mouth daily as needed for erectile dysfunction. 10 tablet 0  . triamcinolone cream (KENALOG) 0.1 % Apply 1 application topically 4 (four) times daily. prn 30 g 3  . valsartan (DIOVAN) 320 MG tablet Take 1 tablet (320 mg total) by mouth daily. 90 tablet 3  . vitamin B-12 (CYANOCOBALAMIN) 1000 MCG tablet daily.    . vitamin C (ASCORBIC ACID) 500 MG tablet daily.    . ciprofloxacin (CIPRO) 500 MG tablet Take 1 tablet (500 mg total) by mouth 2 (two) times daily. 20 tablet 0     No facility-administered medications prior to visit.    ROS Review of Systems  Constitutional: Negative for appetite change, fatigue and unexpected weight change.  HENT: Negative for congestion, nosebleeds, sneezing, sore throat and trouble swallowing.   Eyes: Negative for itching and visual disturbance.  Respiratory: Negative for cough.   Cardiovascular: Negative for chest pain, palpitations and leg swelling.  Gastrointestinal: Negative for nausea, diarrhea, blood in stool and abdominal distention.  Genitourinary: Negative for frequency and hematuria.  Musculoskeletal: Negative for back pain, joint swelling, gait problem and neck pain.  Skin: Positive for color change. Negative for rash and wound.  Neurological: Negative for dizziness, tremors, speech difficulty and weakness.  Psychiatric/Behavioral: Negative for sleep disturbance, dysphoric mood and agitation. The patient is not nervous/anxious.     Objective:  BP 150/88 mmHg  Pulse 57  Wt 225 lb (102.059 kg)  SpO2 95%  BP Readings from Last 3 Encounters:  06/01/15 150/88  12/15/14 130/88  09/15/14 138/90    Wt Readings from Last 3 Encounters:  06/01/15 225 lb (102.059 kg)  12/15/14 222 lb (100.699 kg)  09/15/14 223 lb (101.152 kg)    Physical Exam  Constitutional: He is oriented to person, place, and time. He appears well-developed. No distress.  NAD  HENT:  Mouth/Throat: Oropharynx is clear and moist.  Eyes: Conjunctivae are normal. Pupils are equal, round, and reactive to light.  Neck: Normal range of motion. No JVD present. No thyromegaly present.  Cardiovascular: Normal rate, regular rhythm, normal heart sounds and intact distal pulses.  Exam reveals no gallop and no friction rub.   No murmur heard. Pulmonary/Chest: Effort normal and breath sounds normal. No respiratory distress. He has no wheezes. He has no rales. He exhibits no tenderness.  Abdominal: Soft. Bowel sounds are normal. He exhibits no distension  and no mass. There is no tenderness. There is no rebound and no guarding.  Musculoskeletal: Normal range of motion. He exhibits no edema or tenderness.  Lymphadenopathy:    He has no cervical adenopathy.  Neurological: He is alert and oriented to person, place, and time. He has normal reflexes. No cranial nerve deficit. He exhibits normal muscle tone. He displays a negative Romberg sign. Coordination and gait normal.  Skin: Skin is warm and dry. No rash noted.  Psychiatric: He has a normal mood and affect. His behavior is normal. Judgment and thought content normal.  L side f the scalp w/hyperpigmented area on L temporal region L prox neck w/1cm LN  Lab Results  Component Value Date   WBC 7.6 10/13/2014   HGB 16.2 10/13/2014   HCT 47.0 10/13/2014   PLT 216.0 10/13/2014   GLUCOSE 106* 10/13/2014   CHOL 178 10/13/2014   TRIG 136.0 10/13/2014   HDL 35.80* 10/13/2014   LDLDIRECT 134.0 02/08/2012   LDLCALC 115* 10/13/2014   ALT 22 10/13/2014   AST 18 10/13/2014   NA 140 10/13/2014   K 4.0 10/13/2014   CL 105 10/13/2014   CREATININE 0.88 10/13/2014   BUN 12 10/13/2014   CO2 29 10/13/2014   TSH 2.10 10/13/2014   PSA 1.02 10/13/2014   HGBA1C 5.6 05/08/2006    Dg Chest 2 View  05/30/2013  CLINICAL DATA:  Cough EXAM: CHEST  2 VIEW COMPARISON:  02/27/2012 FINDINGS: Bandlike lingula density compatible with atelectasis or scarring. Normal heart size. Stable prominent hilar vascularity. No edema pattern or pneumonia. No collapse or consolidation. Negative for effusion or pneumothorax. Trachea midline. IMPRESSION: Lingula atelectasis versus scarring. Otherwise stable exam without acute process Electronically Signed   By: Daryll Brod M.D.   On: 05/30/2013 11:20    Assessment & Plan:   There are no diagnoses linked to this encounter. I have discontinued Mr. Eynon's ciprofloxacin. I am also having him maintain his albuterol, Fluticasone Furoate-Vilanterol, azelastine, loratadine,  triamcinolone cream, vitamin C, vitamin B-12, Cholecalciferol, Cotton Swabs, Alcohol Preps, carvedilol, Vitamin B Complex, SENIOR MULTIVITAMIN PLUS, tadalafil, amLODipine, and valsartan.  No orders of the defined types were placed in this encounter.     Follow-up: No Follow-up on file.  Walker Kehr, MD

## 2015-06-01 NOTE — Progress Notes (Signed)
Pre visit review using our clinic review tool, if applicable. No additional management support is needed unless otherwise documented below in the visit note. 

## 2015-06-01 NOTE — Assessment & Plan Note (Signed)
Re-start Coreg Diovan and Amlodipine

## 2015-06-17 ENCOUNTER — Ambulatory Visit: Payer: Self-pay | Admitting: Internal Medicine

## 2015-06-22 ENCOUNTER — Encounter: Payer: Self-pay | Admitting: Internal Medicine

## 2015-06-22 ENCOUNTER — Ambulatory Visit (INDEPENDENT_AMBULATORY_CARE_PROVIDER_SITE_OTHER): Payer: Commercial Managed Care - HMO | Admitting: Internal Medicine

## 2015-06-22 ENCOUNTER — Other Ambulatory Visit: Payer: Worker's Compensation

## 2015-06-22 VITALS — BP 144/78 | HR 51 | Ht 72.0 in | Wt 220.0 lb

## 2015-06-22 DIAGNOSIS — I10 Essential (primary) hypertension: Secondary | ICD-10-CM | POA: Diagnosis not present

## 2015-06-22 DIAGNOSIS — R079 Chest pain, unspecified: Secondary | ICD-10-CM

## 2015-06-22 DIAGNOSIS — Z0000 Encounter for general adult medical examination without abnormal findings: Secondary | ICD-10-CM | POA: Diagnosis not present

## 2015-06-22 DIAGNOSIS — E538 Deficiency of other specified B group vitamins: Secondary | ICD-10-CM

## 2015-06-22 DIAGNOSIS — R972 Elevated prostate specific antigen [PSA]: Secondary | ICD-10-CM

## 2015-06-22 DIAGNOSIS — Z23 Encounter for immunization: Secondary | ICD-10-CM | POA: Diagnosis not present

## 2015-06-22 DIAGNOSIS — R0789 Other chest pain: Secondary | ICD-10-CM | POA: Insufficient documentation

## 2015-06-22 DIAGNOSIS — E559 Vitamin D deficiency, unspecified: Secondary | ICD-10-CM

## 2015-06-22 MED ORDER — TADALAFIL 20 MG PO TABS: 20.0000 mg | ORAL_TABLET | Freq: Every day | ORAL | Status: DC | PRN

## 2015-06-22 MED ORDER — RANITIDINE HCL 150 MG PO TABS: 150.0000 mg | ORAL_TABLET | Freq: Two times a day (BID) | ORAL | Status: DC

## 2015-06-22 NOTE — Assessment & Plan Note (Signed)
Free PSA 

## 2015-06-22 NOTE — Progress Notes (Signed)
Pre visit review using our clinic review tool, if applicable. No additional management support is needed unless otherwise documented below in the visit note. 

## 2015-06-22 NOTE — Assessment & Plan Note (Signed)
EKG

## 2015-06-22 NOTE — Patient Instructions (Signed)

## 2015-06-22 NOTE — Assessment & Plan Note (Signed)

## 2015-06-22 NOTE — Assessment & Plan Note (Signed)
Coreg, Diovan and Amlodipine 

## 2015-06-22 NOTE — Assessment & Plan Note (Signed)
On B12 

## 2015-06-22 NOTE — Progress Notes (Signed)
Subjective:  Patient ID: [name] [name], male    DOB: May 26, 1945  Age: 70 y.o. MRN: [id number]  CC: No chief complaint on file.   HPI CHORD MCCURTY presents for a well exam C/o short L achy CP at times off and on x months. It can happen at rest and w/exercise: no more w/3 min C/o stress, anxiety  Outpatient Prescriptions Prior to Visit  Medication Sig Dispense Refill  . albuterol (PROVENTIL HFA;VENTOLIN HFA) 108 (90 BASE) MCG/ACT inhaler Inhale 1-2 puffs into the lungs every 6 (six) hours as needed for wheezing or shortness of breath. 1 Inhaler 0  . Alcohol Swabs (ALCOHOL PREPS) PADS     . amLODipine (NORVASC) 10 MG tablet Take 1 tablet (10 mg total) by mouth daily. 90 tablet 3  . Applicators (COTTON SWABS) SWAB     . azelastine (OPTIVAR) 0.05 % ophthalmic solution Place 1 drop into both eyes 2 (two) times daily. 6 mL 12  . B Complex Vitamins (VITAMIN B COMPLEX) TABS Take 1 tablet by mouth daily.    . carvedilol (COREG) 25 MG tablet 1TAKE 1 TABLET (25 MG TOTAL) BY MOUTH 2 (TWO) TIMES DAILY WITH A MEAL. 180 tablet 3  . Cholecalciferol 1000 UNITS tablet     . doxycycline (VIBRA-TABS) 100 MG tablet Take 1 tablet (100 mg total) by mouth 2 (two) times daily. 20 tablet 0  . Fluticasone Furoate-Vilanterol (BREO ELLIPTA) 100-25 MCG/INH AEPB Inhale 1 Act into the lungs daily. 1 each 5  . loratadine (CLARITIN) 10 MG tablet Take 1 tablet (10 mg total) by mouth daily. 100 tablet 3  . Multiple Vitamins-Minerals (SENIOR MULTIVITAMIN PLUS) TABS Take 1 tablet by mouth daily.    . tadalafil (CIALIS) 20 MG tablet Take 1 tablet (20 mg total) by mouth daily as needed for erectile dysfunction. 10 tablet 0  . triamcinolone cream (KENALOG) 0.1 % Apply 1 application topically 4 (four) times daily. prn 30 g 3  . valsartan (DIOVAN) 320 MG tablet Take 1 tablet (320 mg total) by mouth daily. 90 tablet 3  . vitamin B-12 (CYANOCOBALAMIN) 1000 MCG tablet daily.    . vitamin C (ASCORBIC ACID) 500 MG tablet daily.      No facility-administered medications prior to visit.    ROS Review of Systems  Constitutional: Negative for appetite change, fatigue and unexpected weight change.  HENT: Negative for congestion, nosebleeds, sneezing, sore throat and trouble swallowing.   Eyes: Negative for itching and visual disturbance.  Respiratory: Positive for chest tightness. Negative for cough and shortness of breath.   Cardiovascular: Positive for chest pain. Negative for palpitations and leg swelling.  Gastrointestinal: Negative for nausea, diarrhea, blood in stool and abdominal distention.  Genitourinary: Negative for frequency and hematuria.  Musculoskeletal: Negative for back pain, joint swelling, gait problem and neck pain.  Skin: Negative for rash.  Neurological: Negative for dizziness, tremors, speech difficulty and weakness.  Psychiatric/Behavioral: Negative for suicidal ideas, sleep disturbance, dysphoric mood, decreased concentration and agitation. The patient is nervous/anxious.     Objective:  BP 144/78 mmHg  Pulse 51  Ht 6' (1.829 m)  Wt 220 lb (99.791 kg)  BMI 29.83 kg/m2  SpO2 96%  BP Readings from Last 3 Encounters:  06/22/15 144/78  06/01/15 150/88  12/15/14 130/88    Wt Readings from Last 3 Encounters:  06/22/15 220 lb (99.791 kg)  06/01/15 225 lb (102.059 kg)  12/15/14 222 lb (100.699 kg)    Physical Exam  Constitutional: He is oriented  to person, place, and time. He appears well-developed and well-nourished. No distress.  HENT:  Head: Normocephalic and atraumatic.  Right Ear: External ear normal.  Left Ear: External ear normal.  Nose: Nose normal.  Mouth/Throat: Oropharynx is clear and moist. No oropharyngeal exudate.  Eyes: Conjunctivae and EOM are normal. Pupils are equal, round, and reactive to light. Right eye exhibits no discharge. Left eye exhibits no discharge. No scleral icterus.  Neck: Normal range of motion. Neck supple. No JVD present. No tracheal deviation  present. No thyromegaly present.  Cardiovascular: Normal rate, regular rhythm, normal heart sounds and intact distal pulses.  Exam reveals no gallop and no friction rub.   No murmur heard. Pulmonary/Chest: Effort normal and breath sounds normal. No stridor. No respiratory distress. He has no wheezes. He has no rales. He exhibits no tenderness.  Abdominal: Soft. Bowel sounds are normal. He exhibits no distension and no mass. There is no tenderness. There is no rebound and no guarding.  Genitourinary: Rectum normal, prostate normal and penis normal. Guaiac negative stool. No penile tenderness.  Musculoskeletal: Normal range of motion. He exhibits no edema or tenderness.  Lymphadenopathy:    He has no cervical adenopathy.  Neurological: He is alert and oriented to person, place, and time. He has normal reflexes. No cranial nerve deficit. He exhibits normal muscle tone. Coordination normal.  Skin: Skin is warm and dry. No rash noted. He is not diaphoretic. No erythema. No pallor.  Psychiatric: He has a normal mood and affect. His behavior is normal. Judgment and thought content normal.   EKG NSR  Lab Results  Component Value Date   WBC 7.6 10/13/2014   HGB 16.2 10/13/2014   HCT 47.0 10/13/2014   PLT 216.0 10/13/2014   GLUCOSE 106* 10/13/2014   CHOL 178 10/13/2014   TRIG 136.0 10/13/2014   HDL 35.80* 10/13/2014   LDLDIRECT 134.0 02/08/2012   LDLCALC 115* 10/13/2014   ALT 22 10/13/2014   AST 18 10/13/2014   NA 140 10/13/2014   K 4.0 10/13/2014   CL 105 10/13/2014   CREATININE 0.88 10/13/2014   BUN 12 10/13/2014   CO2 29 10/13/2014   TSH 2.10 10/13/2014   PSA 1.02 10/13/2014   HGBA1C 5.6 05/08/2006    Dg Chest 2 View  05/30/2013  CLINICAL DATA:  Cough EXAM: CHEST  2 VIEW COMPARISON:  02/27/2012 FINDINGS: Bandlike lingula density compatible with atelectasis or scarring. Normal heart size. Stable prominent hilar vascularity. No edema pattern or pneumonia. No collapse or consolidation.  Negative for effusion or pneumothorax. Trachea midline. IMPRESSION: Lingula atelectasis versus scarring. Otherwise stable exam without acute process Electronically Signed   By: Daryll Brod M.D.   On: 05/30/2013 11:20    Assessment & Plan:   There are no diagnoses linked to this encounter. I am having Mr. Gills maintain his albuterol, Fluticasone Furoate-Vilanterol, azelastine, loratadine, triamcinolone cream, vitamin C, vitamin B-12, Cholecalciferol, Cotton Swabs, Alcohol Preps, Vitamin B Complex, SENIOR MULTIVITAMIN PLUS, tadalafil, amLODipine, valsartan, carvedilol, and doxycycline.  No orders of the defined types were placed in this encounter.     Follow-up: No Follow-up on file.  Walker Kehr, MD

## 2015-06-23 ENCOUNTER — Other Ambulatory Visit: Payer: Self-pay | Admitting: Internal Medicine

## 2015-06-23 ENCOUNTER — Other Ambulatory Visit (INDEPENDENT_AMBULATORY_CARE_PROVIDER_SITE_OTHER): Payer: Commercial Managed Care - HMO

## 2015-06-23 DIAGNOSIS — I10 Essential (primary) hypertension: Secondary | ICD-10-CM

## 2015-06-23 DIAGNOSIS — E538 Deficiency of other specified B group vitamins: Secondary | ICD-10-CM

## 2015-06-23 DIAGNOSIS — G320 Subacute combined degeneration of spinal cord in diseases classified elsewhere: Secondary | ICD-10-CM | POA: Diagnosis not present

## 2015-06-23 DIAGNOSIS — Z0000 Encounter for general adult medical examination without abnormal findings: Secondary | ICD-10-CM

## 2015-06-23 DIAGNOSIS — R972 Elevated prostate specific antigen [PSA]: Secondary | ICD-10-CM | POA: Diagnosis not present

## 2015-06-23 DIAGNOSIS — R079 Chest pain, unspecified: Secondary | ICD-10-CM

## 2015-06-23 LAB — URINALYSIS, ROUTINE W REFLEX MICROSCOPIC
Bilirubin Urine: NEGATIVE
Hgb urine dipstick: NEGATIVE
Ketones, ur: NEGATIVE
Leukocytes, UA: NEGATIVE
Nitrite: NEGATIVE
PH: 6 (ref 5.0–8.0)
RBC / HPF: NONE SEEN (ref 0–?)
SPECIFIC GRAVITY, URINE: 1.015 (ref 1.000–1.030)
Total Protein, Urine: NEGATIVE
URINE GLUCOSE: NEGATIVE
Urobilinogen, UA: 0.2 (ref 0.0–1.0)
WBC UA: NONE SEEN (ref 0–?)

## 2015-06-23 LAB — HEPATIC FUNCTION PANEL
ALBUMIN: 4.2 g/dL (ref 3.5–5.2)
ALK PHOS: 107 U/L (ref 39–117)
ALT: 16 U/L (ref 0–53)
AST: 16 U/L (ref 0–37)
Bilirubin, Direct: 0.2 mg/dL (ref 0.0–0.3)
TOTAL PROTEIN: 7.4 g/dL (ref 6.0–8.3)
Total Bilirubin: 0.8 mg/dL (ref 0.2–1.2)

## 2015-06-23 LAB — CBC WITH DIFFERENTIAL/PLATELET
BASOS ABS: 0 10*3/uL (ref 0.0–0.1)
BASOS PCT: 0.4 % (ref 0.0–3.0)
EOS PCT: 1.5 % (ref 0.0–5.0)
Eosinophils Absolute: 0.1 10*3/uL (ref 0.0–0.7)
HEMATOCRIT: 45.6 % (ref 39.0–52.0)
Hemoglobin: 15.2 g/dL (ref 13.0–17.0)
LYMPHS ABS: 2.3 10*3/uL (ref 0.7–4.0)
LYMPHS PCT: 29 % (ref 12.0–46.0)
MCHC: 33.3 g/dL (ref 30.0–36.0)
MCV: 87.1 fl (ref 78.0–100.0)
MONOS PCT: 14.1 % — AB (ref 3.0–12.0)
Monocytes Absolute: 1.1 10*3/uL — ABNORMAL HIGH (ref 0.1–1.0)
NEUTROS ABS: 4.3 10*3/uL (ref 1.4–7.7)
NEUTROS PCT: 55 % (ref 43.0–77.0)
PLATELETS: 212 10*3/uL (ref 150.0–400.0)
RBC: 5.24 Mil/uL (ref 4.22–5.81)
RDW: 13.7 % (ref 11.5–15.5)
WBC: 7.8 10*3/uL (ref 4.0–10.5)

## 2015-06-23 LAB — LIPID PANEL
CHOL/HDL RATIO: 5
Cholesterol: 167 mg/dL (ref 0–200)
HDL: 35.8 mg/dL — AB (ref >39.00)
LDL Cholesterol: 114 mg/dL — ABNORMAL HIGH (ref 0–99)
NonHDL: 130.74
TRIGLYCERIDES: 83 mg/dL (ref 0.0–149.0)
VLDL: 16.6 mg/dL (ref 0.0–40.0)

## 2015-06-23 LAB — BASIC METABOLIC PANEL
BUN: 12 mg/dL (ref 6–23)
CALCIUM: 9.7 mg/dL (ref 8.4–10.5)
CHLORIDE: 104 meq/L (ref 96–112)
CO2: 28 mEq/L (ref 19–32)
CREATININE: 0.83 mg/dL (ref 0.40–1.50)
GFR: 118.44 mL/min (ref >60.00)
Glucose, Bld: 95 mg/dL (ref 70–99)
Potassium: 4.3 mEq/L (ref 3.5–5.1)
Sodium: 139 mEq/L (ref 135–145)

## 2015-06-23 LAB — PSA: PSA: 0.86 ng/mL (ref 0.10–4.00)

## 2015-06-23 LAB — VITAMIN D 25 HYDROXY (VIT D DEFICIENCY, FRACTURES): VITD: 23.35 ng/mL — ABNORMAL LOW (ref 30.00–100.00)

## 2015-06-23 LAB — HEPATITIS C ANTIBODY: HCV Ab: NEGATIVE

## 2015-06-23 LAB — TSH: TSH: 2.6 u[IU]/mL (ref 0.35–4.50)

## 2015-06-23 LAB — VITAMIN B12: Vitamin B-12: 188 pg/mL — ABNORMAL LOW (ref 211–911)

## 2015-06-23 MED ORDER — ERGOCALCIFEROL 1.25 MG (50000 UT) PO CAPS: 50000.0000 [IU] | ORAL_CAPSULE | ORAL | Status: DC

## 2015-07-05 ENCOUNTER — Ambulatory Visit (INDEPENDENT_AMBULATORY_CARE_PROVIDER_SITE_OTHER): Payer: Commercial Managed Care - HMO

## 2015-07-05 DIAGNOSIS — E538 Deficiency of other specified B group vitamins: Secondary | ICD-10-CM | POA: Diagnosis not present

## 2015-07-05 MED ORDER — CYANOCOBALAMIN 1000 MCG/ML IJ SOLN
1000.0000 ug | Freq: Once | INTRAMUSCULAR | Status: AC
  Administered 2015-07-05: 1000 ug via INTRAMUSCULAR

## 2015-07-26 ENCOUNTER — Encounter: Payer: Self-pay | Admitting: Internal Medicine

## 2015-08-05 ENCOUNTER — Other Ambulatory Visit: Payer: Self-pay | Admitting: Internal Medicine

## 2015-11-23 DIAGNOSIS — Z0100 Encounter for examination of eyes and vision without abnormal findings: Secondary | ICD-10-CM | POA: Diagnosis not present

## 2015-12-20 ENCOUNTER — Ambulatory Visit (INDEPENDENT_AMBULATORY_CARE_PROVIDER_SITE_OTHER): Payer: Commercial Managed Care - HMO | Admitting: Internal Medicine

## 2015-12-20 ENCOUNTER — Encounter: Payer: Self-pay | Admitting: Internal Medicine

## 2015-12-20 DIAGNOSIS — N529 Male erectile dysfunction, unspecified: Secondary | ICD-10-CM | POA: Diagnosis not present

## 2015-12-20 DIAGNOSIS — I10 Essential (primary) hypertension: Secondary | ICD-10-CM

## 2015-12-20 DIAGNOSIS — G8929 Other chronic pain: Secondary | ICD-10-CM

## 2015-12-20 DIAGNOSIS — E538 Deficiency of other specified B group vitamins: Secondary | ICD-10-CM

## 2015-12-20 DIAGNOSIS — R972 Elevated prostate specific antigen [PSA]: Secondary | ICD-10-CM

## 2015-12-20 DIAGNOSIS — M5440 Lumbago with sciatica, unspecified side: Secondary | ICD-10-CM

## 2015-12-20 NOTE — Assessment & Plan Note (Signed)
Coreg, Diovan and Amlodipine 

## 2015-12-20 NOTE — Progress Notes (Signed)
Subjective:  Patient ID: [name] [name], male    DOB: 06-22-1945  Age: 70 y.o. MRN: [id number]  CC: No chief complaint on file.   HPI [name] [name] presents for Vit B12 def, HTN, ED, LBP f/u  Outpatient Medications Prior to Visit  Medication Sig Dispense Refill  . Alcohol Swabs (ALCOHOL PREPS) PADS     . amLODipine (NORVASC) 10 MG tablet Take 1 tablet (10 mg total) by mouth daily. 90 tablet 3  . azelastine (OPTIVAR) 0.05 % ophthalmic solution Place 1 drop into both eyes 2 (two) times daily. 6 mL 12  . B Complex Vitamins (VITAMIN B COMPLEX) TABS Take 1 tablet by mouth daily.    . carvedilol (COREG) 25 MG tablet 1TAKE 1 TABLET (25 MG TOTAL) BY MOUTH 2 (TWO) TIMES DAILY WITH A MEAL. 180 tablet 3  . Cholecalciferol 1000 UNITS tablet     . ergocalciferol (VITAMIN D2) 50000 units capsule Take 1 capsule (50,000 Units total) by mouth once a week. 6 capsule 0  . loratadine (CLARITIN) 10 MG tablet Take 1 tablet (10 mg total) by mouth daily. 100 tablet 3  . Multiple Vitamins-Minerals (SENIOR MULTIVITAMIN PLUS) TABS Take 1 tablet by mouth daily.    . ranitidine (ZANTAC) 150 MG tablet Take 1 tablet (150 mg total) by mouth 2 (two) times daily. 60 tablet 3  . tadalafil (CIALIS) 20 MG tablet Take 1 tablet (20 mg total) by mouth daily as needed for erectile dysfunction. 30 tablet 3  . valsartan (DIOVAN) 320 MG tablet Take 1 tablet (320 mg total) by mouth daily. 90 tablet 3   No facility-administered medications prior to visit.     ROS Review of Systems  Constitutional: Negative for appetite change, fatigue and unexpected weight change.  HENT: Negative for congestion, nosebleeds, sneezing, sore throat and trouble swallowing.   Eyes: Negative for itching and visual disturbance.  Respiratory: Negative for cough.   Cardiovascular: Negative for chest pain, palpitations and leg swelling.  Gastrointestinal: Negative for abdominal distention, blood in stool, diarrhea and nausea.  Genitourinary: Negative  for frequency and hematuria.  Musculoskeletal: Negative for back pain, gait problem, joint swelling and neck pain.  Skin: Negative for rash.  Neurological: Negative for dizziness, tremors, speech difficulty and weakness.  Psychiatric/Behavioral: Negative for agitation, dysphoric mood and sleep disturbance. The patient is not nervous/anxious.     Objective:  BP 128/78   Pulse (!) 49   Wt 220 lb (99.8 kg)   SpO2 96%   BMI 29.84 kg/m   BP Readings from Last 3 Encounters:  12/20/15 128/78  06/22/15 (!) 144/78  06/01/15 (!) 150/88    Wt Readings from Last 3 Encounters:  12/20/15 220 lb (99.8 kg)  06/22/15 220 lb (99.8 kg)  06/01/15 225 lb (102.1 kg)    Physical Exam  Constitutional: He is oriented to person, place, and time. He appears well-developed. No distress.  NAD  HENT:  Mouth/Throat: Oropharynx is clear and moist.  Eyes: Conjunctivae are normal. Pupils are equal, round, and reactive to light.  Neck: Normal range of motion. No JVD present. No thyromegaly present.  Cardiovascular: Normal rate, regular rhythm, normal heart sounds and intact distal pulses.  Exam reveals no gallop and no friction rub.   No murmur heard. Pulmonary/Chest: Effort normal and breath sounds normal. No respiratory distress. He has no wheezes. He has no rales. He exhibits no tenderness.  Abdominal: Soft. Bowel sounds are normal. He exhibits no distension and no mass. There is no  tenderness. There is no rebound and no guarding.  Musculoskeletal: Normal range of motion. He exhibits tenderness. He exhibits no edema.  Lymphadenopathy:    He has no cervical adenopathy.  Neurological: He is alert and oriented to person, place, and time. He has normal reflexes. No cranial nerve deficit. He exhibits normal muscle tone. He displays a negative Romberg sign. Coordination and gait normal.  Skin: Skin is warm and dry. No rash noted.  Psychiatric: He has a normal mood and affect. His behavior is normal. Judgment  and thought content normal.    Lab Results  Component Value Date   WBC 7.8 06/23/2015   HGB 15.2 06/23/2015   HCT 45.6 06/23/2015   PLT 212.0 06/23/2015   GLUCOSE 95 06/23/2015   CHOL 167 06/23/2015   TRIG 83.0 06/23/2015   HDL 35.80 (L) 06/23/2015   LDLDIRECT 134.0 02/08/2012   LDLCALC 114 (H) 06/23/2015   ALT 16 06/23/2015   AST 16 06/23/2015   NA 139 06/23/2015   K 4.3 06/23/2015   CL 104 06/23/2015   CREATININE 0.83 06/23/2015   BUN 12 06/23/2015   CO2 28 06/23/2015   TSH 2.60 06/23/2015   PSA 0.86 06/23/2015   HGBA1C 5.6 05/08/2006    Dg Chest 2 View  Result Date: 05/30/2013 CLINICAL DATA:  Cough EXAM: CHEST  2 VIEW COMPARISON:  02/27/2012 FINDINGS: Bandlike lingula density compatible with atelectasis or scarring. Normal heart size. Stable prominent hilar vascularity. No edema pattern or pneumonia. No collapse or consolidation. Negative for effusion or pneumothorax. Trachea midline. IMPRESSION: Lingula atelectasis versus scarring. Otherwise stable exam without acute process Electronically Signed   By: Daryll Brod M.D.   On: 05/30/2013 11:20    Assessment & Plan:   There are no diagnoses linked to this encounter. I am having [name] maintain his azelastine, loratadine, Cholecalciferol, Alcohol Preps, Vitamin B Complex, SENIOR MULTIVITAMIN PLUS, amLODipine, valsartan, carvedilol, tadalafil, ranitidine, and ergocalciferol.  No orders of the defined types were placed in this encounter.    Follow-up: No Follow-up on file.  Walker Kehr, MD

## 2015-12-20 NOTE — Assessment & Plan Note (Signed)
Cialis prn 

## 2015-12-20 NOTE — Assessment & Plan Note (Signed)
Re-started B12 Risks associated with treatment noncompliance were discussed. Compliance was encouraged.

## 2015-12-20 NOTE — Progress Notes (Signed)
Pre visit review using our clinic review tool, if applicable. No additional management support is needed unless otherwise documented below in the visit note. 

## 2015-12-20 NOTE — Assessment & Plan Note (Signed)
PSA q 12 mo °

## 2015-12-20 NOTE — Assessment & Plan Note (Signed)
Started to work out

## 2016-02-09 ENCOUNTER — Ambulatory Visit (INDEPENDENT_AMBULATORY_CARE_PROVIDER_SITE_OTHER): Payer: Commercial Managed Care - HMO | Admitting: Internal Medicine

## 2016-02-09 ENCOUNTER — Encounter: Payer: Self-pay | Admitting: Internal Medicine

## 2016-02-09 VITALS — BP 136/74 | HR 79 | Temp 98.7°F | Resp 20 | Wt 219.0 lb

## 2016-02-09 DIAGNOSIS — I10 Essential (primary) hypertension: Secondary | ICD-10-CM | POA: Diagnosis not present

## 2016-02-09 DIAGNOSIS — H1013 Acute atopic conjunctivitis, bilateral: Secondary | ICD-10-CM

## 2016-02-09 DIAGNOSIS — J309 Allergic rhinitis, unspecified: Secondary | ICD-10-CM

## 2016-02-09 NOTE — Patient Instructions (Addendum)
You had the steroid shot today  Please take all new medication as prescribed - the prednisone  Please also start the OTC Zyrtec AND Nasacort for allergies, and zaditor for eyes  Please continue all other medications as before, and refills have been done if requested.  Please have the pharmacy call with any other refills you may need.  Please keep your appointments with your specialists as you may have planned

## 2016-02-09 NOTE — Progress Notes (Signed)
Pre visit review using our clinic review tool, if applicable. No additional management support is needed unless otherwise documented below in the visit note. 

## 2016-02-09 NOTE — Progress Notes (Signed)
Subjective:    Patient ID: [name] [name], male    DOB: [birth year], 70 y.o.   MRN: [id number]  HPI  Here to f/u with acute; Does have several wks ongoing nasal allergy symptoms with clearish congestion, itch and sneezing, without fever, pain, ST, cough, swelling or wheezing, but also with eye itch, puffiness, heaviness discomfort but not tender, and slight weepiness, wakes up with granules in the eyes in the am  Pt denies chest pain, increased sob or doe, wheezing, orthopnea, PND, increased LE swelling, palpitations, dizziness or syncope.  Pt denies new neurological symptoms such as new headache, or facial or extremity weakness or numbness   Past Medical History:  Diagnosis Date  . Arthritis   . BPH (benign prostatic hyperplasia)   . ED (erectile dysfunction)   . Heart murmur    hx  . Hyperlipidemia   . Hypertension   . LBP (low back pain)   . PONV (postoperative nausea and vomiting)    with epidural inj  . Vitamin B 12 deficiency    Past Surgical History:  Procedure Laterality Date  . KNEE ARTHROSCOPY  02   Right    reports that he has been smoking Cigars and Cigarettes.  He has a 10.25 pack-year smoking history. He does not have any smokeless tobacco history on file. He reports that he drinks alcohol. He reports that he does not use drugs. family history includes Alcohol abuse in his father and mother; Cirrhosis in his father and mother; Heart disease (age of onset: 34) in his sister. Allergies  Allergen Reactions  . Hydrochlorothiazide     REACTION: HA, eyes hurt   Current Outpatient Prescriptions on File Prior to Visit  Medication Sig Dispense Refill  . Alcohol Swabs (ALCOHOL PREPS) PADS     . amLODipine (NORVASC) 10 MG tablet Take 1 tablet (10 mg total) by mouth daily. 90 tablet 3  . azelastine (OPTIVAR) 0.05 % ophthalmic solution Place 1 drop into both eyes 2 (two) times daily. 6 mL 12  . B Complex Vitamins (VITAMIN B COMPLEX) TABS Take 1 tablet by mouth daily.    .  carvedilol (COREG) 25 MG tablet 1TAKE 1 TABLET (25 MG TOTAL) BY MOUTH 2 (TWO) TIMES DAILY WITH A MEAL. 180 tablet 3  . Cholecalciferol 1000 UNITS tablet     . ergocalciferol (VITAMIN D2) 50000 units capsule Take 1 capsule (50,000 Units total) by mouth once a week. 6 capsule 0  . loratadine (CLARITIN) 10 MG tablet Take 1 tablet (10 mg total) by mouth daily. 100 tablet 3  . Multiple Vitamins-Minerals (SENIOR MULTIVITAMIN PLUS) TABS Take 1 tablet by mouth daily.    . ranitidine (ZANTAC) 150 MG tablet Take 1 tablet (150 mg total) by mouth 2 (two) times daily. 60 tablet 3  . tadalafil (CIALIS) 20 MG tablet Take 1 tablet (20 mg total) by mouth daily as needed for erectile dysfunction. 30 tablet 3  . valsartan (DIOVAN) 320 MG tablet Take 1 tablet (320 mg total) by mouth daily. 90 tablet 3  . [DISCONTINUED] spironolactone (ALDACTONE) 50 MG tablet Take 50 mg by mouth daily. For blood pressure      No current facility-administered medications on file prior to visit.    Review of Systems  Constitutional: Negative for unusual diaphoresis or night sweats HENT: Negative for ear swelling or discharge Eyes: Negative for worsening visual haziness  Respiratory: Negative for choking and stridor.   Gastrointestinal: Negative for distension or worsening eructation Genitourinary: Negative for retention  or change in urine volume.  Musculoskeletal: Negative for other MSK pain or swelling Skin: Negative for color change and worsening wound Neurological: Negative for tremors and numbness other than noted  Psychiatric/Behavioral: Negative for decreased concentration or agitation other than above       Objective:   Physical Exam BP 136/74   Pulse 79   Temp 98.7 F (37.1 C) (Oral)   Resp 20   Wt 219 lb (99.3 kg)   SpO2 94%   BMI 29.70 kg/m  VS noted, mild ill Constitutional: Pt appears in no apparent distress HENT: Head: NCAT.  Right Ear: External ear normal.  Left Ear: External ear normal.  Eyes: .  Pupils are equal, round, and reactive to light. Conjunctivae and EOM are normal Bilat tm's with mild erythema.  Max sinus areas mild tender.  Pharynx with mild erythema, no exudate Neck: Normal range of motion. Neck supple.  Cardiovascular: Normal rate and regular rhythm.   Pulmonary/Chest: Effort normal and breath sounds without rales or wheezing.  Neurological: Pt is alert. Not confused , motor grossly intact Skin: Skin is warm. No rash, no LE edema Psychiatric: Pt behavior is normal. No agitation.    Assessment & Plan:

## 2016-02-11 DIAGNOSIS — H1010 Acute atopic conjunctivitis, unspecified eye: Secondary | ICD-10-CM | POA: Insufficient documentation

## 2016-02-11 NOTE — Assessment & Plan Note (Signed)
Mod seasonal flare, for depomedrol IM, predpac asd, otc zyrtec and nasacort,   to f/u any worsening symptoms or concerns, also zaditor otc prn

## 2016-02-11 NOTE — Assessment & Plan Note (Signed)
stable overall by history and exam, recent data reviewed with pt, and pt to continue medical treatment as before,  to f/u any worsening symptoms or concerns BP Readings from Last 3 Encounters:  02/09/16 136/74  12/20/15 128/78  06/22/15 (!) 144/78

## 2016-02-11 NOTE — Assessment & Plan Note (Signed)
Mod seasonal flare, for depomedrol IM, predpac asd, otc zyrtec and nasacort,   to f/u any worsening symptoms or concerns

## 2016-06-25 ENCOUNTER — Ambulatory Visit (INDEPENDENT_AMBULATORY_CARE_PROVIDER_SITE_OTHER): Payer: Medicare HMO | Admitting: Internal Medicine

## 2016-06-25 ENCOUNTER — Encounter: Payer: Self-pay | Admitting: Internal Medicine

## 2016-06-25 VITALS — BP 142/84 | HR 57 | Temp 98.2°F | Resp 20 | Wt 228.0 lb

## 2016-06-25 DIAGNOSIS — D126 Benign neoplasm of colon, unspecified: Secondary | ICD-10-CM

## 2016-06-25 DIAGNOSIS — I10 Essential (primary) hypertension: Secondary | ICD-10-CM | POA: Diagnosis not present

## 2016-06-25 DIAGNOSIS — Z23 Encounter for immunization: Secondary | ICD-10-CM | POA: Diagnosis not present

## 2016-06-25 DIAGNOSIS — Z0000 Encounter for general adult medical examination without abnormal findings: Secondary | ICD-10-CM

## 2016-06-25 DIAGNOSIS — E538 Deficiency of other specified B group vitamins: Secondary | ICD-10-CM | POA: Diagnosis not present

## 2016-06-25 DIAGNOSIS — Z0001 Encounter for general adult medical examination with abnormal findings: Secondary | ICD-10-CM | POA: Diagnosis not present

## 2016-06-25 DIAGNOSIS — R0789 Other chest pain: Secondary | ICD-10-CM

## 2016-06-25 DIAGNOSIS — N320 Bladder-neck obstruction: Secondary | ICD-10-CM

## 2016-06-25 DIAGNOSIS — K635 Polyp of colon: Secondary | ICD-10-CM | POA: Insufficient documentation

## 2016-06-25 DIAGNOSIS — M5440 Lumbago with sciatica, unspecified side: Secondary | ICD-10-CM

## 2016-06-25 DIAGNOSIS — G8929 Other chronic pain: Secondary | ICD-10-CM

## 2016-06-25 DIAGNOSIS — E785 Hyperlipidemia, unspecified: Secondary | ICD-10-CM

## 2016-06-25 NOTE — Assessment & Plan Note (Signed)
Cont w/meds 

## 2016-06-25 NOTE — Assessment & Plan Note (Signed)
On B12 Risks associated with treatment noncompliance were discussed. Compliance was encouraged. 

## 2016-06-25 NOTE — Assessment & Plan Note (Addendum)
Here for medicare wellness/physical  Diet: heart healthy  Physical activity: not sedentary  Depression/mood screen: negative  Hearing: intact to whispered voice  Visual acuity: grossly normal w/glasses, performs annual eye exam  ADLs: capable  Fall risk: low to none  Home safety: good  Cognitive evaluation: intact to orientation, naming, recall and repetition  EOL planning: adv directives, full code/ I agree  I have personally reviewed and have noted  1. The patient's medical, surgical and social history  2. Their use of alcohol, tobacco or illicit drugs  3. Their current medications and supplements  4. The patient's functional ability including ADL's, fall risks, home safety risks and hearing or visual impairment.  5. Diet and physical activities  6. Evidence for depression or mood disorders 7. The roster of all physicians providing medical care to patient - is listed in the Snapshot section of the chart and reviewed today.    Today patient counseled on age appropriate routine health concerns for screening and prevention, each reviewed and up to date or declined. Immunizations reviewed and up to date or declined. Labs ordered and reviewed. Risk factors for depression reviewed and negative. Hearing function and visual acuity are intact. ADLs screened and addressed as needed. Functional ability and level of safety reviewed and appropriate. Education, counseling and referrals performed based on assessed risks today. Patient provided with a copy of personalized plan for preventive services.  Due colon - will schedule Pt declined lung ca screening

## 2016-06-25 NOTE — Progress Notes (Signed)
Pre visit review using our clinic review tool, if applicable. No additional management support is needed unless otherwise documented below in the visit note. 

## 2016-06-25 NOTE — Assessment & Plan Note (Signed)
L - gas pains EKG ok

## 2016-06-25 NOTE — Assessment & Plan Note (Addendum)
Back to work part time. Better.

## 2016-06-25 NOTE — Assessment & Plan Note (Signed)
Colon was due in 2017 per GI  Will refer

## 2016-06-25 NOTE — Progress Notes (Signed)
Subjective:  Patient ID: [name] [name], male    DOB: 1945/09/07  Age: 71 y.o. MRN: [id number]  CC: No chief complaint on file.   HPI COUGAR ENGE presents for a well exam F/u B12 def C/o CP w/"gas"  Outpatient Medications Prior to Visit  Medication Sig Dispense Refill  . Alcohol Swabs (ALCOHOL PREPS) PADS     . amLODipine (NORVASC) 10 MG tablet Take 1 tablet (10 mg total) by mouth daily. 90 tablet 3  . azelastine (OPTIVAR) 0.05 % ophthalmic solution Place 1 drop into both eyes 2 (two) times daily. 6 mL 12  . B Complex Vitamins (VITAMIN B COMPLEX) TABS Take 1 tablet by mouth daily.    . carvedilol (COREG) 25 MG tablet 1TAKE 1 TABLET (25 MG TOTAL) BY MOUTH 2 (TWO) TIMES DAILY WITH A MEAL. 180 tablet 3  . Cholecalciferol 1000 UNITS tablet     . ergocalciferol (VITAMIN D2) 50000 units capsule Take 1 capsule (50,000 Units total) by mouth once a week. 6 capsule 0  . loratadine (CLARITIN) 10 MG tablet Take 1 tablet (10 mg total) by mouth daily. 100 tablet 3  . Multiple Vitamins-Minerals (SENIOR MULTIVITAMIN PLUS) TABS Take 1 tablet by mouth daily.    . ranitidine (ZANTAC) 150 MG tablet Take 1 tablet (150 mg total) by mouth 2 (two) times daily. 60 tablet 3  . tadalafil (CIALIS) 20 MG tablet Take 1 tablet (20 mg total) by mouth daily as needed for erectile dysfunction. 30 tablet 3  . valsartan (DIOVAN) 320 MG tablet Take 1 tablet (320 mg total) by mouth daily. 90 tablet 3   No facility-administered medications prior to visit.     ROS Review of Systems  Constitutional: Negative for appetite change, fatigue and unexpected weight change.  HENT: Negative for congestion, nosebleeds, sneezing, sore throat and trouble swallowing.   Eyes: Negative for itching and visual disturbance.  Respiratory: Negative for cough.   Cardiovascular: Negative for chest pain, palpitations and leg swelling.  Gastrointestinal: Negative for abdominal distention, blood in stool, diarrhea and nausea.  Genitourinary:  Negative for frequency and hematuria.  Musculoskeletal: Positive for arthralgias and back pain. Negative for gait problem, joint swelling and neck pain.  Skin: Negative for rash.  Neurological: Negative for dizziness, tremors, speech difficulty and weakness.  Psychiatric/Behavioral: Negative for agitation, dysphoric mood and sleep disturbance. The patient is not nervous/anxious.     Objective:  BP (!) 142/84   Pulse (!) 57   Temp 98.2 F (36.8 C) (Oral)   Resp 20   Wt 228 lb (103.4 kg)   SpO2 97%   BMI 30.92 kg/m   BP Readings from Last 3 Encounters:  06/25/16 (!) 142/84  02/09/16 136/74  12/20/15 128/78    Wt Readings from Last 3 Encounters:  06/25/16 228 lb (103.4 kg)  02/09/16 219 lb (99.3 kg)  12/20/15 220 lb (99.8 kg)    Physical Exam  Constitutional: He is oriented to person, place, and time. He appears well-developed and well-nourished. No distress.  HENT:  Head: Normocephalic and atraumatic.  Right Ear: External ear normal.  Left Ear: External ear normal.  Nose: Nose normal.  Mouth/Throat: Oropharynx is clear and moist. No oropharyngeal exudate.  Eyes: Conjunctivae and EOM are normal. Pupils are equal, round, and reactive to light. Right eye exhibits no discharge. Left eye exhibits no discharge. No scleral icterus.  Neck: Normal range of motion. Neck supple. No JVD present. No tracheal deviation present. No thyromegaly present.  Cardiovascular: Normal  rate, regular rhythm, normal heart sounds and intact distal pulses.  Exam reveals no gallop and no friction rub.   No murmur heard. Pulmonary/Chest: Effort normal and breath sounds normal. No stridor. No respiratory distress. He has no wheezes. He has no rales. He exhibits no tenderness.  Abdominal: Soft. Bowel sounds are normal. He exhibits no distension and no mass. There is no tenderness. There is no rebound and no guarding.  Genitourinary: Rectum normal and penis normal. Rectal exam shows guaiac negative stool. No  penile tenderness.  Musculoskeletal: Normal range of motion. He exhibits no edema or tenderness.  Lymphadenopathy:    He has no cervical adenopathy.  Neurological: He is alert and oriented to person, place, and time. He has normal reflexes. No cranial nerve deficit. He exhibits normal muscle tone. Coordination normal.  Skin: Skin is warm and dry. No rash noted. He is not diaphoretic. No erythema. No pallor.  Psychiatric: He has a normal mood and affect. His behavior is normal. Judgment and thought content normal.  prostate exam - per GI   Procedure: EKG Indication: well exam; chest pain w/gas Impression: S. brady. No acute changes.   Lab Results  Component Value Date   WBC 7.8 06/23/2015   HGB 15.2 06/23/2015   HCT 45.6 06/23/2015   PLT 212.0 06/23/2015   GLUCOSE 95 06/23/2015   CHOL 167 06/23/2015   TRIG 83.0 06/23/2015   HDL 35.80 (L) 06/23/2015   LDLDIRECT 134.0 02/08/2012   LDLCALC 114 (H) 06/23/2015   ALT 16 06/23/2015   AST 16 06/23/2015   NA 139 06/23/2015   K 4.3 06/23/2015   CL 104 06/23/2015   CREATININE 0.83 06/23/2015   BUN 12 06/23/2015   CO2 28 06/23/2015   TSH 2.60 06/23/2015   PSA 0.86 06/23/2015   HGBA1C 5.6 05/08/2006    Dg Chest 2 View  Result Date: 05/30/2013 CLINICAL DATA:  Cough EXAM: CHEST  2 VIEW COMPARISON:  02/27/2012 FINDINGS: Bandlike lingula density compatible with atelectasis or scarring. Normal heart size. Stable prominent hilar vascularity. No edema pattern or pneumonia. No collapse or consolidation. Negative for effusion or pneumothorax. Trachea midline. IMPRESSION: Lingula atelectasis versus scarring. Otherwise stable exam without acute process Electronically Signed   By: Daryll Brod M.D.   On: 05/30/2013 11:20    Assessment & Plan:   There are no diagnoses linked to this encounter. I am having Mr. Betzer maintain his azelastine, loratadine, Cholecalciferol, Alcohol Preps, Vitamin B Complex, SENIOR MULTIVITAMIN PLUS, amLODipine,  valsartan, carvedilol, tadalafil, ranitidine, and ergocalciferol.  No orders of the defined types were placed in this encounter.    Follow-up: No Follow-up on file.  Walker Kehr, MD

## 2016-06-26 ENCOUNTER — Other Ambulatory Visit (INDEPENDENT_AMBULATORY_CARE_PROVIDER_SITE_OTHER): Payer: Medicare HMO

## 2016-06-26 DIAGNOSIS — E785 Hyperlipidemia, unspecified: Secondary | ICD-10-CM

## 2016-06-26 DIAGNOSIS — N320 Bladder-neck obstruction: Secondary | ICD-10-CM | POA: Diagnosis not present

## 2016-06-26 DIAGNOSIS — Z0000 Encounter for general adult medical examination without abnormal findings: Secondary | ICD-10-CM

## 2016-06-26 LAB — LIPID PANEL
CHOL/HDL RATIO: 4
CHOLESTEROL: 161 mg/dL (ref 0–200)
HDL: 40.4 mg/dL (ref >39.00)
LDL Cholesterol: 102 mg/dL — ABNORMAL HIGH (ref 0–99)
NonHDL: 120.33
TRIGLYCERIDES: 93 mg/dL (ref 0.0–149.0)
VLDL: 18.6 mg/dL (ref 0.0–40.0)

## 2016-06-26 LAB — CBC WITH DIFFERENTIAL/PLATELET
BASOS PCT: 0.6 % (ref 0.0–3.0)
Basophils Absolute: 0 10*3/uL (ref 0.0–0.1)
EOS ABS: 0.2 10*3/uL (ref 0.0–0.7)
Eosinophils Relative: 2 % (ref 0.0–5.0)
HEMATOCRIT: 43 % (ref 39.0–52.0)
Hemoglobin: 15 g/dL (ref 13.0–17.0)
Lymphocytes Relative: 27.7 % (ref 12.0–46.0)
Lymphs Abs: 2 10*3/uL (ref 0.7–4.0)
MCHC: 34.9 g/dL (ref 30.0–36.0)
MCV: 84.9 fl (ref 78.0–100.0)
MONO ABS: 0.8 10*3/uL (ref 0.1–1.0)
Monocytes Relative: 11.1 % (ref 3.0–12.0)
NEUTROS ABS: 4.3 10*3/uL (ref 1.4–7.7)
Neutrophils Relative %: 58.6 % (ref 43.0–77.0)
PLATELETS: 207 10*3/uL (ref 150.0–400.0)
RBC: 5.07 Mil/uL (ref 4.22–5.81)
RDW: 13.6 % (ref 11.5–15.5)
WBC: 7.4 10*3/uL (ref 4.0–10.5)

## 2016-06-26 LAB — BASIC METABOLIC PANEL
BUN: 15 mg/dL (ref 6–23)
CHLORIDE: 106 meq/L (ref 96–112)
CO2: 28 meq/L (ref 19–32)
CREATININE: 1.02 mg/dL (ref 0.40–1.50)
Calcium: 9.5 mg/dL (ref 8.4–10.5)
GFR: 93.09 mL/min (ref >60.00)
Glucose, Bld: 106 mg/dL — ABNORMAL HIGH (ref 70–99)
Potassium: 3.9 mEq/L (ref 3.5–5.1)
Sodium: 141 mEq/L (ref 135–145)

## 2016-06-26 LAB — URINALYSIS
BILIRUBIN URINE: NEGATIVE
Hgb urine dipstick: NEGATIVE
Ketones, ur: NEGATIVE
LEUKOCYTES UA: NEGATIVE
NITRITE: NEGATIVE
Specific Gravity, Urine: 1.02 (ref 1.000–1.030)
Total Protein, Urine: NEGATIVE
UROBILINOGEN UA: 0.2 (ref 0.0–1.0)
Urine Glucose: NEGATIVE
pH: 5.5 (ref 5.0–8.0)

## 2016-06-26 LAB — HEPATIC FUNCTION PANEL
ALT: 21 U/L (ref 0–53)
AST: 20 U/L (ref 0–37)
Albumin: 4.3 g/dL (ref 3.5–5.2)
Alkaline Phosphatase: 98 U/L (ref 39–117)
Bilirubin, Direct: 0.2 mg/dL (ref 0.0–0.3)
TOTAL PROTEIN: 7.2 g/dL (ref 6.0–8.3)
Total Bilirubin: 0.8 mg/dL (ref 0.2–1.2)

## 2016-06-26 LAB — PSA: PSA: 1 ng/mL (ref 0.10–4.00)

## 2016-06-26 LAB — TSH: TSH: 2.41 u[IU]/mL (ref 0.35–4.50)

## 2016-06-28 ENCOUNTER — Encounter: Payer: Self-pay | Admitting: Internal Medicine

## 2016-07-10 ENCOUNTER — Telehealth: Payer: Self-pay | Admitting: Internal Medicine

## 2016-07-10 NOTE — Telephone Encounter (Signed)
[facility]  Patient Name: [name] [name]  DOB: 12-Jul-1945    Initial Comment Caller's boyfriend's ankles are both very swollen, the left is worse than his right, the swelling is going up his leg, indentation remained for several minutes, both feet are swollen look blistered to her, his bunions are purple, skin on the left is super tight, both are purple & look scaly to her. Has appt on Thursday at 11:15a.   Nurse Assessment  Nurse: Levada Dy, RN, Mariann Laster Date/Time (Eastern Time): 07/10/2016 5:44:04 PM  Confirm and document reason for call. If symptomatic, describe symptoms. ---Caller states swelling both ankles , he has pitting edema and feet look purple.  Does the patient have any new or worsening symptoms? ---Yes  Will a triage be completed? ---Yes  Related visit to physician within the last 2 weeks? ---No  Does the PT have any chronic conditions? (i.e. diabetes, asthma, etc.) ---Yes  List chronic conditions. ---Hypertension,  Is this a behavioral health or substance abuse call? ---No     Guidelines    Guideline Title Affirmed Question Affirmed Notes  Leg Swelling and Edema [1] MODERATE leg swelling (e.g., swelling extends up to knees) AND [2] new onset or worsening    Final Disposition User   See Physician within 24 Hours Insell, RN, Mariann Laster    Referrals  REFERRED TO PCP OFFICE   Disagree/Comply: [name]

## 2016-07-11 ENCOUNTER — Encounter: Payer: Self-pay | Admitting: Internal Medicine

## 2016-07-11 ENCOUNTER — Ambulatory Visit (INDEPENDENT_AMBULATORY_CARE_PROVIDER_SITE_OTHER): Payer: Medicare HMO | Admitting: Internal Medicine

## 2016-07-11 ENCOUNTER — Other Ambulatory Visit (INDEPENDENT_AMBULATORY_CARE_PROVIDER_SITE_OTHER): Payer: Medicare HMO

## 2016-07-11 VITALS — BP 142/78 | HR 56 | Temp 98.1°F | Ht 72.0 in | Wt 226.0 lb

## 2016-07-11 DIAGNOSIS — I10 Essential (primary) hypertension: Secondary | ICD-10-CM | POA: Diagnosis not present

## 2016-07-11 DIAGNOSIS — M7989 Other specified soft tissue disorders: Secondary | ICD-10-CM

## 2016-07-11 LAB — BRAIN NATRIURETIC PEPTIDE: PRO B NATRI PEPTIDE: 39 pg/mL (ref 0.0–100.0)

## 2016-07-11 MED ORDER — FUROSEMIDE 40 MG PO TABS
40.0000 mg | ORAL_TABLET | Freq: Every day | ORAL | 3 refills | Status: DC
Start: 2016-07-11 — End: 2016-09-04

## 2016-07-11 NOTE — Progress Notes (Signed)
   Subjective:    Patient ID: [name] [name], male    DOB: 1945-11-14, 71 y.o.   MRN: [id number]  HPI The patient is a 71 YO man coming in for leg swelling. This is going on for several months off and on. He forgot to mention it when he was here last time. He has some swelling in his ankles both sides. They go down some when he elevates or after sleeping. He feels that this started after the change from brand azor or benicar to the medicines he is taking now. He denies SOB or chest pains. No abdominal problems. Recent physical with normal labs.   Review of Systems  Constitutional: Negative.   Respiratory: Negative.   Cardiovascular: Positive for leg swelling. Negative for chest pain and palpitations.  Gastrointestinal: Negative.   Musculoskeletal: Negative.   Neurological: Negative.       Objective:   Physical Exam  Constitutional: He is oriented to person, place, and time. He appears well-developed and well-nourished.  HENT:  Head: Normocephalic and atraumatic.  Eyes: EOM are normal.  Neck: Normal range of motion.  Cardiovascular: Normal rate and regular rhythm.   Pulmonary/Chest: Effort normal and breath sounds normal. No respiratory distress. He has no wheezes. He has no rales.  Abdominal: Soft. He exhibits no distension. There is no tenderness. There is no rebound.  Neurological: He is alert and oriented to person, place, and time. Coordination normal.  Skin: Skin is warm and dry.   Vitals:   07/11/16 0849  BP: (!) 142/78  Pulse: (!) 56  Temp: 98.1 F (36.7 C)  TempSrc: Oral  SpO2: 99%  Weight: 226 lb (102.5 kg)  Height: 6' (1.829 m)      Assessment & Plan:

## 2016-07-11 NOTE — Assessment & Plan Note (Signed)
Checking BNP to rule out heart as source. Rent CMP normal. Amlodipine could be the source of swelling and will stop and replace with lasix (prior bad reaction with hctz).

## 2016-07-11 NOTE — Patient Instructions (Addendum)
We will have you stop taking amlodipine.   We have added a fluid medicine called lasix (also called furosemide), take 1 pill daily.   We are checking the labs today.

## 2016-07-11 NOTE — Assessment & Plan Note (Signed)
Taking coreg only daily (recommend PCP switch to once daily beta blocker if they want to continue). Stop amlodipine due to lower extremity swelling. Add lasix daily and continue valsartan.

## 2016-07-11 NOTE — Progress Notes (Signed)
Pre visit review using our clinic review tool, if applicable. No additional management support is needed unless otherwise documented below in the visit note. 

## 2016-07-12 ENCOUNTER — Ambulatory Visit: Payer: Self-pay | Admitting: Internal Medicine

## 2016-08-14 ENCOUNTER — Encounter: Payer: Self-pay | Admitting: *Deleted

## 2016-08-14 ENCOUNTER — Ambulatory Visit (AMBULATORY_SURGERY_CENTER): Payer: Self-pay | Admitting: *Deleted

## 2016-08-14 VITALS — Ht 72.0 in | Wt 222.0 lb

## 2016-08-14 DIAGNOSIS — Z86010 Personal history of colonic polyps: Secondary | ICD-10-CM

## 2016-08-14 MED ORDER — NA SULFATE-K SULFATE-MG SULF 17.5-3.13-1.6 GM/177ML PO SOLN: 1.0000 | Freq: Once | ORAL | 0 refills | Status: AC

## 2016-08-14 NOTE — Progress Notes (Signed)
No egg or soy allergy known to patient  No issues with past sedation with any surgeries  or procedures, no intubation problems  No diet pills per patient No home 02 use per patient  No blood thinners per patient  Pt denies issues with constipation  No A fib or A flutter   emmi video declined

## 2016-08-24 ENCOUNTER — Other Ambulatory Visit: Payer: Self-pay | Admitting: Internal Medicine

## 2016-08-28 ENCOUNTER — Encounter: Payer: Self-pay | Admitting: Internal Medicine

## 2016-08-28 ENCOUNTER — Ambulatory Visit (AMBULATORY_SURGERY_CENTER): Payer: Medicare HMO | Admitting: Internal Medicine

## 2016-08-28 VITALS — BP 123/45 | HR 48 | Temp 97.5°F | Resp 12 | Ht 72.0 in | Wt 222.0 lb

## 2016-08-28 DIAGNOSIS — Z86010 Personal history of colonic polyps: Secondary | ICD-10-CM | POA: Diagnosis not present

## 2016-08-28 DIAGNOSIS — D127 Benign neoplasm of rectosigmoid junction: Secondary | ICD-10-CM

## 2016-08-28 DIAGNOSIS — K635 Polyp of colon: Secondary | ICD-10-CM | POA: Diagnosis not present

## 2016-08-28 DIAGNOSIS — D125 Benign neoplasm of sigmoid colon: Secondary | ICD-10-CM

## 2016-08-28 MED ORDER — SODIUM CHLORIDE 0.9 % IV SOLN: 500.0000 mL | INTRAVENOUS | Status: DC

## 2016-08-28 NOTE — Progress Notes (Signed)
Called to room to assist during endoscopic procedure.  Patient ID and intended procedure confirmed with present staff. Received instructions for my participation in the procedure from the performing physician.  

## 2016-08-28 NOTE — Progress Notes (Signed)
Pt's states no medical or surgical changes since previsit or office visit. 

## 2016-08-28 NOTE — Op Note (Signed)
[place] Patient Name: [name] [name] Procedure Date: 08/28/2016 10:09 AM MRN: [id number] Endoscopist: Docia Chuck. Henrene Pastor , MD Age: 71 Referring MD:  Date of Birth: February 28, 1946 Gender: Male Account #: [id number] Procedure:                Colonoscopy, with cold snare polypectomy x 2 Indications:              High risk colon cancer surveillance: Personal                            history of non-advanced adenoma. Index exam 2012 Medicines:                Monitored Anesthesia Care Procedure:                Pre-Anesthesia Assessment:                           - Prior to the procedure, a History and Physical                            was performed, and patient medications and                            allergies were reviewed. The patient's tolerance of                            previous anesthesia was also reviewed. The risks                            and benefits of the procedure and the sedation                            options and risks were discussed with the patient.                            All questions were answered, and informed consent                            was obtained. Prior Anticoagulants: The patient has                            taken no previous anticoagulant or antiplatelet                            agents. ASA Grade Assessment: II - A patient with                            mild systemic disease. After reviewing the risks                            and benefits, the patient was deemed in                            satisfactory condition to undergo the procedure.  After obtaining informed consent, the colonoscope                            was passed under direct vision. Throughout the                            procedure, the patient's blood pressure, pulse, and                            oxygen saturations were monitored continuously. The                            Colonoscope was introduced through the anus and              advanced to the the cecum, identified by                            appendiceal orifice and ileocecal valve. The                            ileocecal valve, appendiceal orifice, and rectum                            were photographed. The quality of the bowel                            preparation was excellent. The colonoscopy was                            performed without difficulty. The patient tolerated                            the procedure well. The bowel preparation used was                            SUPREP. Scope In: 10:16:17 AM Scope Out: 10:29:21 AM Scope Withdrawal Time: 0 hours 10 minutes 18 seconds  Total Procedure Duration: 0 hours 13 minutes 4 seconds  Findings:                 Two polyps were found in the recto-sigmoid colon.                            The polyps were 5 mm in size. These polyps were                            removed with a cold snare. Resection and retrieval                            were complete.                           Internal hemorrhoids were found during retroflexion.  The exam was otherwise without abnormality on                            direct and retroflexion views. Complications:            No immediate complications. Estimated blood loss:                            None. Estimated Blood Loss:     Estimated blood loss: none. Impression:               - Two 5 mm polyps at the recto-sigmoid colon,                            removed with a cold snare. Resected and retrieved.                           - Internal hemorrhoids.                           - The examination was otherwise normal on direct                            and retroflexion views. Recommendation:           - Repeat colonoscopy in 5-10 years for surveillance.                           - Patient has a contact number available for                            emergencies. The signs and symptoms of potential                             delayed complications were discussed with the                            patient. Return to normal activities tomorrow.                            Written discharge instructions were provided to the                            patient.                           - Resume previous diet.                           - Continue present medications.                           - Await pathology results. Docia Chuck. Henrene Pastor, MD 08/28/2016 10:33:03 AM This report has been signed electronically.

## 2016-08-28 NOTE — Patient Instructions (Signed)
YOU HAD AN ENDOSCOPIC PROCEDURE TODAY AT THE [facility]:   Refer to the procedure report that was given to you for any specific questions about what was found during the examination.  If the procedure report does not answer your questions, please call your gastroenterologist to clarify.  If you requested that your care partner not be given the details of your procedure findings, then the procedure report has been included in a sealed envelope for you to review at your convenience later.  YOU SHOULD EXPECT: Some feelings of bloating in the abdomen. Passage of more gas than usual.  Walking can help get rid of the air that was put into your GI tract during the procedure and reduce the bloating. If you had a lower endoscopy (such as a colonoscopy or flexible sigmoidoscopy) you may notice spotting of blood in your stool or on the toilet paper. If you underwent a bowel prep for your procedure, you may not have a normal bowel movement for a few days.  Please Note:  You might notice some irritation and congestion in your nose or some drainage.  This is from the oxygen used during your procedure.  There is no need for concern and it should clear up in a day or so.  SYMPTOMS TO REPORT IMMEDIATELY:   Following lower endoscopy (colonoscopy or flexible sigmoidoscopy):  Excessive amounts of blood in the stool  Significant tenderness or worsening of abdominal pains  Swelling of the abdomen that is new, acute  Fever of 100F or higher   For urgent or emergent issues, a gastroenterologist can be reached at any hour by calling [phone]. Please read all handouts given to you by your recovery nurse.   DIET:  We do recommend a small meal at first, but then you may proceed to your regular diet.  Drink plenty of fluids but you should avoid alcoholic beverages for 24 hours.  ACTIVITY:  You should plan to take it easy for the rest of today and you should NOT DRIVE or use heavy machinery until  tomorrow (because of the sedation medicines used during the test).    FOLLOW UP: Our staff will call the number listed on your records the next business day following your procedure to check on you and address any questions or concerns that you may have regarding the information given to you following your procedure. If we do not reach you, we will leave a message.  However, if you are feeling well and you are not experiencing any problems, there is no need to return our call.  We will assume that you have returned to your regular daily activities without incident.  If any biopsies were taken you will be contacted by phone or by letter within the next 1-3 weeks.  Please call us at [phone] if you have not heard about the biopsies in 3 weeks.    SIGNATURES/CONFIDENTIALITY: You and/or your care partner have signed paperwork which will be entered into your electronic medical record.  These signatures attest to the fact that that the information above on your After Visit Summary has been reviewed and is understood.  Full responsibility of the confidentiality of this discharge information lies with you and/or your care-partner.  Thank you for letting us take care of your healthcare needs today. 

## 2016-08-28 NOTE — Progress Notes (Signed)
Spontaneous respirations throughout. VSS. Resting comfortably. To PACU on room air. Report to  Sara RN. 

## 2016-08-29 ENCOUNTER — Telehealth: Payer: Self-pay | Admitting: *Deleted

## 2016-08-29 NOTE — Telephone Encounter (Signed)
  Follow up Call-  Call back number 08/28/2016  Post procedure Call Back phone  # [phone]  Permission to leave phone message Yes  Some recent data might be hidden     Patient questions:  Do you have a fever, pain , or abdominal swelling? No. Pain Score  0 *  Have you tolerated food without any problems? Yes.    Have you been able to return to your normal activities? Yes.    Do you have any questions about your discharge instructions: Diet   No. Medications  No. Follow up visit  No.  Do you have questions or concerns about your Care? No.  Actions: * If pain score is 4 or above: No action needed, pain <4.

## 2016-09-04 ENCOUNTER — Other Ambulatory Visit: Payer: Self-pay | Admitting: *Deleted

## 2016-09-04 ENCOUNTER — Encounter: Payer: Self-pay | Admitting: Internal Medicine

## 2016-09-04 MED ORDER — FUROSEMIDE 40 MG PO TABS: 40.0000 mg | ORAL_TABLET | Freq: Every day | ORAL | 2 refills | Status: DC

## 2016-09-04 MED ORDER — CARVEDILOL 25 MG PO TABS: ORAL_TABLET | ORAL | 2 refills | Status: DC

## 2016-09-04 MED ORDER — VALSARTAN 320 MG PO TABS: 320.0000 mg | ORAL_TABLET | Freq: Every day | ORAL | 2 refills | Status: DC

## 2016-09-07 ENCOUNTER — Telehealth: Payer: Self-pay | Admitting: *Deleted

## 2016-09-07 NOTE — Telephone Encounter (Signed)
Rec'd another fax for refills on pt furosemide, Valsartan and Carvedilol. Refills already been sent on 4/18 to [facility]...lmb

## 2016-11-30 ENCOUNTER — Encounter: Payer: Self-pay | Admitting: Internal Medicine

## 2016-11-30 ENCOUNTER — Ambulatory Visit (INDEPENDENT_AMBULATORY_CARE_PROVIDER_SITE_OTHER): Payer: Medicare HMO | Admitting: Internal Medicine

## 2016-11-30 DIAGNOSIS — I10 Essential (primary) hypertension: Secondary | ICD-10-CM

## 2016-11-30 DIAGNOSIS — J4520 Mild intermittent asthma, uncomplicated: Secondary | ICD-10-CM

## 2016-11-30 DIAGNOSIS — J301 Allergic rhinitis due to pollen: Secondary | ICD-10-CM

## 2016-11-30 MED ORDER — AZITHROMYCIN 250 MG PO TABS: ORAL_TABLET | ORAL | 0 refills | Status: DC

## 2016-11-30 MED ORDER — LEVOCETIRIZINE DIHYDROCHLORIDE 5 MG PO TABS: 5.0000 mg | ORAL_TABLET | Freq: Every evening | ORAL | 3 refills | Status: DC

## 2016-11-30 MED ORDER — BENZONATATE 200 MG PO CAPS: 200.0000 mg | ORAL_CAPSULE | Freq: Two times a day (BID) | ORAL | 0 refills | Status: DC | PRN

## 2016-11-30 MED ORDER — PROMETHAZINE-CODEINE 6.25-10 MG/5ML PO SYRP: 5.0000 mL | ORAL_SOLUTION | ORAL | 0 refills | Status: DC | PRN

## 2016-11-30 NOTE — Assessment & Plan Note (Signed)
Xyzal prn Nasocort prn

## 2016-11-30 NOTE — Assessment & Plan Note (Signed)
Pls take BP meds this am

## 2016-11-30 NOTE — Assessment & Plan Note (Addendum)
Zpqc Prom-cod syr - do not take when driving Pt declined CXR

## 2016-11-30 NOTE — Progress Notes (Signed)
Subjective:  Patient ID: [name] [name], male    DOB: April 26, 1946  Age: 71 y.o. MRN: [id number]  CC: No chief complaint on file.   HPI ARNEY MAYABB presents for allergies clear d/c. C/o dryness No sneezing. C/o severe cough x 2 d  Outpatient Medications Prior to Visit  Medication Sig Dispense Refill  . Alcohol Swabs (ALCOHOL PREPS) PADS     . amLODipine (NORVASC) 10 MG tablet Take 1 tablet (10 mg total) by mouth daily. 90 tablet 3  . azelastine (OPTIVAR) 0.05 % ophthalmic solution Place 1 drop into both eyes 2 (two) times daily. 6 mL 12  . B Complex Vitamins (VITAMIN B COMPLEX) TABS Take 1 tablet by mouth daily.    . carvedilol (COREG) 25 MG tablet 1TAKE 1 TABLET (25 MG TOTAL) BY MOUTH 2 (TWO) TIMES DAILY WITH A MEAL. 180 tablet 2  . Cholecalciferol 1000 UNITS tablet     . ergocalciferol (VITAMIN D2) 50000 units capsule Take 1 capsule (50,000 Units total) by mouth once a week. 6 capsule 0  . furosemide (LASIX) 40 MG tablet Take 1 tablet (40 mg total) by mouth daily. 90 tablet 2  . loratadine (CLARITIN) 10 MG tablet Take 1 tablet (10 mg total) by mouth daily. 100 tablet 3  . Multiple Vitamins-Minerals (SENIOR MULTIVITAMIN PLUS) TABS Take 1 tablet by mouth daily.    . tadalafil (CIALIS) 20 MG tablet Take 1 tablet (20 mg total) by mouth daily as needed for erectile dysfunction. 30 tablet 3  . valsartan (DIOVAN) 320 MG tablet Take 1 tablet (320 mg total) by mouth daily. 90 tablet 2   Facility-Administered Medications Prior to Visit  Medication Dose Route Frequency Provider Last Rate Last Dose  . 0.9 %  sodium chloride infusion  500 mL Intravenous Continuous Irene Shipper, MD        ROS Review of Systems  Constitutional: Negative for appetite change, fatigue and unexpected weight change.  HENT: Positive for congestion, postnasal drip, rhinorrhea and sinus pressure. Negative for nosebleeds, sneezing, sore throat and trouble swallowing.   Eyes: Negative for itching and visual disturbance.   Respiratory: Positive for cough. Negative for wheezing.   Cardiovascular: Negative for chest pain, palpitations and leg swelling.  Gastrointestinal: Negative for abdominal distention, blood in stool, diarrhea and nausea.  Genitourinary: Negative for frequency and hematuria.  Musculoskeletal: Negative for back pain, gait problem, joint swelling and neck pain.  Skin: Negative for rash.  Neurological: Negative for dizziness, tremors, speech difficulty and weakness.  Psychiatric/Behavioral: Negative for agitation, dysphoric mood and sleep disturbance. The patient is not nervous/anxious.     Objective:  BP (!) 142/82 (BP Location: Left Arm, Patient Position: Sitting, Cuff Size: Large)   Pulse (!) 51   Temp 98.7 F (37.1 C) (Oral)   Ht 6' (1.829 m)   Wt 217 lb (98.4 kg)   SpO2 98%   BMI 29.43 kg/m   BP Readings from Last 3 Encounters:  11/30/16 (!) 142/82  08/28/16 (!) 123/45  07/11/16 (!) 142/78    Wt Readings from Last 3 Encounters:  11/30/16 217 lb (98.4 kg)  08/28/16 222 lb (100.7 kg)  08/14/16 222 lb (100.7 kg)    Physical Exam  Constitutional: He is oriented to person, place, and time. He appears well-developed. No distress.  NAD  HENT:  Mouth/Throat: Oropharynx is clear and moist.  Eyes: Pupils are equal, round, and reactive to light. Conjunctivae are normal.  Neck: Normal range of motion. No JVD present.  No thyromegaly present.  Cardiovascular: Normal rate, regular rhythm, normal heart sounds and intact distal pulses.  Exam reveals no gallop and no friction rub.   No murmur heard. Pulmonary/Chest: Effort normal and breath sounds normal. No respiratory distress. He has no wheezes. He has no rales. He exhibits no tenderness.  Abdominal: Soft. Bowel sounds are normal. He exhibits no distension and no mass. There is no tenderness. There is no rebound and no guarding.  Musculoskeletal: Normal range of motion. He exhibits no edema or tenderness.  Lymphadenopathy:    He has  no cervical adenopathy.  Neurological: He is alert and oriented to person, place, and time. He has normal reflexes. No cranial nerve deficit. He exhibits normal muscle tone. He displays a negative Romberg sign. Coordination and gait normal.  Skin: Skin is warm and dry. No rash noted.  Psychiatric: He has a normal mood and affect. His behavior is normal. Judgment and thought content normal.    Lab Results  Component Value Date   WBC 7.4 06/26/2016   HGB 15.0 06/26/2016   HCT 43.0 06/26/2016   PLT 207.0 06/26/2016   GLUCOSE 106 (H) 06/26/2016   CHOL 161 06/26/2016   TRIG 93.0 06/26/2016   HDL 40.40 06/26/2016   LDLDIRECT 134.0 02/08/2012   LDLCALC 102 (H) 06/26/2016   ALT 21 06/26/2016   AST 20 06/26/2016   NA 141 06/26/2016   K 3.9 06/26/2016   CL 106 06/26/2016   CREATININE 1.02 06/26/2016   BUN 15 06/26/2016   CO2 28 06/26/2016   TSH 2.41 06/26/2016   PSA 1.00 06/26/2016   HGBA1C 5.6 05/08/2006    Dg Chest 2 View  Result Date: 05/30/2013 CLINICAL DATA:  Cough EXAM: CHEST  2 VIEW COMPARISON:  02/27/2012 FINDINGS: Bandlike lingula density compatible with atelectasis or scarring. Normal heart size. Stable prominent hilar vascularity. No edema pattern or pneumonia. No collapse or consolidation. Negative for effusion or pneumothorax. Trachea midline. IMPRESSION: Lingula atelectasis versus scarring. Otherwise stable exam without acute process Electronically Signed   By: Daryll Brod M.D.   On: 05/30/2013 11:20    Assessment & Plan:   There are no diagnoses linked to this encounter. I am having Mr. Derasmo maintain his azelastine, loratadine, Cholecalciferol, Alcohol Preps, Vitamin B Complex, SENIOR MULTIVITAMIN PLUS, amLODipine, tadalafil, ergocalciferol, furosemide, carvedilol, and valsartan. We will continue to administer sodium chloride.  No orders of the defined types were placed in this encounter.    Follow-up: No Follow-up on file.  Walker Kehr, MD

## 2016-12-26 ENCOUNTER — Ambulatory Visit: Payer: Self-pay | Admitting: Internal Medicine

## 2016-12-26 ENCOUNTER — Ambulatory Visit (INDEPENDENT_AMBULATORY_CARE_PROVIDER_SITE_OTHER): Payer: Medicare HMO | Admitting: Internal Medicine

## 2016-12-26 ENCOUNTER — Encounter: Payer: Self-pay | Admitting: Internal Medicine

## 2016-12-26 VITALS — BP 146/88 | HR 55 | Temp 97.9°F | Ht 72.0 in | Wt 224.0 lb

## 2016-12-26 DIAGNOSIS — M5440 Lumbago with sciatica, unspecified side: Secondary | ICD-10-CM | POA: Diagnosis not present

## 2016-12-26 DIAGNOSIS — Z0000 Encounter for general adult medical examination without abnormal findings: Secondary | ICD-10-CM | POA: Diagnosis not present

## 2016-12-26 DIAGNOSIS — R0789 Other chest pain: Secondary | ICD-10-CM | POA: Diagnosis not present

## 2016-12-26 DIAGNOSIS — E538 Deficiency of other specified B group vitamins: Secondary | ICD-10-CM

## 2016-12-26 DIAGNOSIS — G8929 Other chronic pain: Secondary | ICD-10-CM

## 2016-12-26 DIAGNOSIS — I10 Essential (primary) hypertension: Secondary | ICD-10-CM | POA: Diagnosis not present

## 2016-12-26 DIAGNOSIS — E559 Vitamin D deficiency, unspecified: Secondary | ICD-10-CM | POA: Diagnosis not present

## 2016-12-26 MED ORDER — RANITIDINE HCL 300 MG PO TABS: 300.0000 mg | ORAL_TABLET | Freq: Every day | ORAL | 11 refills | Status: DC

## 2016-12-26 NOTE — Assessment & Plan Note (Signed)
Treat GERD The pt declined a stress test

## 2016-12-26 NOTE — Assessment & Plan Note (Signed)
Doing well 

## 2016-12-26 NOTE — Assessment & Plan Note (Signed)
On B12 

## 2016-12-26 NOTE — Progress Notes (Signed)
Subjective:  Patient ID: [name] [name], male    DOB: [birth year]  Age: 71 y.o. MRN: [id number]  CC: No chief complaint on file.   HPI BENZION MESTA presents for HTN f/u C/o CPs at times w/? Foods; no exertional sxs He had a near fainting spell after eating pizza on Sat, threw up. C/o GERD. No lactose intolerance...  Outpatient Medications Prior to Visit  Medication Sig Dispense Refill  . amLODipine (NORVASC) 10 MG tablet Take 1 tablet (10 mg total) by mouth daily. 90 tablet 3  . B Complex Vitamins (VITAMIN B COMPLEX) TABS Take 1 tablet by mouth daily.    . carvedilol (COREG) 25 MG tablet 1TAKE 1 TABLET (25 MG TOTAL) BY MOUTH 2 (TWO) TIMES DAILY WITH A MEAL. 180 tablet 2  . Cholecalciferol 1000 UNITS tablet     . ergocalciferol (VITAMIN D2) 50000 units capsule Take 1 capsule (50,000 Units total) by mouth once a week. 6 capsule 0  . furosemide (LASIX) 40 MG tablet Take 1 tablet (40 mg total) by mouth daily. 90 tablet 2  . levocetirizine (XYZAL) 5 MG tablet Take 1 tablet (5 mg total) by mouth every evening. 30 tablet 3  . Multiple Vitamins-Minerals (SENIOR MULTIVITAMIN PLUS) TABS Take 1 tablet by mouth daily.    . promethazine-codeine (PHENERGAN WITH CODEINE) 6.25-10 MG/5ML syrup Take 5 mLs by mouth every 4 (four) hours as needed. 300 mL 0  . valsartan (DIOVAN) 320 MG tablet Take 1 tablet (320 mg total) by mouth daily. 90 tablet 2  . Alcohol Swabs (ALCOHOL PREPS) PADS     . azelastine (OPTIVAR) 0.05 % ophthalmic solution Place 1 drop into both eyes 2 (two) times daily. (Patient not taking: Reported on 12/26/2016) 6 mL 12  . azithromycin (ZITHROMAX Z-PAK) 250 MG tablet As directed (Patient not taking: Reported on 12/26/2016) 6 each 0  . benzonatate (TESSALON) 200 MG capsule Take 1 capsule (200 mg total) by mouth 2 (two) times daily as needed for cough. (Patient not taking: Reported on 12/26/2016) 60 capsule 0  . tadalafil (CIALIS) 20 MG tablet Take 1 tablet (20 mg total) by mouth daily as needed  for erectile dysfunction. (Patient not taking: Reported on 12/26/2016) 30 tablet 3   Facility-Administered Medications Prior to Visit  Medication Dose Route Frequency Provider Last Rate Last Dose  . 0.9 %  sodium chloride infusion  500 mL Intravenous Continuous Irene Shipper, MD        ROS Review of Systems  Constitutional: Negative for appetite change, fatigue and unexpected weight change.  HENT: Negative for congestion, nosebleeds, sneezing, sore throat and trouble swallowing.   Eyes: Negative for itching and visual disturbance.  Respiratory: Negative for cough.   Cardiovascular: Positive for chest pain. Negative for palpitations and leg swelling.  Gastrointestinal: Negative for abdominal distention, blood in stool, diarrhea and nausea.  Genitourinary: Negative for frequency and hematuria.  Musculoskeletal: Negative for back pain, gait problem, joint swelling and neck pain.  Skin: Negative for rash.  Neurological: Negative for dizziness, tremors, speech difficulty and weakness.  Psychiatric/Behavioral: Negative for agitation, dysphoric mood and sleep disturbance. The patient is not nervous/anxious.     Objective:  BP (!) 146/88 (BP Location: Left Arm, Patient Position: Sitting, Cuff Size: Large)   Pulse (!) 55   Temp 97.9 F (36.6 C) (Oral)   Ht 6' (1.829 m)   Wt 224 lb (101.6 kg)   SpO2 100%   BMI 30.38 kg/m   BP Readings from Last  3 Encounters:  12/26/16 (!) 146/88  11/30/16 (!) 142/82  08/28/16 (!) 123/45    Wt Readings from Last 3 Encounters:  12/26/16 224 lb (101.6 kg)  11/30/16 217 lb (98.4 kg)  08/28/16 222 lb (100.7 kg)    Physical Exam  Constitutional: He is oriented to person, place, and time. He appears well-developed. No distress.  NAD  HENT:  Mouth/Throat: Oropharynx is clear and moist.  Eyes: Pupils are equal, round, and reactive to light. Conjunctivae are normal.  Neck: Normal range of motion. No JVD present. No thyromegaly present.  Cardiovascular:  Normal rate, regular rhythm, normal heart sounds and intact distal pulses.  Exam reveals no gallop and no friction rub.   No murmur heard. Pulmonary/Chest: Effort normal and breath sounds normal. No respiratory distress. He has no wheezes. He has no rales. He exhibits no tenderness.  Abdominal: Soft. Bowel sounds are normal. He exhibits no distension and no mass. There is no tenderness. There is no rebound and no guarding.  Musculoskeletal: Normal range of motion. He exhibits no edema or tenderness.  Lymphadenopathy:    He has no cervical adenopathy.  Neurological: He is alert and oriented to person, place, and time. He has normal reflexes. No cranial nerve deficit. He exhibits normal muscle tone. He displays a negative Romberg sign. Coordination and gait normal.  Skin: Skin is warm and dry. No rash noted.  Psychiatric: He has a normal mood and affect. His behavior is normal. Judgment and thought content normal.    Lab Results  Component Value Date   WBC 7.4 06/26/2016   HGB 15.0 06/26/2016   HCT 43.0 06/26/2016   PLT 207.0 06/26/2016   GLUCOSE 106 (H) 06/26/2016   CHOL 161 06/26/2016   TRIG 93.0 06/26/2016   HDL 40.40 06/26/2016   LDLDIRECT 134.0 02/08/2012   LDLCALC 102 (H) 06/26/2016   ALT 21 06/26/2016   AST 20 06/26/2016   NA 141 06/26/2016   K 3.9 06/26/2016   CL 106 06/26/2016   CREATININE 1.02 06/26/2016   BUN 15 06/26/2016   CO2 28 06/26/2016   TSH 2.41 06/26/2016   PSA 1.00 06/26/2016   HGBA1C 5.6 05/08/2006    Dg Chest 2 View  Result Date: 05/30/2013 CLINICAL DATA:  Cough EXAM: CHEST  2 VIEW COMPARISON:  02/27/2012 FINDINGS: Bandlike lingula density compatible with atelectasis or scarring. Normal heart size. Stable prominent hilar vascularity. No edema pattern or pneumonia. No collapse or consolidation. Negative for effusion or pneumothorax. Trachea midline. IMPRESSION: Lingula atelectasis versus scarring. Otherwise stable exam without acute process Electronically  Signed   By: Daryll Brod M.D.   On: 05/30/2013 11:20    Assessment & Plan:   There are no diagnoses linked to this encounter. I have discontinued Mr. Stoudt azelastine, Alcohol Preps, tadalafil, azithromycin, and benzonatate. I am also having him maintain his Cholecalciferol, Vitamin B Complex, SENIOR MULTIVITAMIN PLUS, amLODipine, ergocalciferol, furosemide, carvedilol, valsartan, promethazine-codeine, and levocetirizine. We will continue to administer sodium chloride.  No orders of the defined types were placed in this encounter.    Follow-up: No Follow-up on file.  Walker Kehr, MD

## 2016-12-26 NOTE — Assessment & Plan Note (Signed)
He did not take meds today Cont w/Coreg, Diovan, Amlodipine

## 2017-01-08 ENCOUNTER — Ambulatory Visit (INDEPENDENT_AMBULATORY_CARE_PROVIDER_SITE_OTHER): Payer: Medicare HMO | Admitting: Nurse Practitioner

## 2017-01-08 ENCOUNTER — Encounter: Payer: Self-pay | Admitting: Nurse Practitioner

## 2017-01-08 VITALS — BP 144/86 | HR 46 | Temp 97.7°F | Ht 72.0 in | Wt 219.0 lb

## 2017-01-08 DIAGNOSIS — I10 Essential (primary) hypertension: Secondary | ICD-10-CM

## 2017-01-08 DIAGNOSIS — R51 Headache: Secondary | ICD-10-CM | POA: Diagnosis not present

## 2017-01-08 DIAGNOSIS — R519 Headache, unspecified: Secondary | ICD-10-CM

## 2017-01-08 MED ORDER — FLUTICASONE PROPIONATE 50 MCG/ACT NA SUSP: 2.0000 | Freq: Every day | NASAL | 0 refills | Status: DC

## 2017-01-08 MED ORDER — OLMESARTAN MEDOXOMIL 40 MG PO TABS: 40.0000 mg | ORAL_TABLET | Freq: Every day | ORAL | 1 refills | Status: DC

## 2017-01-08 MED ORDER — PREDNISONE 10 MG (21) PO TBPK: ORAL_TABLET | ORAL | 0 refills | Status: DC

## 2017-01-08 NOTE — Patient Instructions (Addendum)
Go to basement for labs as ordered by Dr. Alain Marion.  Complete oral prednisone for sinus congestion and headache prior to starting flonase.   Sinus Headache A sinus headache happens when your sinuses become clogged or swollen. You may feel pain or pressure in your face, forehead, ears, or upper teeth. Sinus headaches can be mild or severe. Follow these instructions at home:  Take medicines only as told by your doctor.  If you were given an antibiotic medicine, finish all of it even if you start to feel better.  Use a nose spray if you feel stuffed up (congested).  If told, apply a warm, moist washcloth to your face to help lessen pain. Contact a doctor if:  You get headaches more than one time each week.  Light or sound bothers you.  You have a fever.  You feel sick to your stomach (nauseous) or you throw up (vomit).  Your headaches do not get better with treatment. Get help right away if:  You have trouble seeing.  You suddenly have very bad pain in your face or head.  You start to twitch or shake (seizure).  You are confused.  You have a stiff neck. This information is not intended to replace advice given to you by your health care provider. Make sure you discuss any questions you have with your health care provider. Document Released: 09/06/2010 Document Revised: 01/01/2016 Document Reviewed: 05/03/2014 Elsevier Interactive Patient Education  [employer].

## 2017-01-08 NOTE — Progress Notes (Signed)
Subjective:  Patient ID: [name] [name], male    DOB: February 13, 1946  Age: 71 y.o. MRN: [id number]  CC: Follow-up (med consult--having headache and eyes hurt.)   Headache   This is a new problem. The current episode started 1 to 4 weeks ago. The problem occurs intermittently. The problem has been unchanged. The pain is located in the frontal region. The pain does not radiate. The pain quality is not similar to prior headaches. The quality of the pain is described as dull and aching. Associated symptoms include drainage, eye pain and sinus pressure. Pertinent negatives include no abnormal behavior, anorexia, blurred vision, coughing, dizziness, eye redness, eye watering, fever, hearing loss, insomnia, loss of balance, muscle aches, numbness, photophobia, rhinorrhea, scalp tenderness, seizures, sore throat, tinnitus, visual change, vomiting or weakness. The symptoms are aggravated by unknown. He has tried nothing for the symptoms. His past medical history is significant for hypertension and sinus disease. There is no history of immunosuppression, migraine headaches or recent head traumas.   Headache and eye pain x 41month. Feels foggy. No change in gait. Chronic sinus congestion, no OTC medication used. No change in vision. No weakness, no paresthesia.  HTN: States color and shape of valsartan prescription was changed 55month ago, hence he was wondering if that was cause of above symptoms. Needs valsartan changed due to recall. Has not been taking amlodipine as prescribed. He states he does not want to resume medication at this time unless recommended by pcp. BP Readings from Last 3 Encounters:  01/08/17 (!) 144/86  12/26/16 (!) 146/88  11/30/16 (!) 142/82    Outpatient Medications Prior to Visit  Medication Sig Dispense Refill  . B Complex Vitamins (VITAMIN B COMPLEX) TABS Take 1 tablet by mouth daily.    . carvedilol (COREG) 25 MG tablet 1TAKE 1 TABLET (25 MG TOTAL) BY MOUTH 2 (TWO) TIMES  DAILY WITH A MEAL. 180 tablet 2  . furosemide (LASIX) 40 MG tablet Take 1 tablet (40 mg total) by mouth daily. 90 tablet 2  . valsartan (DIOVAN) 320 MG tablet Take 1 tablet (320 mg total) by mouth daily. 90 tablet 2  . amLODipine (NORVASC) 10 MG tablet Take 1 tablet (10 mg total) by mouth daily. (Patient not taking: Reported on 01/08/2017) 90 tablet 3  . Cholecalciferol 1000 UNITS tablet     . ergocalciferol (VITAMIN D2) 50000 units capsule Take 1 capsule (50,000 Units total) by mouth once a week. (Patient not taking: Reported on 01/08/2017) 6 capsule 0  . levocetirizine (XYZAL) 5 MG tablet Take 1 tablet (5 mg total) by mouth every evening. (Patient not taking: Reported on 01/08/2017) 30 tablet 3  . Multiple Vitamins-Minerals (SENIOR MULTIVITAMIN PLUS) TABS Take 1 tablet by mouth daily.    . ranitidine (ZANTAC) 300 MG tablet Take 1 tablet (300 mg total) by mouth at bedtime. (Patient not taking: Reported on 01/08/2017) 30 tablet 11   Facility-Administered Medications Prior to Visit  Medication Dose Route Frequency Provider Last Rate Last Dose  . 0.9 %  sodium chloride infusion  500 mL Intravenous Continuous Irene Shipper, MD        ROS See HPI  Objective:  BP (!) 144/86   Pulse (!) 46   Temp 97.7 F (36.5 C)   Ht 6' (1.829 m)   Wt 219 lb (99.3 kg)   SpO2 98%   BMI 29.70 kg/m   BP Readings from Last 3 Encounters:  01/08/17 (!) 144/86  12/26/16 (!) 146/88  11/30/16 Marland Kitchen)  142/82    Wt Readings from Last 3 Encounters:  01/08/17 219 lb (99.3 kg)  12/26/16 224 lb (101.6 kg)  11/30/16 217 lb (98.4 kg)    Physical Exam  Constitutional: He is oriented to person, place, and time. No distress.  HENT:  Right Ear: Tympanic membrane, external ear and ear canal normal.  Left Ear: Tympanic membrane and ear canal normal.  Nose: Mucosal edema and rhinorrhea present. Right sinus exhibits no maxillary sinus tenderness and no frontal sinus tenderness. Left sinus exhibits no maxillary sinus  tenderness and no frontal sinus tenderness.  Mouth/Throat: Uvula is midline. No oropharyngeal exudate or posterior oropharyngeal erythema.  Eyes: Pupils are equal, round, and reactive to light. Conjunctivae and EOM are normal. No scleral icterus.  Neck: Normal range of motion. Neck supple.  Cardiovascular: Normal rate, regular rhythm and normal heart sounds.   Pulmonary/Chest: Effort normal and breath sounds normal.  Musculoskeletal: Normal range of motion.  Trace LE edema  Lymphadenopathy:    He has no cervical adenopathy.  Neurological: He is alert and oriented to person, place, and time. No cranial nerve deficit. Coordination normal.  Skin: Skin is warm and dry. No erythema.  Psychiatric: He has a normal mood and affect. His behavior is normal. Thought content normal.  Vitals reviewed.   Lab Results  Component Value Date   WBC 7.4 06/26/2016   HGB 15.0 06/26/2016   HCT 43.0 06/26/2016   PLT 207.0 06/26/2016   GLUCOSE 106 (H) 06/26/2016   CHOL 161 06/26/2016   TRIG 93.0 06/26/2016   HDL 40.40 06/26/2016   LDLDIRECT 134.0 02/08/2012   LDLCALC 102 (H) 06/26/2016   ALT 21 06/26/2016   AST 20 06/26/2016   NA 141 06/26/2016   K 3.9 06/26/2016   CL 106 06/26/2016   CREATININE 1.02 06/26/2016   BUN 15 06/26/2016   CO2 28 06/26/2016   TSH 2.41 06/26/2016   PSA 1.00 06/26/2016   HGBA1C 5.6 05/08/2006    Dg Chest 2 View  Result Date: 05/30/2013 CLINICAL DATA:  Cough EXAM: CHEST  2 VIEW COMPARISON:  02/27/2012 FINDINGS: Bandlike lingula density compatible with atelectasis or scarring. Normal heart size. Stable prominent hilar vascularity. No edema pattern or pneumonia. No collapse or consolidation. Negative for effusion or pneumothorax. Trachea midline. IMPRESSION: Lingula atelectasis versus scarring. Otherwise stable exam without acute process Electronically Signed   By: Daryll Brod M.D.   On: 05/30/2013 11:20    Assessment & Plan:   [name] [name] was seen today for  follow-up.  Diagnoses and all orders for this visit:  Essential hypertension -     Discontinue: olmesartan (BENICAR) 40 MG tablet; Take 1 tablet (40 mg total) by mouth daily. -     olmesartan (BENICAR) 40 MG tablet; Take 1 tablet (40 mg total) by mouth daily.  Sinus headache -     fluticasone (FLONASE) 50 MCG/ACT nasal spray; Place 2 sprays into both nostrils daily. -     predniSONE (STERAPRED UNI-PAK 21 TAB) 10 MG (21) TBPK tablet; As directed   I have discontinued Mr. Lague's valsartan. I am also having him start on fluticasone and predniSONE. Additionally, I am having him maintain his Cholecalciferol, Vitamin B Complex, SENIOR MULTIVITAMIN PLUS, amLODipine, ergocalciferol, furosemide, carvedilol, levocetirizine, ranitidine, and olmesartan. We will continue to administer sodium chloride.  Meds ordered this encounter  Medications  . fluticasone (FLONASE) 50 MCG/ACT nasal spray    Sig: Place 2 sprays into both nostrils daily.    Dispense:  16 g  Refill:  0    Order Specific Question:   Supervising Provider    Answer:   Cassandria Anger [[id number]]  . predniSONE (STERAPRED UNI-PAK 21 TAB) 10 MG (21) TBPK tablet    Sig: As directed    Dispense:  21 tablet    Refill:  0    Order Specific Question:   Supervising Provider    Answer:   Cassandria Anger [[id number]]  . DISCONTD: olmesartan (BENICAR) 40 MG tablet    Sig: Take 1 tablet (40 mg total) by mouth daily.    Dispense:  90 tablet    Refill:  1    Order Specific Question:   Supervising Provider    Answer:   Cassandria Anger [[id number]]  . olmesartan (BENICAR) 40 MG tablet    Sig: Take 1 tablet (40 mg total) by mouth daily.    Dispense:  90 tablet    Refill:  1    Order Specific Question:   Supervising Provider    Answer:   Cassandria Anger [[id number]]    Follow-up: Return in about 6 months (around 07/11/2017) for with Dr. Alain Marion.  Wilfred Lacy, NP

## 2017-03-01 ENCOUNTER — Ambulatory Visit (INDEPENDENT_AMBULATORY_CARE_PROVIDER_SITE_OTHER): Payer: Medicare HMO | Admitting: Internal Medicine

## 2017-03-01 ENCOUNTER — Other Ambulatory Visit (INDEPENDENT_AMBULATORY_CARE_PROVIDER_SITE_OTHER): Payer: Medicare HMO

## 2017-03-01 ENCOUNTER — Encounter: Payer: Self-pay | Admitting: Internal Medicine

## 2017-03-01 VITALS — BP 148/86 | HR 54 | Temp 97.7°F | Ht 73.0 in | Wt 222.0 lb

## 2017-03-01 DIAGNOSIS — Z23 Encounter for immunization: Secondary | ICD-10-CM

## 2017-03-01 DIAGNOSIS — J301 Allergic rhinitis due to pollen: Secondary | ICD-10-CM | POA: Diagnosis not present

## 2017-03-01 DIAGNOSIS — E538 Deficiency of other specified B group vitamins: Secondary | ICD-10-CM

## 2017-03-01 DIAGNOSIS — Z0000 Encounter for general adult medical examination without abnormal findings: Secondary | ICD-10-CM

## 2017-03-01 DIAGNOSIS — I10 Essential (primary) hypertension: Secondary | ICD-10-CM

## 2017-03-01 DIAGNOSIS — E559 Vitamin D deficiency, unspecified: Secondary | ICD-10-CM | POA: Diagnosis not present

## 2017-03-01 LAB — HEPATIC FUNCTION PANEL
ALBUMIN: 4.4 g/dL (ref 3.5–5.2)
ALT: 18 U/L (ref 0–53)
AST: 18 U/L (ref 0–37)
Alkaline Phosphatase: 109 U/L (ref 39–117)
BILIRUBIN TOTAL: 0.5 mg/dL (ref 0.2–1.2)
Bilirubin, Direct: 0.1 mg/dL (ref 0.0–0.3)
Total Protein: 7.2 g/dL (ref 6.0–8.3)

## 2017-03-01 LAB — BASIC METABOLIC PANEL
BUN: 10 mg/dL (ref 6–23)
CALCIUM: 9 mg/dL (ref 8.4–10.5)
CO2: 31 meq/L (ref 19–32)
Chloride: 105 mEq/L (ref 96–112)
Creatinine, Ser: 0.83 mg/dL (ref 0.40–1.50)
GFR: 117.85 mL/min (ref >60.00)
GLUCOSE: 93 mg/dL (ref 70–99)
POTASSIUM: 3.8 meq/L (ref 3.5–5.1)
SODIUM: 142 meq/L (ref 135–145)

## 2017-03-01 LAB — URINALYSIS
Bilirubin Urine: NEGATIVE
HGB URINE DIPSTICK: NEGATIVE
Ketones, ur: NEGATIVE
LEUKOCYTES UA: NEGATIVE
NITRITE: NEGATIVE
SPECIFIC GRAVITY, URINE: 1.02 (ref 1.000–1.030)
Total Protein, Urine: NEGATIVE
URINE GLUCOSE: NEGATIVE
UROBILINOGEN UA: 0.2 (ref 0.0–1.0)
pH: 5.5 (ref 5.0–8.0)

## 2017-03-01 LAB — CBC WITH DIFFERENTIAL/PLATELET
Basophils Absolute: 0 10*3/uL (ref 0.0–0.1)
Basophils Relative: 0.5 % (ref 0.0–3.0)
EOS PCT: 2.4 % (ref 0.0–5.0)
Eosinophils Absolute: 0.2 10*3/uL (ref 0.0–0.7)
HCT: 45.4 % (ref 39.0–52.0)
Hemoglobin: 15.3 g/dL (ref 13.0–17.0)
LYMPHS ABS: 2.5 10*3/uL (ref 0.7–4.0)
Lymphocytes Relative: 34.7 % (ref 12.0–46.0)
MCHC: 33.6 g/dL (ref 30.0–36.0)
MCV: 88.5 fl (ref 78.0–100.0)
MONO ABS: 0.8 10*3/uL (ref 0.1–1.0)
MONOS PCT: 11.6 % (ref 3.0–12.0)
NEUTROS ABS: 3.7 10*3/uL (ref 1.4–7.7)
NEUTROS PCT: 50.8 % (ref 43.0–77.0)
PLATELETS: 200 10*3/uL (ref 150.0–400.0)
RBC: 5.13 Mil/uL (ref 4.22–5.81)
RDW: 14 % (ref 11.5–15.5)
WBC: 7.3 10*3/uL (ref 4.0–10.5)

## 2017-03-01 LAB — VITAMIN B12: Vitamin B-12: 341 pg/mL (ref 211–911)

## 2017-03-01 LAB — LIPID PANEL
CHOLESTEROL: 181 mg/dL (ref 0–200)
HDL: 36 mg/dL — AB (ref >39.00)
LDL CALC: 113 mg/dL — AB (ref 0–99)
NonHDL: 145.43
TRIGLYCERIDES: 164 mg/dL — AB (ref 0.0–149.0)
Total CHOL/HDL Ratio: 5
VLDL: 32.8 mg/dL (ref 0.0–40.0)

## 2017-03-01 LAB — PSA: PSA: 0.97 ng/mL (ref 0.10–4.00)

## 2017-03-01 LAB — TSH: TSH: 2.16 u[IU]/mL (ref 0.35–4.50)

## 2017-03-01 LAB — VITAMIN D 25 HYDROXY (VIT D DEFICIENCY, FRACTURES): VITD: 24.69 ng/mL — AB (ref 30.00–100.00)

## 2017-03-01 MED ORDER — VITAMIN D3 1.25 MG (50000 UT) PO CAPS: 1.0000 | ORAL_CAPSULE | ORAL | 0 refills | Status: DC

## 2017-03-01 MED ORDER — VITAMIN D3 50 MCG (2000 UT) PO CAPS: 2000.0000 [IU] | ORAL_CAPSULE | Freq: Every day | ORAL | 3 refills | Status: AC

## 2017-03-01 NOTE — Progress Notes (Addendum)
Subjective:  Patient ID: [name] [name], male    DOB: 02-20-1946  Age: 71 y.o. MRN: [id number]  CC: No chief complaint on file.   HPI [name] presents for HTN. C/o HAs. F/u allergies, Vit D, B12 def C/o peeing too much on Furosemide Not taking Amlodipine and Coreg for ?reason  Outpatient Medications Prior to Visit  Medication Sig Dispense Refill  . furosemide (LASIX) 40 MG tablet Take 1 tablet (40 mg total) by mouth daily. 90 tablet 2  . olmesartan (BENICAR) 40 MG tablet Take 1 tablet (40 mg total) by mouth daily. 90 tablet 1  . amLODipine (NORVASC) 10 MG tablet Take 1 tablet (10 mg total) by mouth daily. (Patient not taking: Reported on 01/08/2017) 90 tablet 3  . B Complex Vitamins (VITAMIN B COMPLEX) TABS Take 1 tablet by mouth daily.    . carvedilol (COREG) 25 MG tablet 1TAKE 1 TABLET (25 MG TOTAL) BY MOUTH 2 (TWO) TIMES DAILY WITH A MEAL. (Patient not taking: Reported on 03/01/2017) 180 tablet 2  . Cholecalciferol 1000 UNITS tablet     . ergocalciferol (VITAMIN D2) 50000 units capsule Take 1 capsule (50,000 Units total) by mouth once a week. (Patient not taking: Reported on 01/08/2017) 6 capsule 0  . fluticasone (FLONASE) 50 MCG/ACT nasal spray Place 2 sprays into both nostrils daily. (Patient not taking: Reported on 03/01/2017) 16 g 0  . levocetirizine (XYZAL) 5 MG tablet Take 1 tablet (5 mg total) by mouth every evening. (Patient not taking: Reported on 01/08/2017) 30 tablet 3  . Multiple Vitamins-Minerals (SENIOR MULTIVITAMIN PLUS) TABS Take 1 tablet by mouth daily.    . ranitidine (ZANTAC) 300 MG tablet Take 1 tablet (300 mg total) by mouth at bedtime. (Patient not taking: Reported on 01/08/2017) 30 tablet 11  . predniSONE (STERAPRED UNI-PAK 21 TAB) 10 MG (21) TBPK tablet As directed (Patient not taking: Reported on 03/01/2017) 21 tablet 0   Facility-Administered Medications Prior to Visit  Medication Dose Route Frequency Provider Last Rate Last Dose  . 0.9 %  sodium chloride  infusion  500 mL Intravenous Continuous Irene Shipper, MD        ROS Review of Systems  Constitutional: Negative for appetite change, fatigue and unexpected weight change.  HENT: Negative for congestion, nosebleeds, sneezing, sore throat and trouble swallowing.   Eyes: Negative for itching and visual disturbance.  Respiratory: Negative for cough.   Cardiovascular: Negative for chest pain, palpitations and leg swelling.  Gastrointestinal: Negative for abdominal distention, blood in stool, diarrhea and nausea.  Genitourinary: Negative for frequency and hematuria.  Musculoskeletal: Negative for back pain, gait problem, joint swelling and neck pain.  Skin: Negative for rash.  Neurological: Positive for headaches. Negative for dizziness, tremors, speech difficulty and weakness.  Psychiatric/Behavioral: Negative for agitation, dysphoric mood and sleep disturbance. The patient is not nervous/anxious.     Objective:  BP (!) 148/86 (BP Location: Left Arm, Patient Position: Sitting, Cuff Size: Large)   Pulse (!) 54   Temp 97.7 F (36.5 C) (Oral)   Ht 6\' 1"  (1.854 m)   Wt 222 lb (100.7 kg)   SpO2 99%   BMI 29.29 kg/m   BP Readings from Last 3 Encounters:  03/01/17 (!) 148/86  01/08/17 (!) 144/86  12/26/16 (!) 146/88    Wt Readings from Last 3 Encounters:  03/01/17 222 lb (100.7 kg)  01/08/17 219 lb (99.3 kg)  12/26/16 224 lb (101.6 kg)    Physical Exam  Constitutional: He  is oriented to person, place, and time. He appears well-developed. No distress.  NAD  HENT:  Mouth/Throat: Oropharynx is clear and moist.  Eyes: Pupils are equal, round, and reactive to light. Conjunctivae are normal.  Neck: Normal range of motion. No JVD present. No thyromegaly present.  Cardiovascular: Normal rate, regular rhythm, normal heart sounds and intact distal pulses.  Exam reveals no gallop and no friction rub.   No murmur heard. Pulmonary/Chest: Effort normal and breath sounds normal. No  respiratory distress. He has no wheezes. He has no rales. He exhibits no tenderness.  Abdominal: Soft. Bowel sounds are normal. He exhibits no distension and no mass. There is no tenderness. There is no rebound and no guarding.  Musculoskeletal: Normal range of motion. He exhibits no edema or tenderness.  Lymphadenopathy:    He has no cervical adenopathy.  Neurological: He is alert and oriented to person, place, and time. He has normal reflexes. No cranial nerve deficit. He exhibits normal muscle tone. He displays a negative Romberg sign. Coordination and gait normal.  Skin: Skin is warm and dry. No rash noted.  Psychiatric: He has a normal mood and affect. His behavior is normal. Judgment and thought content normal.    Lab Results  Component Value Date   WBC 7.4 06/26/2016   HGB 15.0 06/26/2016   HCT 43.0 06/26/2016   PLT 207.0 06/26/2016   GLUCOSE 106 (H) 06/26/2016   CHOL 161 06/26/2016   TRIG 93.0 06/26/2016   HDL 40.40 06/26/2016   LDLDIRECT 134.0 02/08/2012   LDLCALC 102 (H) 06/26/2016   ALT 21 06/26/2016   AST 20 06/26/2016   NA 141 06/26/2016   K 3.9 06/26/2016   CL 106 06/26/2016   CREATININE 1.02 06/26/2016   BUN 15 06/26/2016   CO2 28 06/26/2016   TSH 2.41 06/26/2016   PSA 1.00 06/26/2016   HGBA1C 5.6 05/08/2006    Dg Chest 2 View  Result Date: 05/30/2013 CLINICAL DATA:  Cough EXAM: CHEST  2 VIEW COMPARISON:  02/27/2012 FINDINGS: Bandlike lingula density compatible with atelectasis or scarring. Normal heart size. Stable prominent hilar vascularity. No edema pattern or pneumonia. No collapse or consolidation. Negative for effusion or pneumothorax. Trachea midline. IMPRESSION: Lingula atelectasis versus scarring. Otherwise stable exam without acute process Electronically Signed   By: Daryll Brod M.D.   On: 05/30/2013 11:20    Assessment & Plan:   There are no diagnoses linked to this encounter. I have discontinued Mr. Ice predniSONE. I am also having him  maintain his Cholecalciferol, Vitamin B Complex, SENIOR MULTIVITAMIN PLUS, amLODipine, ergocalciferol, furosemide, carvedilol, levocetirizine, ranitidine, fluticasone, and olmesartan. We will continue to administer sodium chloride.  No orders of the defined types were placed in this encounter.    Follow-up: No Follow-up on file.  Walker Kehr, MD

## 2017-03-01 NOTE — Assessment & Plan Note (Addendum)
C/o peeing too much on Furosemide - d/c Not taking Amlodipine and Coreg for ?reason - re-start Amlodipine. Cont Olmesartan 10/18 C/o peeing too much on Furosemide - d/c Not taking Amlodipine and Coreg for ?reason - re-start Amlodipine. Cont Olmesartan

## 2017-03-01 NOTE — Assessment & Plan Note (Signed)
Xyzal 

## 2017-03-01 NOTE — Addendum Note (Signed)
Addended by: Karren Cobble on: 03/01/2017 01:04 PM   Modules accepted: Orders

## 2017-03-01 NOTE — Assessment & Plan Note (Signed)
Re-start B12 

## 2017-03-02 ENCOUNTER — Other Ambulatory Visit: Payer: Self-pay

## 2017-03-02 MED ORDER — AMLODIPINE BESYLATE 10 MG PO TABS: 10.0000 mg | ORAL_TABLET | Freq: Every day | ORAL | 0 refills | Status: DC

## 2017-05-09 ENCOUNTER — Encounter: Payer: Self-pay | Admitting: Family Medicine

## 2017-05-09 ENCOUNTER — Ambulatory Visit (INDEPENDENT_AMBULATORY_CARE_PROVIDER_SITE_OTHER): Payer: Medicare HMO | Admitting: Family Medicine

## 2017-05-09 DIAGNOSIS — J069 Acute upper respiratory infection, unspecified: Secondary | ICD-10-CM

## 2017-05-09 MED ORDER — PREDNISONE 5 MG PO TABS: ORAL_TABLET | ORAL | 0 refills | Status: DC

## 2017-05-09 NOTE — Patient Instructions (Signed)
Please try things such as zyrtec-D or allegra-D which is an antihistamine and decongestant.   Please try afrin which will help with nasal congestion but use for only three days.   Please also try using a netti pot on a regular occasion.  Honey can help with a sore throat.   Delsym can help with cough.

## 2017-05-09 NOTE — Progress Notes (Signed)
KEILEN KAHL - 71 y.o. male MRN [id number]  Date of birth: [birth year]  SUBJECTIVE:  Including CC & ROS.  Chief Complaint  Patient presents with  . Cough    Quamere Mussell is a 71 y.o. male that is presenting with a cough and stuffy nose.  Ongoing for one week. Denies fevers or chills. Patient took Mucinex with no improvement.  He has been around his grandchildren had a similar symptoms. He is an active smoker. Has tried Mucinex and other medications over-the-counter. Feels like his cough is worse at night. Has a history of some asthma. Has an inhaler but has not used it.  Review of his chest x-ray from 2015 shows lingula atelectasis versus scarring.   Review of Systems  Constitutional: Negative for fever.  HENT: Positive for rhinorrhea and sinus pressure.   Respiratory: Positive for cough. Negative for shortness of breath.   Cardiovascular: Negative for chest pain.  Musculoskeletal: Negative for gait problem.    HISTORY: Past Medical, Surgical, Social, and Family History Reviewed & Updated per EMR.   Pertinent Historical Findings include:  Past Medical History:  Diagnosis Date  . Allergy   . Arthritis   . BPH (benign prostatic hyperplasia)   . ED (erectile dysfunction)   . GERD (gastroesophageal reflux disease)   . Heart murmur    hx  . Hyperlipidemia   . Hypertension   . LBP (low back pain)   . PONV (postoperative nausea and vomiting)    with epidural inj  . Vitamin B 12 deficiency     Past Surgical History:  Procedure Laterality Date  . BACK SURGERY     fusion of lower back   . COLONOSCOPY    . KNEE ARTHROSCOPY  02   Right  . POLYPECTOMY      Allergies  Allergen Reactions  . Hydrochlorothiazide     REACTION: HA, eyes hurt    Family History  Problem Relation Age of Onset  . Cirrhosis Mother   . Alcohol abuse Mother   . Cirrhosis Father   . Alcohol abuse Father   . Heart disease Sister 60       MI  . Colon cancer Neg Hx   . Colon polyps Neg Hx   .  Esophageal cancer Neg Hx   . Rectal cancer Neg Hx   . Stomach cancer Neg Hx      Social History   Socioeconomic History  . Marital status: Married    Spouse name: Not on file  . Number of children: Not on file  . Years of education: Not on file  . Highest education level: Not on file  Social Needs  . Financial resource strain: Not on file  . Food insecurity - worry: Not on file  . Food insecurity - inability: Not on file  . Transportation needs - medical: Not on file  . Transportation needs - non-medical: Not on file  Occupational History  . Occupation: DRIVER    Employer: [employer]    Comment: night shift  Tobacco Use  . Smoking status: Current Some Day Smoker    Packs/day: 0.25    Years: 41.00    Pack years: 10.25    Types: Cigars, Cigarettes  . Smokeless tobacco: Never Used  . Tobacco comment: 1-2/d  occ smoke   occ drink liquor  Substance and Sexual Activity  . Alcohol use: Yes    Comment: Occasional  . Drug use: No  . Sexual activity: Yes  Other  Topics Concern  . Not on file  Social History Narrative   Family history of hypertension     PHYSICAL EXAM:  VS: BP (!) 148/82 (BP Location: Left Arm, Patient Position: Sitting, Cuff Size: Normal)   Pulse 62   Temp 98.1 F (36.7 C) (Oral)   Ht 6\' 1"  (1.854 m)   Wt 224 lb (101.6 kg)   SpO2 97%   BMI 29.55 kg/m  Physical Exam Gen: NAD, alert, cooperative with exam,  ENT: normal lips, normal nasal mucosa, tympanic membranes clear and intact bilaterally, normal oropharynx, no cervical lymphadenopathy Eye: normal EOM, normal conjunctiva and lids CV:  no edema, +2 pedal pulses, regular rate and rhythm, S1-S2   Resp: no accessory muscle use, non-labored, clear to auscultation bilaterally, no crackles, intermittent rhonchi Skin: no rashes, no areas of induration  Neuro: normal tone, normal sensation to touch Psych:  normal insight, alert and oriented MSK: Normal gait, normal strength       ASSESSMENT  & PLAN:   URI (upper respiratory infection) Likely viral in nature.  - counseled on supportive care - prednisone provided.

## 2017-05-09 NOTE — Assessment & Plan Note (Signed)
Likely viral in nature.  - counseled on supportive care - prednisone provided.

## 2017-05-24 ENCOUNTER — Telehealth: Payer: Self-pay | Admitting: Internal Medicine

## 2017-05-24 NOTE — Telephone Encounter (Signed)
Pt requesting Rx of Cialis be changed to it's generic form due to cost.  Routed to provider.

## 2017-05-24 NOTE — Telephone Encounter (Signed)
Please advise 

## 2017-05-26 NOTE — Telephone Encounter (Signed)
If the pharmacy has it - they can dispense it w/the existing Rx Thx

## 2017-05-27 MED ORDER — TADALAFIL 20 MG PO TABS: 20.0000 mg | ORAL_TABLET | Freq: Every day | ORAL | 3 refills | Status: DC | PRN

## 2017-05-27 NOTE — Addendum Note (Signed)
Addended by: Karren Cobble on: 05/27/2017 12:27 PM   Modules accepted: Orders

## 2017-05-27 NOTE — Telephone Encounter (Signed)
RX sent as generic

## 2017-06-11 ENCOUNTER — Other Ambulatory Visit: Payer: Self-pay | Admitting: Internal Medicine

## 2017-06-20 ENCOUNTER — Telehealth: Payer: Self-pay

## 2017-06-20 NOTE — Telephone Encounter (Signed)
error 

## 2017-06-26 ENCOUNTER — Ambulatory Visit (INDEPENDENT_AMBULATORY_CARE_PROVIDER_SITE_OTHER): Payer: Medicare HMO | Admitting: Internal Medicine

## 2017-06-26 ENCOUNTER — Encounter: Payer: Self-pay | Admitting: Internal Medicine

## 2017-06-26 ENCOUNTER — Ambulatory Visit (INDEPENDENT_AMBULATORY_CARE_PROVIDER_SITE_OTHER): Payer: Medicare HMO | Admitting: *Deleted

## 2017-06-26 ENCOUNTER — Other Ambulatory Visit (INDEPENDENT_AMBULATORY_CARE_PROVIDER_SITE_OTHER): Payer: Medicare HMO

## 2017-06-26 DIAGNOSIS — N529 Male erectile dysfunction, unspecified: Secondary | ICD-10-CM

## 2017-06-26 DIAGNOSIS — E559 Vitamin D deficiency, unspecified: Secondary | ICD-10-CM

## 2017-06-26 DIAGNOSIS — I10 Essential (primary) hypertension: Secondary | ICD-10-CM

## 2017-06-26 DIAGNOSIS — R972 Elevated prostate specific antigen [PSA]: Secondary | ICD-10-CM | POA: Diagnosis not present

## 2017-06-26 DIAGNOSIS — E538 Deficiency of other specified B group vitamins: Secondary | ICD-10-CM

## 2017-06-26 DIAGNOSIS — Z0000 Encounter for general adult medical examination without abnormal findings: Secondary | ICD-10-CM

## 2017-06-26 LAB — BASIC METABOLIC PANEL
BUN: 13 mg/dL (ref 6–23)
CALCIUM: 9.7 mg/dL (ref 8.4–10.5)
CO2: 29 mEq/L (ref 19–32)
Chloride: 106 mEq/L (ref 96–112)
Creatinine, Ser: 0.88 mg/dL (ref 0.40–1.50)
GFR: 110.05 mL/min (ref >60.00)
Glucose, Bld: 126 mg/dL — ABNORMAL HIGH (ref 70–99)
POTASSIUM: 4.1 meq/L (ref 3.5–5.1)
SODIUM: 143 meq/L (ref 135–145)

## 2017-06-26 MED ORDER — SILDENAFIL CITRATE 20 MG PO TABS: ORAL_TABLET | ORAL | 3 refills | Status: DC

## 2017-06-26 MED ORDER — CARVEDILOL 25 MG PO TABS: ORAL_TABLET | ORAL | 3 refills | Status: DC

## 2017-06-26 MED ORDER — OLMESARTAN MEDOXOMIL 40 MG PO TABS: 40.0000 mg | ORAL_TABLET | Freq: Every day | ORAL | 3 refills | Status: DC

## 2017-06-26 MED ORDER — AMLODIPINE BESYLATE 10 MG PO TABS: 10.0000 mg | ORAL_TABLET | Freq: Every day | ORAL | 3 refills | Status: DC

## 2017-06-26 MED ORDER — RANITIDINE HCL 300 MG PO TABS: 300.0000 mg | ORAL_TABLET | Freq: Every day | ORAL | 3 refills | Status: DC

## 2017-06-26 NOTE — Assessment & Plan Note (Signed)
PSA

## 2017-06-26 NOTE — Assessment & Plan Note (Signed)
On B12 

## 2017-06-26 NOTE — Progress Notes (Addendum)
Subjective:   [name] [name] is a 72 y.o. male who presents for Medicare Annual/Subsequent preventive examination.  Review of Systems:  No ROS.  Medicare Wellness Visit. Additional risk factors are reflected in the social history.  Cardiac Risk Factors include: advanced age (>71men, >2 women);dyslipidemia;hypertension;male gender Sleep patterns: feels rested on waking, gets up 1 times nightly to void and sleeps 6-7 hours nightly.   Home Safety/Smoke Alarms: Feels safe in home. Smoke alarms in place.  Living environment; residence and Firearm Safety: 1-story house/ trailer, no firearms. Lives with significant other, no needs for DME, good support system Seat Belt Safety/Bike Helmet: Wears seat belt.   PSA-  Lab Results  Component Value Date   PSA 0.97 03/01/2017   PSA 1.00 06/26/2016   PSA 0.86 06/23/2015       Objective:    Vitals: There were no vitals taken for this visit.  There is no height or weight on file to calculate BMI.  Advanced Directives 06/26/2017 08/28/2016 08/14/2016 03/07/2012 02/27/2012  Does Patient Have a Medical Advance Directive? No No No Patient does not have advance directive Patient does not have advance directive  Would patient like information on creating a medical advance directive? Yes (ED - Information included in AVS) - - - -    Tobacco Social History   Tobacco Use  Smoking Status Current Some Day Smoker  . Packs/day: 0.25  . Years: 41.00  . Pack years: 10.25  . Types: Cigars, Cigarettes  Smokeless Tobacco Never Used  Tobacco Comment   1-2/d  occ smoke   occ drink liquor     Ready to quit: Not Answered Counseling given: Not Answered Comment: 1-2/d  occ smoke   occ drink liquor  Past Medical History:  Diagnosis Date  . Allergy   . Arthritis   . BPH (benign prostatic hyperplasia)   . ED (erectile dysfunction)   . GERD (gastroesophageal reflux disease)   . Heart murmur    hx  . Hyperlipidemia   . Hypertension   . LBP (low back pain)    . PONV (postoperative nausea and vomiting)    with epidural inj  . Vitamin B 12 deficiency    Past Surgical History:  Procedure Laterality Date  . BACK SURGERY     fusion of lower back   . COLONOSCOPY    . KNEE ARTHROSCOPY  02   Right  . POLYPECTOMY     Family History  Problem Relation Age of Onset  . Cirrhosis Mother   . Alcohol abuse Mother   . Cirrhosis Father   . Alcohol abuse Father   . Heart disease Sister 24       MI  . Colon cancer Neg Hx   . Colon polyps Neg Hx   . Esophageal cancer Neg Hx   . Rectal cancer Neg Hx   . Stomach cancer Neg Hx    Social History   Socioeconomic History  . Marital status: Single    Spouse name: Not on file  . Number of children: Not on file  . Years of education: Not on file  . Highest education level: Not on file  Social Needs  . Financial resource strain: Not hard at all  . Food insecurity - worry: Never true  . Food insecurity - inability: Never true  . Transportation needs - medical: No  . Transportation needs - non-medical: No  Occupational History  . Occupation: DRIVER    Employer: [employer]  Comment: night shift  Tobacco Use  . Smoking status: Current Some Day Smoker    Packs/day: 0.25    Years: 41.00    Pack years: 10.25    Types: Cigars, Cigarettes  . Smokeless tobacco: Never Used  . Tobacco comment: 1-2/d  occ smoke   occ drink liquor  Substance and Sexual Activity  . Alcohol use: Yes    Comment: Occasional  . Drug use: No  . Sexual activity: Yes  Other Topics Concern  . Not on file  Social History Narrative   Family history of hypertension    Outpatient Encounter Medications as of 06/26/2017  Medication Sig  . B Complex Vitamins (VITAMIN B COMPLEX) TABS Take 1 tablet by mouth daily.  . Cholecalciferol (VITAMIN D3) 2000 units capsule Take 1 capsule (2,000 Units total) by mouth daily.  . Cholecalciferol (VITAMIN D3) 50000 units CAPS Take 1 capsule by mouth once a week.  . fluticasone  (FLONASE) 50 MCG/ACT nasal spray Place 2 sprays into both nostrils daily.  Marland Kitchen levocetirizine (XYZAL) 5 MG tablet Take 1 tablet (5 mg total) by mouth every evening.  . Multiple Vitamins-Minerals (SENIOR MULTIVITAMIN PLUS) TABS Take 1 tablet by mouth daily.  . [DISCONTINUED] amLODipine (NORVASC) 10 MG tablet TAKE 1 TABLET BY MOUTH EVERY DAY  . [DISCONTINUED] carvedilol (COREG) 25 MG tablet 1TAKE 1 TABLET (25 MG TOTAL) BY MOUTH 2 (TWO) TIMES DAILY WITH A MEAL.  . [DISCONTINUED] olmesartan (BENICAR) 40 MG tablet Take 1 tablet (40 mg total) by mouth daily.  . [DISCONTINUED] ranitidine (ZANTAC) 300 MG tablet Take 1 tablet (300 mg total) by mouth at bedtime.  . [DISCONTINUED] spironolactone (ALDACTONE) 50 MG tablet Take 50 mg by mouth daily. For blood pressure   . [DISCONTINUED] tadalafil (CIALIS) 20 MG tablet Take 1 tablet (20 mg total) by mouth daily as needed for erectile dysfunction.   Facility-Administered Encounter Medications as of 06/26/2017  Medication  . 0.9 %  sodium chloride infusion    Activities of Daily Living In your present state of health, do you have any difficulty performing the following activities: 06/26/2017  Hearing? N  Vision? N  Difficulty concentrating or making decisions? N  Walking or climbing stairs? N  Dressing or bathing? N  Doing errands, shopping? N  Preparing Food and eating ? N  Using the Toilet? N  In the past six months, have you accidently leaked urine? N  Do you have problems with loss of bowel control? N  Managing your Medications? N  Managing your Finances? N  Housekeeping or managing your Housekeeping? N  Some recent data might be hidden    Patient Care Team: Plotnikov, Evie Lacks, MD as PCP - General Plotnikov, Evie Lacks, MD Irene Shipper, MD as Consulting Physician (Gastroenterology)   Assessment:   This is a routine wellness examination for [facility]. Physical assessment deferred to PCP.   Exercise Activities and Dietary recommendations Current  Exercise Habits: The patient has a physically strenous job, but has no regular exercise apart from work., Exercise limited by: None identified Diet (meal preparation, eat out, water intake, caffeinated beverages, dairy products, fruits and vegetables): in general, a "healthy" diet     Reviewed heart healthy diet, encouraged patient to increase daily water intake.  Goals    . Patient Stated     Decrease the amount of hours I work so that I have more time for myself to enjoy life and family. Take the time to travel and do what I want to  do.       Fall Risk Fall Risk  06/26/2017 12/26/2016 12/20/2015 12/15/2014  Falls in the past year? No No No Yes  Number falls in past yr: - - - 1  Injury with Fall? - - - No    Depression Screen PHQ 2/9 Scores 06/26/2017 12/26/2016 12/20/2015 12/15/2014  PHQ - 2 Score 0 0 0 0  PHQ- 9 Score 0 - - -    Cognitive Function       Ad8 score reviewed for issues:  Issues making decisions: no  Less interest in hobbies / activities: no  Repeats questions, stories (family complaining): no  Trouble using ordinary gadgets (microwave, computer, phone):no  Forgets the month or year: no  Mismanaging finances: no  Remembering appts: no  Daily problems with thinking and/or memory: no Ad8 score is= 0    Immunization History  Administered Date(s) Administered  . Influenza, High Dose Seasonal PF 03/04/2013, 06/25/2016, 03/01/2017  . Influenza,inj,Quad PF,6+ Mos 01/13/2014, 06/22/2015  . Pneumococcal Conjugate-13 09/09/2013  . Pneumococcal Polysaccharide-23 09/03/2012  . Td 03/30/2009  . Zoster 02/14/2010   Screening Tests Health Maintenance  Topic Date Due  . TETANUS/TDAP  03/31/2019  . COLONOSCOPY  08/29/2026  . INFLUENZA VACCINE  Completed  . Hepatitis C Screening  Completed  . PNA vac Low Risk Adult  Completed      Plan:    Continue doing brain stimulating activities (puzzles, reading, adult coloring books, staying active) to keep memory sharp.     Continue to eat heart healthy diet (full of fruits, vegetables, whole grains, lean protein, water--limit salt, fat, and sugar intake) and increase physical activity as tolerated.  I have personally reviewed and noted the following in the patient's chart:   . Medical and social history . Use of alcohol, tobacco or illicit drugs  . Current medications and supplements . Functional ability and status . Nutritional status . Physical activity . Advanced directives . List of other physicians . Vitals . Screenings to include cognitive, depression, and falls . Referrals and appointments  In addition, I have reviewed and discussed with patient certain preventive protocols, quality metrics, and best practice recommendations. A written personalized care plan for preventive services as well as general preventive health recommendations were provided to patient.     Michiel Cowboy, RN  06/26/2017  Medical screening examination/treatment/procedure(s) were performed by non-physician practitioner and as supervising physician I was immediately available for consultation/collaboration. I agree with above. Lew Dawes, MD

## 2017-06-26 NOTE — Progress Notes (Signed)
Subjective:  Patient ID: [name] [name], male    DOB: [birth year]  Age: 72 y.o. MRN: [id number]  CC: No chief complaint on file.   HPI [name] [name] presents for HTN, GERD, ED Cialis was too $$$  Outpatient Medications Prior to Visit  Medication Sig Dispense Refill  . amLODipine (NORVASC) 10 MG tablet TAKE 1 TABLET BY MOUTH EVERY DAY 90 tablet 1  . B Complex Vitamins (VITAMIN B COMPLEX) TABS Take 1 tablet by mouth daily.    . carvedilol (COREG) 25 MG tablet 1TAKE 1 TABLET (25 MG TOTAL) BY MOUTH 2 (TWO) TIMES DAILY WITH A MEAL. 180 tablet 2  . Cholecalciferol (VITAMIN D3) 2000 units capsule Take 1 capsule (2,000 Units total) by mouth daily. 100 capsule 3  . Cholecalciferol (VITAMIN D3) 50000 units CAPS Take 1 capsule by mouth once a week. 6 capsule 0  . fluticasone (FLONASE) 50 MCG/ACT nasal spray Place 2 sprays into both nostrils daily. 16 g 0  . levocetirizine (XYZAL) 5 MG tablet Take 1 tablet (5 mg total) by mouth every evening. 30 tablet 3  . Multiple Vitamins-Minerals (SENIOR MULTIVITAMIN PLUS) TABS Take 1 tablet by mouth daily.    Marland Kitchen olmesartan (BENICAR) 40 MG tablet Take 1 tablet (40 mg total) by mouth daily. 90 tablet 1  . ranitidine (ZANTAC) 300 MG tablet Take 1 tablet (300 mg total) by mouth at bedtime. 30 tablet 11  . tadalafil (CIALIS) 20 MG tablet Take 1 tablet (20 mg total) by mouth daily as needed for erectile dysfunction. 30 tablet 3  . predniSONE (DELTASONE) 5 MG tablet Take 6 pills for first day, 5 pills second day, 4 pills third day, 3 pills fourth day, 2 pills the fifth day, and 1 pill sixth day. 21 tablet 0   Facility-Administered Medications Prior to Visit  Medication Dose Route Frequency Provider Last Rate Last Dose  . 0.9 %  sodium chloride infusion  500 mL Intravenous Continuous Irene Shipper, MD        ROS Review of Systems  Constitutional: Negative for appetite change, fatigue and unexpected weight change.  HENT: Negative for congestion, nosebleeds, sneezing,  sore throat and trouble swallowing.   Eyes: Negative for itching and visual disturbance.  Respiratory: Negative for cough.   Cardiovascular: Negative for chest pain, palpitations and leg swelling.  Gastrointestinal: Negative for abdominal distention, blood in stool, diarrhea and nausea.  Genitourinary: Negative for frequency and hematuria.  Musculoskeletal: Negative for back pain, gait problem, joint swelling and neck pain.  Skin: Negative for rash.  Neurological: Negative for dizziness, tremors, speech difficulty and weakness.  Psychiatric/Behavioral: Negative for agitation, dysphoric mood and sleep disturbance. The patient is not nervous/anxious.     Objective:  BP 124/68 (BP Location: Left Arm, Patient Position: Sitting, Cuff Size: Large)   Pulse (!) 52   Temp 97.8 F (36.6 C) (Oral)   Ht 6\' 1"  (1.854 m)   Wt 218 lb (98.9 kg)   SpO2 99%   BMI 28.76 kg/m   BP Readings from Last 3 Encounters:  06/26/17 124/68  05/09/17 (!) 148/82  03/01/17 (!) 148/86    Wt Readings from Last 3 Encounters:  06/26/17 218 lb (98.9 kg)  05/09/17 224 lb (101.6 kg)  03/01/17 222 lb (100.7 kg)    Physical Exam  Constitutional: He is oriented to person, place, and time. He appears well-developed. No distress.  NAD  HENT:  Mouth/Throat: Oropharynx is clear and moist.  Eyes: Conjunctivae are normal. Pupils are  equal, round, and reactive to light.  Neck: Normal range of motion. No JVD present. No thyromegaly present.  Cardiovascular: Normal rate, regular rhythm, normal heart sounds and intact distal pulses. Exam reveals no gallop and no friction rub.  No murmur heard. Pulmonary/Chest: Effort normal and breath sounds normal. No respiratory distress. He has no wheezes. He has no rales. He exhibits no tenderness.  Abdominal: Soft. Bowel sounds are normal. He exhibits no distension and no mass. There is no tenderness. There is no rebound and no guarding.  Musculoskeletal: Normal range of motion. He  exhibits no edema or tenderness.  Lymphadenopathy:    He has no cervical adenopathy.  Neurological: He is alert and oriented to person, place, and time. He has normal reflexes. No cranial nerve deficit. He exhibits normal muscle tone. He displays a negative Romberg sign. Coordination and gait normal.  Skin: Skin is warm and dry. No rash noted.  Psychiatric: He has a normal mood and affect. His behavior is normal. Judgment and thought content normal.    Lab Results  Component Value Date   WBC 7.3 03/01/2017   HGB 15.3 03/01/2017   HCT 45.4 03/01/2017   PLT 200.0 03/01/2017   GLUCOSE 93 03/01/2017   CHOL 181 03/01/2017   TRIG 164.0 (H) 03/01/2017   HDL 36.00 (L) 03/01/2017   LDLDIRECT 134.0 02/08/2012   LDLCALC 113 (H) 03/01/2017   ALT 18 03/01/2017   AST 18 03/01/2017   NA 142 03/01/2017   K 3.8 03/01/2017   CL 105 03/01/2017   CREATININE 0.83 03/01/2017   BUN 10 03/01/2017   CO2 31 03/01/2017   TSH 2.16 03/01/2017   PSA 0.97 03/01/2017   HGBA1C 5.6 05/08/2006    Dg Chest 2 View  Result Date: 05/30/2013 CLINICAL DATA:  Cough EXAM: CHEST  2 VIEW COMPARISON:  02/27/2012 FINDINGS: Bandlike lingula density compatible with atelectasis or scarring. Normal heart size. Stable prominent hilar vascularity. No edema pattern or pneumonia. No collapse or consolidation. Negative for effusion or pneumothorax. Trachea midline. IMPRESSION: Lingula atelectasis versus scarring. Otherwise stable exam without acute process Electronically Signed   By: Daryll Brod M.D.   On: 05/30/2013 11:20    Assessment & Plan:   There are no diagnoses linked to this encounter. I have discontinued [name]'s predniSONE. I am also having him maintain his Vitamin B Complex, SENIOR MULTIVITAMIN PLUS, carvedilol, levocetirizine, ranitidine, fluticasone, olmesartan, Vitamin D3, Vitamin D3, tadalafil, and amLODipine. We will continue to administer sodium chloride.  No orders of the defined types were placed in  this encounter.    Follow-up: No Follow-up on file.  Walker Kehr, MD

## 2017-06-26 NOTE — Assessment & Plan Note (Signed)
Coreg, Diovan and Amlodipine

## 2017-06-26 NOTE — Assessment & Plan Note (Signed)
On Vit D 

## 2017-06-26 NOTE — Patient Instructions (Signed)
Continue doing brain stimulating activities (puzzles, reading, adult coloring books, staying active) to keep memory sharp.   Continue to eat heart healthy diet (full of fruits, vegetables, whole grains, lean protein, water--limit salt, fat, and sugar intake) and increase physical activity as tolerated.   [name] [name] , Thank you for taking time to come for your Medicare Wellness Visit. I appreciate your ongoing commitment to your health goals. Please review the following plan we discussed and let me know if I can assist you in the future.   These are the goals we discussed: Goals    . Patient Stated     Decrease the amount of hours I work so that I have more time for self to enjoy life and family. Take the time to travel and do what I want to do.       This is a list of the screening recommended for you and due dates:  Health Maintenance  Topic Date Due  . Tetanus Vaccine  03/31/2019  . Colon Cancer Screening  08/29/2026  . Flu Shot  Completed  .  Hepatitis C: One time screening is recommended by Center for Disease Control  (CDC) for  adults born from [birth year] through 1964.   Completed  . Pneumonia vaccines  Completed

## 2017-06-26 NOTE — Assessment & Plan Note (Signed)
Revatio Rx

## 2017-06-27 ENCOUNTER — Other Ambulatory Visit: Payer: Self-pay | Admitting: Internal Medicine

## 2017-06-30 ENCOUNTER — Other Ambulatory Visit: Payer: Self-pay | Admitting: Internal Medicine

## 2017-06-30 DIAGNOSIS — R0789 Other chest pain: Secondary | ICD-10-CM

## 2017-07-08 ENCOUNTER — Telehealth (HOSPITAL_COMMUNITY): Payer: Self-pay | Admitting: *Deleted

## 2017-07-08 NOTE — Telephone Encounter (Signed)
Patient given detailed instructions per Myocardial Perfusion Study Information Sheet for the test on 07/10/17. Patient notified to arrive 15 minutes early and that it is imperative to arrive on time for appointment to keep from having the test rescheduled.  If you need to cancel or reschedule your appointment, please call the office within 24 hours of your appointment. . Patient verbalized understanding. Kirstie Peri

## 2017-07-08 NOTE — Telephone Encounter (Signed)
Left message on voicemail in reference to upcoming appointment scheduled for 07/10/17. Phone number given for a call back so details instructions can be given. [name] [name]

## 2017-07-10 ENCOUNTER — Ambulatory Visit (HOSPITAL_COMMUNITY): Payer: Medicare HMO | Attending: Internal Medicine

## 2017-07-10 ENCOUNTER — Other Ambulatory Visit: Payer: Self-pay | Admitting: Internal Medicine

## 2017-07-10 ENCOUNTER — Encounter: Payer: Self-pay | Admitting: Internal Medicine

## 2017-07-10 DIAGNOSIS — I2510 Atherosclerotic heart disease of native coronary artery without angina pectoris: Secondary | ICD-10-CM | POA: Diagnosis not present

## 2017-07-10 DIAGNOSIS — I10 Essential (primary) hypertension: Secondary | ICD-10-CM | POA: Diagnosis not present

## 2017-07-10 DIAGNOSIS — R079 Chest pain, unspecified: Secondary | ICD-10-CM | POA: Insufficient documentation

## 2017-07-10 DIAGNOSIS — R0789 Other chest pain: Secondary | ICD-10-CM

## 2017-07-10 LAB — MYOCARDIAL PERFUSION IMAGING
CHL CUP NUCLEAR SRS: 0
CHL CUP RESTING HR STRESS: 54 {beats}/min
CHL RATE OF PERCEIVED EXERTION: 18
CSEPED: 9 min
CSEPHR: 89 %
Estimated workload: 10.1 METS
LV dias vol: 114 mL (ref 62–150)
LVSYSVOL: 42 mL
MPHR: 150 {beats}/min
Peak HR: 134 {beats}/min
RATE: 0.35
SDS: 5
SSS: 5
TID: 1.03

## 2017-07-10 MED ORDER — TECHNETIUM TC 99M TETROFOSMIN IV KIT
10.7000 | PACK | Freq: Once | INTRAVENOUS | Status: AC | PRN
  Administered 2017-07-10: 10.7 via INTRAVENOUS
  Filled 2017-07-10: qty 11

## 2017-07-10 MED ORDER — RANITIDINE HCL 300 MG PO TABS: 300.0000 mg | ORAL_TABLET | Freq: Every day | ORAL | 3 refills | Status: DC

## 2017-07-10 MED ORDER — OLMESARTAN MEDOXOMIL 40 MG PO TABS: 40.0000 mg | ORAL_TABLET | Freq: Every day | ORAL | 3 refills | Status: DC

## 2017-07-10 MED ORDER — TECHNETIUM TC 99M TETROFOSMIN IV KIT
31.5000 | PACK | Freq: Once | INTRAVENOUS | Status: AC | PRN
  Administered 2017-07-10: 31.5 via INTRAVENOUS
  Filled 2017-07-10: qty 32

## 2017-07-10 NOTE — Telephone Encounter (Signed)
Copied from [place] [id number]. Topic: Quick Communication - See Telephone Encounter >> Jul 10, 2017  2:07 PM Conception Chancy, NT wrote: CRM for notification. See Telephone encounter for:  07/10/17.  [place] [place] pharmacy is calling and states patient is requesting a refill on olmesartan (BENICAR) 40 MG tablet and  ranitidine (ZANTAC) 300 MG tablet. I informed pharmacist script was last written on 06/26/17. She is requesting a new script because patient wants to get the mediation through them instead of [facility]. Please advise. Fax # [phone] CB# [phone]

## 2017-07-10 NOTE — Telephone Encounter (Signed)
Medication sent to [facility] as requested by pt. Pt states he want to get medication through mail order instead of [facility].

## 2017-09-03 ENCOUNTER — Telehealth: Payer: Self-pay | Admitting: Internal Medicine

## 2017-09-03 MED ORDER — AMLODIPINE BESYLATE 10 MG PO TABS
10.0000 mg | ORAL_TABLET | Freq: Every day | ORAL | 2 refills | Status: DC
Start: 2017-09-03 — End: 2018-09-16

## 2017-09-03 NOTE — Telephone Encounter (Signed)
Copied from [place]. Topic: Quick Communication - Rx Refill/Question >> Sep 03, 2017  9:20 AM Cleaster Corin, NT wrote: Medication: amLODipine (NORVASC) 10 MG tablet [[id number]]  Has the patient contacted their pharmacy?yes (Agent: If no, request that the patient contact the pharmacy for the refill.) Preferred Pharmacy (with phone number or street name):[facility] [place], [address] [place] [address] Phone: [phone] Fax: [phone]   Agent: Please be advised that RX refills may take up to 3 business days. We ask that you follow-up with your pharmacy.

## 2017-09-03 NOTE — Telephone Encounter (Signed)
Medication refills resent to requested mail order pharmacy.  ?

## 2017-12-25 ENCOUNTER — Encounter: Payer: Self-pay | Admitting: Internal Medicine

## 2017-12-25 ENCOUNTER — Ambulatory Visit (INDEPENDENT_AMBULATORY_CARE_PROVIDER_SITE_OTHER): Payer: Medicare HMO | Admitting: Internal Medicine

## 2017-12-25 VITALS — BP 130/82 | HR 49 | Temp 98.5°F | Ht 73.0 in | Wt 221.0 lb

## 2017-12-25 DIAGNOSIS — E559 Vitamin D deficiency, unspecified: Secondary | ICD-10-CM | POA: Diagnosis not present

## 2017-12-25 DIAGNOSIS — R739 Hyperglycemia, unspecified: Secondary | ICD-10-CM | POA: Diagnosis not present

## 2017-12-25 DIAGNOSIS — Z0000 Encounter for general adult medical examination without abnormal findings: Secondary | ICD-10-CM

## 2017-12-25 DIAGNOSIS — E785 Hyperlipidemia, unspecified: Secondary | ICD-10-CM

## 2017-12-25 DIAGNOSIS — E538 Deficiency of other specified B group vitamins: Secondary | ICD-10-CM

## 2017-12-25 DIAGNOSIS — R972 Elevated prostate specific antigen [PSA]: Secondary | ICD-10-CM | POA: Diagnosis not present

## 2017-12-25 DIAGNOSIS — I10 Essential (primary) hypertension: Secondary | ICD-10-CM

## 2017-12-25 MED ORDER — CYANOCOBALAMIN 1000 MCG/ML IJ SOLN
1000.0000 ug | Freq: Once | INTRAMUSCULAR | Status: AC
  Administered 2017-12-25: 1000 ug via INTRAMUSCULAR

## 2017-12-25 MED ORDER — ZOSTER VAC RECOMB ADJUVANTED 50 MCG/0.5ML IM SUSR: 0.5000 mL | Freq: Once | INTRAMUSCULAR | 1 refills | Status: AC

## 2017-12-25 NOTE — Addendum Note (Signed)
Addended by: Karren Cobble on: 12/25/2017 09:46 AM   Modules accepted: Orders

## 2017-12-25 NOTE — Addendum Note (Signed)
Addended by: Karren Cobble on: 12/25/2017 08:40 AM   Modules accepted: Orders

## 2017-12-25 NOTE — Assessment & Plan Note (Signed)
Coreg, Benicar and Amlodipine 

## 2017-12-25 NOTE — Assessment & Plan Note (Signed)
Vit D 

## 2017-12-25 NOTE — Progress Notes (Signed)
Subjective:  Patient ID: [name] [name], male    DOB: [birth year]  Age: 72 y.o. MRN: [id number]  CC: No chief complaint on file.   HPI [name] [name] presents for HTN, GERD, LBP, B12 def f/u  Outpatient Medications Prior to Visit  Medication Sig Dispense Refill  . amLODipine (NORVASC) 10 MG tablet Take 1 tablet (10 mg total) by mouth daily. 90 tablet 2  . B Complex Vitamins (VITAMIN B COMPLEX) TABS Take 1 tablet by mouth daily.    . carvedilol (COREG) 25 MG tablet 1TAKE 1 TABLET (25 MG TOTAL) BY MOUTH 2 (TWO) TIMES DAILY WITH A MEAL. 180 tablet 3  . Cholecalciferol (VITAMIN D3) 2000 units capsule Take 1 capsule (2,000 Units total) by mouth daily. 100 capsule 3  . Cholecalciferol (VITAMIN D3) 50000 units CAPS Take 1 capsule by mouth once a week. 6 capsule 0  . fluticasone (FLONASE) 50 MCG/ACT nasal spray Place 2 sprays into both nostrils daily. 16 g 0  . levocetirizine (XYZAL) 5 MG tablet Take 1 tablet (5 mg total) by mouth every evening. 30 tablet 3  . Multiple Vitamins-Minerals (SENIOR MULTIVITAMIN PLUS) TABS Take 1 tablet by mouth daily.    Marland Kitchen olmesartan (BENICAR) 40 MG tablet Take 1 tablet (40 mg total) by mouth daily. 90 tablet 3  . ranitidine (ZANTAC) 300 MG tablet Take 1 tablet (300 mg total) by mouth at bedtime. 90 tablet 3  . sildenafil (REVATIO) 20 MG tablet 1-5 tablets prn 60 tablet 3   Facility-Administered Medications Prior to Visit  Medication Dose Route Frequency Provider Last Rate Last Dose  . 0.9 %  sodium chloride infusion  500 mL Intravenous Continuous Irene Shipper, MD        ROS: Review of Systems  Constitutional: Negative for appetite change, fatigue and unexpected weight change.  HENT: Negative for congestion, nosebleeds, sneezing, sore throat and trouble swallowing.   Eyes: Negative for itching and visual disturbance.  Respiratory: Negative for cough.   Cardiovascular: Negative for chest pain, palpitations and leg swelling.  Gastrointestinal: Negative for  abdominal distention, blood in stool, diarrhea and nausea.  Genitourinary: Negative for frequency and hematuria.  Musculoskeletal: Negative for back pain, gait problem, joint swelling and neck pain.  Skin: Negative for rash.  Neurological: Negative for dizziness, tremors, speech difficulty and weakness.  Psychiatric/Behavioral: Negative for agitation, dysphoric mood, sleep disturbance and suicidal ideas. The patient is not nervous/anxious.     Objective:  BP 130/82 (BP Location: Left Arm, Patient Position: Sitting, Cuff Size: Large)   Pulse (!) 49   Temp 98.5 F (36.9 C) (Oral)   Ht 6\' 1"  (1.854 m)   Wt 221 lb (100.2 kg)   SpO2 97%   BMI 29.16 kg/m   BP Readings from Last 3 Encounters:  12/25/17 130/82  06/26/17 124/68  05/09/17 (!) 148/82    Wt Readings from Last 3 Encounters:  12/25/17 221 lb (100.2 kg)  07/10/17 218 lb (98.9 kg)  06/26/17 218 lb (98.9 kg)    Physical Exam  Constitutional: He is oriented to person, place, and time. He appears well-developed. No distress.  NAD  HENT:  Mouth/Throat: Oropharynx is clear and moist.  Eyes: Pupils are equal, round, and reactive to light. Conjunctivae are normal.  Neck: Normal range of motion. No JVD present. No thyromegaly present.  Cardiovascular: Normal rate, regular rhythm, normal heart sounds and intact distal pulses. Exam reveals no gallop and no friction rub.  No murmur heard. Pulmonary/Chest: Effort normal and  breath sounds normal. No respiratory distress. He has no wheezes. He has no rales. He exhibits no tenderness.  Abdominal: Soft. Bowel sounds are normal. He exhibits no distension and no mass. There is no tenderness. There is no rebound and no guarding.  Musculoskeletal: Normal range of motion. He exhibits no edema or tenderness.  Lymphadenopathy:    He has no cervical adenopathy.  Neurological: He is alert and oriented to person, place, and time. He has normal reflexes. No cranial nerve deficit. He exhibits  normal muscle tone. He displays a negative Romberg sign. Coordination and gait normal.  Skin: Skin is warm and dry. No rash noted.  Psychiatric: He has a normal mood and affect. His behavior is normal. Judgment and thought content normal.    Lab Results  Component Value Date   WBC 7.3 03/01/2017   HGB 15.3 03/01/2017   HCT 45.4 03/01/2017   PLT 200.0 03/01/2017   GLUCOSE 126 (H) 06/26/2017   CHOL 181 03/01/2017   TRIG 164.0 (H) 03/01/2017   HDL 36.00 (L) 03/01/2017   LDLDIRECT 134.0 02/08/2012   LDLCALC 113 (H) 03/01/2017   ALT 18 03/01/2017   AST 18 03/01/2017   NA 143 06/26/2017   K 4.1 06/26/2017   CL 106 06/26/2017   CREATININE 0.88 06/26/2017   BUN 13 06/26/2017   CO2 29 06/26/2017   TSH 2.16 03/01/2017   PSA 0.97 03/01/2017   HGBA1C 5.6 05/08/2006    Dg Chest 2 View  Result Date: 05/30/2013 CLINICAL DATA:  Cough EXAM: CHEST  2 VIEW COMPARISON:  02/27/2012 FINDINGS: Bandlike lingula density compatible with atelectasis or scarring. Normal heart size. Stable prominent hilar vascularity. No edema pattern or pneumonia. No collapse or consolidation. Negative for effusion or pneumothorax. Trachea midline. IMPRESSION: Lingula atelectasis versus scarring. Otherwise stable exam without acute process Electronically Signed   By: Daryll Brod M.D.   On: 05/30/2013 11:20    Assessment & Plan:   There are no diagnoses linked to this encounter.   No orders of the defined types were placed in this encounter.    Follow-up: No follow-ups on file.  Walker Kehr, MD

## 2017-12-25 NOTE — Assessment & Plan Note (Signed)
On B12 

## 2017-12-25 NOTE — Assessment & Plan Note (Signed)
A1c

## 2018-03-06 NOTE — Progress Notes (Signed)
Subjective:    Patient ID: [name] [name], male    DOB: 18-Sep-1945, 72 y.o.   MRN: [id number]  HPI The patient is here for an acute visit for chest pain related to coughing and congestion.Marland Kitchen  His symptoms started about one month ago, but they have waxed and waned over the past month.  Initially he thought they were allergies.  He smokes cigars and some cigarettes.   He is experiencing primarily a dry cough, but occasionally he will spit up some sputum, occasional wheeze, mild nasal congestion at night and a sore throat.  His glands are swollen in his neck and he has slight headaches.  He denies fever, chills, sinus pain, SOB and body aches.   He has tried taking cough syrup and an allergy pill, but he stopped both of those.      Medications and allergies reviewed with patient and updated if appropriate.  Patient Active Problem List   Diagnosis Date Noted  . Hyperglycemia 12/25/2017  . Vitamin D deficiency 06/26/2017  . URI (upper respiratory infection) 05/09/2017  . Leg swelling 07/11/2016  . Colon polyp 06/25/2016  . Allergic conjunctivitis 02/11/2016  . Chest pain, atypical 06/22/2015  . Scalp pain 06/01/2015  . History of colonic polyps 09/15/2014  . Prostatitis 09/15/2014  . Rash and nonspecific skin eruption 11/23/2013  . Conjunctivitis, left eye 10/15/2013  . Asthmatic bronchitis 09/09/2013  . Allergic rhinitis 09/09/2013  . Well adult exam 09/03/2012  . Elevated PSA 09/03/2012  . Erectile dysfunction 06/04/2012  . Fall due to wet surface 08/15/2011  . Neck pain 08/15/2011  . Concussion 08/15/2011  . Headache(784.0) 05/16/2009  . SHOULDER PAIN, RIGHT 04/16/2009  . BENIGN PROSTATIC HYPERTROPHY 03/30/2009  . LOW BACK PAIN 03/30/2009  . Dyslipidemia 10/16/2007  . Essential hypertension 06/18/2007  . FREQUENCY, URINARY 06/18/2007  . B12 deficiency 05/19/2007    Current Outpatient Medications on File Prior to Visit  Medication Sig Dispense Refill  . amLODipine  (NORVASC) 10 MG tablet Take 1 tablet (10 mg total) by mouth daily. 90 tablet 2  . B Complex Vitamins (VITAMIN B COMPLEX) TABS Take 1 tablet by mouth daily.    . carvedilol (COREG) 25 MG tablet 1TAKE 1 TABLET (25 MG TOTAL) BY MOUTH 2 (TWO) TIMES DAILY WITH A MEAL. 180 tablet 3  . Cholecalciferol (VITAMIN D3) 2000 units capsule Take 1 capsule (2,000 Units total) by mouth daily. 100 capsule 3  . Cholecalciferol (VITAMIN D3) 50000 units CAPS Take 1 capsule by mouth once a week. 6 capsule 0  . fluticasone (FLONASE) 50 MCG/ACT nasal spray Place 2 sprays into both nostrils daily. 16 g 0  . levocetirizine (XYZAL) 5 MG tablet Take 1 tablet (5 mg total) by mouth every evening. 30 tablet 3  . Multiple Vitamins-Minerals (SENIOR MULTIVITAMIN PLUS) TABS Take 1 tablet by mouth daily.    Marland Kitchen olmesartan (BENICAR) 40 MG tablet Take 1 tablet (40 mg total) by mouth daily. 90 tablet 3  . ranitidine (ZANTAC) 300 MG tablet Take 1 tablet (300 mg total) by mouth at bedtime. 90 tablet 3  . sildenafil (REVATIO) 20 MG tablet 1-5 tablets prn 60 tablet 3  . [DISCONTINUED] spironolactone (ALDACTONE) 50 MG tablet Take 50 mg by mouth daily. For blood pressure      Current Facility-Administered Medications on File Prior to Visit  Medication Dose Route Frequency Provider Last Rate Last Dose  . 0.9 %  sodium chloride infusion  500 mL Intravenous Continuous Irene Shipper, MD  Past Medical History:  Diagnosis Date  . Allergy   . Arthritis   . BPH (benign prostatic hyperplasia)   . ED (erectile dysfunction)   . GERD (gastroesophageal reflux disease)   . Heart murmur    hx  . Hyperlipidemia   . Hypertension   . LBP (low back pain)   . PONV (postoperative nausea and vomiting)    with epidural inj  . Vitamin B 12 deficiency     Past Surgical History:  Procedure Laterality Date  . BACK SURGERY     fusion of lower back   . COLONOSCOPY    . KNEE ARTHROSCOPY  02   Right  . POLYPECTOMY      Social History    Socioeconomic History  . Marital status: Single    Spouse name: Not on file  . Number of children: Not on file  . Years of education: Not on file  . Highest education level: Not on file  Occupational History  . Occupation: DRIVER    Employer: [employer]    Comment: night shift  Social Needs  . Financial resource strain: Not hard at all  . Food insecurity:    Worry: Never true    Inability: Never true  . Transportation needs:    Medical: No    Non-medical: No  Tobacco Use  . Smoking status: Current Some Day Smoker    Packs/day: 0.25    Years: 41.00    Pack years: 10.25    Types: Cigars, Cigarettes  . Smokeless tobacco: Never Used  . Tobacco comment: 1-2/d  occ smoke   occ drink liquor  Substance and Sexual Activity  . Alcohol use: Yes    Comment: Occasional  . Drug use: No  . Sexual activity: Yes  Lifestyle  . Physical activity:    Days per week: 0 days    Minutes per session: 0 min  . Stress: Not at all  Relationships  . Social connections:    Talks on phone: More than three times a week    Gets together: More than three times a week    Attends religious service: Not on file    Active member of club or organization: Not on file    Attends meetings of clubs or organizations: Not on file    Relationship status: Living with partner  Other Topics Concern  . Not on file  Social History Narrative   Family history of hypertension    Family History  Problem Relation Age of Onset  . Cirrhosis Mother   . Alcohol abuse Mother   . Cirrhosis Father   . Alcohol abuse Father   . Heart disease Sister 29       MI  . Colon cancer Neg Hx   . Colon polyps Neg Hx   . Esophageal cancer Neg Hx   . Rectal cancer Neg Hx   . Stomach cancer Neg Hx     Review of Systems  Constitutional: Negative for appetite change, chills and fever.  HENT: Positive for congestion (at night) and sore throat. Negative for ear pain, postnasal drip, sinus pressure and sinus pain.    Respiratory: Positive for cough (dry, occ spits up sputum) and wheezing (occasional). Negative for chest tightness and shortness of breath.   Cardiovascular: Negative for chest pain.  Gastrointestinal: Negative for nausea.  Musculoskeletal: Negative for myalgias.  Neurological: Positive for headaches (slight). Negative for dizziness and light-headedness.       Objective:   Vitals:  03/07/18 0911  BP: 136/80  Pulse: (!) 51  Resp: 16  Temp: 97.7 F (36.5 C)  SpO2: 98%   BP Readings from Last 3 Encounters:  03/07/18 136/80  12/25/17 130/82  06/26/17 124/68   Wt Readings from Last 3 Encounters:  03/07/18 222 lb 12.8 oz (101.1 kg)  12/25/17 221 lb (100.2 kg)  07/10/17 218 lb (98.9 kg)   Body mass index is 29.39 kg/m.   Physical Exam    GENERAL APPEARANCE: Appears stated age, well appearing, NAD EYES: conjunctiva clear, no icterus HEENT: bilateral tympanic membranes and ear canals normal, oropharynx with no erythema, no thyromegaly, trachea midline, mildly swollen and tender cervical lymphadenopathy, no other lymphadenopathy LUNGS: Clear to auscultation without wheeze or crackles, unlabored breathing, good air entry bilaterally CARDIOVASCULAR: Normal S1,S2 without murmurs, no edema SKIN: Warm, dry      Assessment & Plan:    See Problem List for Assessment and Plan of chronic medical problems.

## 2018-03-07 ENCOUNTER — Encounter: Payer: Self-pay | Admitting: Internal Medicine

## 2018-03-07 ENCOUNTER — Ambulatory Visit (INDEPENDENT_AMBULATORY_CARE_PROVIDER_SITE_OTHER): Payer: PRIVATE HEALTH INSURANCE | Admitting: Internal Medicine

## 2018-03-07 VITALS — BP 136/80 | HR 51 | Temp 97.7°F | Resp 16 | Ht 73.0 in | Wt 222.8 lb

## 2018-03-07 DIAGNOSIS — Z23 Encounter for immunization: Secondary | ICD-10-CM | POA: Diagnosis not present

## 2018-03-07 DIAGNOSIS — J301 Allergic rhinitis due to pollen: Secondary | ICD-10-CM | POA: Diagnosis not present

## 2018-03-07 MED ORDER — LEVOCETIRIZINE DIHYDROCHLORIDE 5 MG PO TABS: 5.0000 mg | ORAL_TABLET | Freq: Every evening | ORAL | 5 refills | Status: DC

## 2018-03-07 MED ORDER — LEVOCETIRIZINE DIHYDROCHLORIDE 5 MG PO TABS
5.0000 mg | ORAL_TABLET | Freq: Every evening | ORAL | 3 refills | Status: DC
Start: 2018-03-07 — End: 2018-03-07

## 2018-03-07 NOTE — Patient Instructions (Signed)
Restart your allergy medication -- it was sent to your pharmacy.  Stop smoking.    You can take any over the counter cold medications if needed for your symptoms.    Call if no improvement

## 2018-03-07 NOTE — Addendum Note (Signed)
Addended by: Delice Bison E on: 03/07/2018 10:30 AM   Modules accepted: Orders

## 2018-03-07 NOTE — Assessment & Plan Note (Signed)
Most of his symptoms are likely related to allergies He looks well and his exam in unremarkable Restart allergy med - xyzal sent to pharmacy Can use nasal spray Advised him to stop smoking

## 2018-04-24 ENCOUNTER — Other Ambulatory Visit (INDEPENDENT_AMBULATORY_CARE_PROVIDER_SITE_OTHER): Payer: Medicare HMO

## 2018-04-24 DIAGNOSIS — E538 Deficiency of other specified B group vitamins: Secondary | ICD-10-CM | POA: Diagnosis not present

## 2018-04-24 DIAGNOSIS — R739 Hyperglycemia, unspecified: Secondary | ICD-10-CM

## 2018-04-24 DIAGNOSIS — R972 Elevated prostate specific antigen [PSA]: Secondary | ICD-10-CM | POA: Diagnosis not present

## 2018-04-24 DIAGNOSIS — Z0000 Encounter for general adult medical examination without abnormal findings: Secondary | ICD-10-CM | POA: Diagnosis not present

## 2018-04-24 DIAGNOSIS — E785 Hyperlipidemia, unspecified: Secondary | ICD-10-CM

## 2018-04-24 LAB — CBC WITH DIFFERENTIAL/PLATELET
BASOS PCT: 0.4 % (ref 0.0–3.0)
Basophils Absolute: 0 10*3/uL (ref 0.0–0.1)
EOS ABS: 0.2 10*3/uL (ref 0.0–0.7)
Eosinophils Relative: 2.4 % (ref 0.0–5.0)
HEMATOCRIT: 45 % (ref 39.0–52.0)
Hemoglobin: 15.4 g/dL (ref 13.0–17.0)
LYMPHS ABS: 2.5 10*3/uL (ref 0.7–4.0)
Lymphocytes Relative: 32.3 % (ref 12.0–46.0)
MCHC: 34.2 g/dL (ref 30.0–36.0)
MCV: 86.7 fl (ref 78.0–100.0)
Monocytes Absolute: 1 10*3/uL (ref 0.1–1.0)
Monocytes Relative: 13 % — ABNORMAL HIGH (ref 3.0–12.0)
NEUTROS PCT: 51.9 % (ref 43.0–77.0)
Neutro Abs: 4 10*3/uL (ref 1.4–7.7)
PLATELETS: 211 10*3/uL (ref 150.0–400.0)
RBC: 5.18 Mil/uL (ref 4.22–5.81)
RDW: 13.7 % (ref 11.5–15.5)
WBC: 7.8 10*3/uL (ref 4.0–10.5)

## 2018-04-24 LAB — BASIC METABOLIC PANEL
BUN: 14 mg/dL (ref 6–23)
CHLORIDE: 103 meq/L (ref 96–112)
CO2: 30 meq/L (ref 19–32)
Calcium: 10.1 mg/dL (ref 8.4–10.5)
Creatinine, Ser: 0.88 mg/dL (ref 0.40–1.50)
GFR: 109.79 mL/min (ref >60.00)
GLUCOSE: 75 mg/dL (ref 70–99)
Potassium: 4.4 mEq/L (ref 3.5–5.1)
SODIUM: 141 meq/L (ref 135–145)

## 2018-04-24 LAB — HEPATIC FUNCTION PANEL
ALBUMIN: 4.5 g/dL (ref 3.5–5.2)
ALK PHOS: 100 U/L (ref 39–117)
ALT: 20 U/L (ref 0–53)
AST: 17 U/L (ref 0–37)
BILIRUBIN TOTAL: 0.6 mg/dL (ref 0.2–1.2)
Bilirubin, Direct: 0.1 mg/dL (ref 0.0–0.3)
Total Protein: 7.3 g/dL (ref 6.0–8.3)

## 2018-04-24 LAB — URINALYSIS
Bilirubin Urine: NEGATIVE
Hgb urine dipstick: NEGATIVE
KETONES UR: NEGATIVE
Leukocytes, UA: NEGATIVE
Nitrite: NEGATIVE
SPECIFIC GRAVITY, URINE: 1.015 (ref 1.000–1.030)
Total Protein, Urine: NEGATIVE
URINE GLUCOSE: NEGATIVE
UROBILINOGEN UA: 0.2 (ref 0.0–1.0)
pH: 6 (ref 5.0–8.0)

## 2018-04-24 LAB — LIPID PANEL
CHOLESTEROL: 164 mg/dL (ref 0–200)
HDL: 38.5 mg/dL — AB (ref >39.00)
LDL CALC: 95 mg/dL (ref 0–99)
NonHDL: 125.03
Total CHOL/HDL Ratio: 4
Triglycerides: 152 mg/dL — ABNORMAL HIGH (ref 0.0–149.0)
VLDL: 30.4 mg/dL (ref 0.0–40.0)

## 2018-04-24 LAB — PSA: PSA: 0.95 ng/mL (ref 0.10–4.00)

## 2018-04-24 LAB — VITAMIN B12: VITAMIN B 12: 570 pg/mL (ref 211–911)

## 2018-04-24 LAB — HEMOGLOBIN A1C: HEMOGLOBIN A1C: 5.3 % (ref 4.6–6.5)

## 2018-04-24 LAB — TSH: TSH: 2.04 u[IU]/mL (ref 0.35–4.50)

## 2018-04-28 ENCOUNTER — Ambulatory Visit (INDEPENDENT_AMBULATORY_CARE_PROVIDER_SITE_OTHER): Payer: Medicare HMO | Admitting: Internal Medicine

## 2018-04-28 ENCOUNTER — Encounter: Payer: Self-pay | Admitting: Internal Medicine

## 2018-04-28 VITALS — BP 126/78 | HR 61 | Temp 97.8°F | Ht 73.0 in | Wt 224.0 lb

## 2018-04-28 DIAGNOSIS — E538 Deficiency of other specified B group vitamins: Secondary | ICD-10-CM

## 2018-04-28 DIAGNOSIS — E785 Hyperlipidemia, unspecified: Secondary | ICD-10-CM | POA: Diagnosis not present

## 2018-04-28 DIAGNOSIS — I10 Essential (primary) hypertension: Secondary | ICD-10-CM

## 2018-04-28 DIAGNOSIS — E559 Vitamin D deficiency, unspecified: Secondary | ICD-10-CM

## 2018-04-28 NOTE — Progress Notes (Deleted)
Subjective:  Patient ID: [name] [name], male    DOB: 03-05-1946  Age: 72 y.o. MRN: [id number]  CC: No chief complaint on file.   HPI [name] [name] presents for ***  Outpatient Medications Prior to Visit  Medication Sig Dispense Refill  . amLODipine (NORVASC) 10 MG tablet Take 1 tablet (10 mg total) by mouth daily. 90 tablet 2  . B Complex Vitamins (VITAMIN B COMPLEX) TABS Take 1 tablet by mouth daily.    . carvedilol (COREG) 25 MG tablet 1TAKE 1 TABLET (25 MG TOTAL) BY MOUTH 2 (TWO) TIMES DAILY WITH A MEAL. 180 tablet 3  . Cholecalciferol (VITAMIN D3) 2000 units capsule Take 1 capsule (2,000 Units total) by mouth daily. 100 capsule 3  . Cholecalciferol (VITAMIN D3) 50000 units CAPS Take 1 capsule by mouth once a week. 6 capsule 0  . fluticasone (FLONASE) 50 MCG/ACT nasal spray Place 2 sprays into both nostrils daily. 16 g 0  . levocetirizine (XYZAL) 5 MG tablet Take 1 tablet (5 mg total) by mouth every evening. 30 tablet 5  . Multiple Vitamins-Minerals (SENIOR MULTIVITAMIN PLUS) TABS Take 1 tablet by mouth daily.    Marland Kitchen olmesartan (BENICAR) 40 MG tablet Take 1 tablet (40 mg total) by mouth daily. 90 tablet 3  . ranitidine (ZANTAC) 300 MG tablet Take 1 tablet (300 mg total) by mouth at bedtime. 90 tablet 3  . sildenafil (REVATIO) 20 MG tablet 1-5 tablets prn 60 tablet 3   Facility-Administered Medications Prior to Visit  Medication Dose Route Frequency Provider Last Rate Last Dose  . 0.9 %  sodium chloride infusion  500 mL Intravenous Continuous Irene Shipper, MD        ROS: Review of Systems  Objective:  BP 126/78 (BP Location: Left Arm, Patient Position: Sitting, Cuff Size: Large)   Pulse 61   Temp 97.8 F (36.6 C) (Oral)   Ht 6\' 1"  (1.854 m)   Wt 224 lb (101.6 kg)   SpO2 97%   BMI 29.55 kg/m   BP Readings from Last 3 Encounters:  04/28/18 126/78  03/07/18 136/80  12/25/17 130/82    Wt Readings from Last 3 Encounters:  04/28/18 224 lb (101.6 kg)  03/07/18 222 lb  12.8 oz (101.1 kg)  12/25/17 221 lb (100.2 kg)    Physical Exam  Lab Results  Component Value Date   WBC 7.8 04/24/2018   HGB 15.4 04/24/2018   HCT 45.0 04/24/2018   PLT 211.0 04/24/2018   GLUCOSE 75 04/24/2018   CHOL 164 04/24/2018   TRIG 152.0 (H) 04/24/2018   HDL 38.50 (L) 04/24/2018   LDLDIRECT 134.0 02/08/2012   LDLCALC 95 04/24/2018   ALT 20 04/24/2018   AST 17 04/24/2018   NA 141 04/24/2018   K 4.4 04/24/2018   CL 103 04/24/2018   CREATININE 0.88 04/24/2018   BUN 14 04/24/2018   CO2 30 04/24/2018   TSH 2.04 04/24/2018   PSA 0.95 04/24/2018   HGBA1C 5.3 04/24/2018    Dg Chest 2 View  Result Date: 05/30/2013 CLINICAL DATA:  Cough EXAM: CHEST  2 VIEW COMPARISON:  02/27/2012 FINDINGS: Bandlike lingula density compatible with atelectasis or scarring. Normal heart size. Stable prominent hilar vascularity. No edema pattern or pneumonia. No collapse or consolidation. Negative for effusion or pneumothorax. Trachea midline. IMPRESSION: Lingula atelectasis versus scarring. Otherwise stable exam without acute process Electronically Signed   By: Daryll Brod M.D.   On: 05/30/2013 11:20    Assessment & Plan:  Diagnoses and all orders for this visit:  Essential hypertension  B12 deficiency  Dyslipidemia  Vitamin D deficiency     No orders of the defined types were placed in this encounter.    Follow-up: Return in about 6 months (around 10/28/2018) for Wellness Exam.  Walker Kehr, MD

## 2018-04-28 NOTE — Progress Notes (Signed)
Subjective:  Patient ID: [name] [name], male    DOB: [birth year]  Age: 72 y.o. MRN: [id number]  CC: No chief complaint on file.   HPI KARLA VINES presents for HTN, ED, B12 def f/u  Outpatient Medications Prior to Visit  Medication Sig Dispense Refill  . amLODipine (NORVASC) 10 MG tablet Take 1 tablet (10 mg total) by mouth daily. 90 tablet 2  . B Complex Vitamins (VITAMIN B COMPLEX) TABS Take 1 tablet by mouth daily.    . carvedilol (COREG) 25 MG tablet 1TAKE 1 TABLET (25 MG TOTAL) BY MOUTH 2 (TWO) TIMES DAILY WITH A MEAL. 180 tablet 3  . Cholecalciferol (VITAMIN D3) 2000 units capsule Take 1 capsule (2,000 Units total) by mouth daily. 100 capsule 3  . Cholecalciferol (VITAMIN D3) 50000 units CAPS Take 1 capsule by mouth once a week. 6 capsule 0  . fluticasone (FLONASE) 50 MCG/ACT nasal spray Place 2 sprays into both nostrils daily. 16 g 0  . levocetirizine (XYZAL) 5 MG tablet Take 1 tablet (5 mg total) by mouth every evening. 30 tablet 5  . Multiple Vitamins-Minerals (SENIOR MULTIVITAMIN PLUS) TABS Take 1 tablet by mouth daily.    Marland Kitchen olmesartan (BENICAR) 40 MG tablet Take 1 tablet (40 mg total) by mouth daily. 90 tablet 3  . ranitidine (ZANTAC) 300 MG tablet Take 1 tablet (300 mg total) by mouth at bedtime. 90 tablet 3  . sildenafil (REVATIO) 20 MG tablet 1-5 tablets prn 60 tablet 3   Facility-Administered Medications Prior to Visit  Medication Dose Route Frequency Provider Last Rate Last Dose  . 0.9 %  sodium chloride infusion  500 mL Intravenous Continuous Irene Shipper, MD        ROS: Review of Systems  Constitutional: Negative for appetite change, fatigue and unexpected weight change.  HENT: Negative for congestion, nosebleeds, sneezing, sore throat and trouble swallowing.   Eyes: Negative for itching and visual disturbance.  Respiratory: Negative for cough.   Cardiovascular: Negative for chest pain, palpitations and leg swelling.  Gastrointestinal: Negative for abdominal  distention, blood in stool, diarrhea and nausea.  Genitourinary: Negative for frequency and hematuria.  Musculoskeletal: Negative for back pain, gait problem, joint swelling and neck pain.  Skin: Negative for rash.  Neurological: Negative for dizziness, tremors, speech difficulty and weakness.  Psychiatric/Behavioral: Negative for agitation, dysphoric mood and sleep disturbance. The patient is not nervous/anxious.     Objective:  BP 126/78 (BP Location: Left Arm, Patient Position: Sitting, Cuff Size: Large)   Pulse 61   Temp 97.8 F (36.6 C) (Oral)   Ht 6\' 1"  (1.854 m)   Wt 224 lb (101.6 kg)   SpO2 97%   BMI 29.55 kg/m   BP Readings from Last 3 Encounters:  04/28/18 126/78  03/07/18 136/80  12/25/17 130/82    Wt Readings from Last 3 Encounters:  04/28/18 224 lb (101.6 kg)  03/07/18 222 lb 12.8 oz (101.1 kg)  12/25/17 221 lb (100.2 kg)    Physical Exam  Constitutional: He is oriented to person, place, and time. He appears well-developed. No distress.  NAD  HENT:  Mouth/Throat: Oropharynx is clear and moist.  Eyes: Pupils are equal, round, and reactive to light. Conjunctivae are normal.  Neck: Normal range of motion. No JVD present. No thyromegaly present.  Cardiovascular: Normal rate, regular rhythm, normal heart sounds and intact distal pulses. Exam reveals no gallop and no friction rub.  No murmur heard. Pulmonary/Chest: Effort normal and breath sounds normal.  No respiratory distress. He has no wheezes. He has no rales. He exhibits no tenderness.  Abdominal: Soft. Bowel sounds are normal. He exhibits no distension and no mass. There is no tenderness. There is no rebound and no guarding.  Musculoskeletal: Normal range of motion. He exhibits no edema or tenderness.  Lymphadenopathy:    He has no cervical adenopathy.  Neurological: He is alert and oriented to person, place, and time. He has normal reflexes. No cranial nerve deficit. He exhibits normal muscle tone. He  displays a negative Romberg sign. Coordination and gait normal.  Skin: Skin is warm and dry. No rash noted.  Psychiatric: He has a normal mood and affect. His behavior is normal. Judgment and thought content normal.    Lab Results  Component Value Date   WBC 7.8 04/24/2018   HGB 15.4 04/24/2018   HCT 45.0 04/24/2018   PLT 211.0 04/24/2018   GLUCOSE 75 04/24/2018   CHOL 164 04/24/2018   TRIG 152.0 (H) 04/24/2018   HDL 38.50 (L) 04/24/2018   LDLDIRECT 134.0 02/08/2012   LDLCALC 95 04/24/2018   ALT 20 04/24/2018   AST 17 04/24/2018   NA 141 04/24/2018   K 4.4 04/24/2018   CL 103 04/24/2018   CREATININE 0.88 04/24/2018   BUN 14 04/24/2018   CO2 30 04/24/2018   TSH 2.04 04/24/2018   PSA 0.95 04/24/2018   HGBA1C 5.3 04/24/2018    Dg Chest 2 View  Result Date: 05/30/2013 CLINICAL DATA:  Cough EXAM: CHEST  2 VIEW COMPARISON:  02/27/2012 FINDINGS: Bandlike lingula density compatible with atelectasis or scarring. Normal heart size. Stable prominent hilar vascularity. No edema pattern or pneumonia. No collapse or consolidation. Negative for effusion or pneumothorax. Trachea midline. IMPRESSION: Lingula atelectasis versus scarring. Otherwise stable exam without acute process Electronically Signed   By: Daryll Brod M.D.   On: 05/30/2013 11:20    Assessment & Plan:       Follow-up: Return in about 6 months (around 10/28/2018) for Wellness Exam.  Walker Kehr, MD

## 2018-04-28 NOTE — Assessment & Plan Note (Signed)
cardiac CT scan for calcium scoring offered 

## 2018-04-28 NOTE — Patient Instructions (Signed)

## 2018-04-28 NOTE — Assessment & Plan Note (Signed)
Vit D 

## 2018-04-28 NOTE — Assessment & Plan Note (Signed)
On B12 Risks associated with treatment noncompliance were discussed. Compliance was encouraged.

## 2018-04-28 NOTE — Assessment & Plan Note (Signed)
Coreg, Benicar and Amlodipine 

## 2018-05-02 ENCOUNTER — Ambulatory Visit: Payer: Self-pay | Admitting: Family Medicine

## 2018-06-25 ENCOUNTER — Other Ambulatory Visit: Payer: Self-pay | Admitting: Internal Medicine

## 2018-06-25 DIAGNOSIS — I10 Essential (primary) hypertension: Secondary | ICD-10-CM

## 2018-07-12 ENCOUNTER — Encounter: Payer: Self-pay | Admitting: Internal Medicine

## 2018-07-12 ENCOUNTER — Ambulatory Visit (INDEPENDENT_AMBULATORY_CARE_PROVIDER_SITE_OTHER): Payer: Medicare HMO | Admitting: Internal Medicine

## 2018-07-12 VITALS — BP 144/82 | HR 67 | Temp 97.9°F | Ht 73.0 in | Wt 224.0 lb

## 2018-07-12 DIAGNOSIS — L308 Other specified dermatitis: Secondary | ICD-10-CM

## 2018-07-12 MED ORDER — CLOBETASOL PROPIONATE 0.05 % EX CREA: 1.0000 "application " | TOPICAL_CREAM | Freq: Two times a day (BID) | CUTANEOUS | 0 refills | Status: DC

## 2018-07-12 NOTE — Progress Notes (Signed)
Subjective:    Patient ID: [name] [name], male    DOB: 1945-11-12, 73 y.o.   MRN: [id number]  HPI  Pt presents to the clinic today with c/o a rash to his abdomen, bilateral axilla and bilateral upper thighs. He reports this started 1 week ago. The rash is itchy. He denies recent changes in diet or medications. He denies changes in soaps, lotions or detergents. No one in his home has had a similar rash. He reports he has had shingles in the past but this is different. He has been using his SO's Clobetasol cream with good relief.  Review of Systems      Past Medical History:  Diagnosis Date  . Allergy   . Arthritis   . BPH (benign prostatic hyperplasia)   . ED (erectile dysfunction)   . GERD (gastroesophageal reflux disease)   . Heart murmur    hx  . Hyperlipidemia   . Hypertension   . LBP (low back pain)   . PONV (postoperative nausea and vomiting)    with epidural inj  . Vitamin B 12 deficiency     Current Outpatient Medications  Medication Sig Dispense Refill  . amLODipine (NORVASC) 10 MG tablet Take 1 tablet (10 mg total) by mouth daily. 90 tablet 2  . B Complex Vitamins (VITAMIN B COMPLEX) TABS Take 1 tablet by mouth daily.    . carvedilol (COREG) 25 MG tablet 1TAKE 1 TABLET (25 MG TOTAL) BY MOUTH 2 (TWO) TIMES DAILY WITH A MEAL. 180 tablet 3  . Cholecalciferol (VITAMIN D3) 2000 units capsule Take 1 capsule (2,000 Units total) by mouth daily. 100 capsule 3  . Cholecalciferol (VITAMIN D3) 50000 units CAPS Take 1 capsule by mouth once a week. 6 capsule 0  . fluticasone (FLONASE) 50 MCG/ACT nasal spray Place 2 sprays into both nostrils daily. 16 g 0  . levocetirizine (XYZAL) 5 MG tablet Take 1 tablet (5 mg total) by mouth every evening. 30 tablet 5  . Multiple Vitamins-Minerals (SENIOR MULTIVITAMIN PLUS) TABS Take 1 tablet by mouth daily.    Marland Kitchen olmesartan (BENICAR) 40 MG tablet TAKE 1 TABLET (40 MG TOTAL) BY MOUTH DAILY. 90 tablet 3  . ranitidine (ZANTAC) 300 MG tablet Take 1  tablet (300 mg total) by mouth at bedtime. 90 tablet 3  . sildenafil (REVATIO) 20 MG tablet 1-5 tablets prn 60 tablet 3   Current Facility-Administered Medications  Medication Dose Route Frequency Provider Last Rate Last Dose  . 0.9 %  sodium chloride infusion  500 mL Intravenous Continuous Irene Shipper, MD        Allergies  Allergen Reactions  . Hydrochlorothiazide     REACTION: HA, eyes hurt    Family History  Problem Relation Age of Onset  . Cirrhosis Mother   . Alcohol abuse Mother   . Cirrhosis Father   . Alcohol abuse Father   . Heart disease Sister 92       MI  . Colon cancer Neg Hx   . Colon polyps Neg Hx   . Esophageal cancer Neg Hx   . Rectal cancer Neg Hx   . Stomach cancer Neg Hx     Social History   Socioeconomic History  . Marital status: Single    Spouse name: Not on file  . Number of children: Not on file  . Years of education: Not on file  . Highest education level: Not on file  Occupational History  . Occupation: DRIVER  Employer: [employer]    Comment: night shift  Social Needs  . Financial resource strain: Not hard at all  . Food insecurity:    Worry: Never true    Inability: Never true  . Transportation needs:    Medical: No    Non-medical: No  Tobacco Use  . Smoking status: Current Some Day Smoker    Packs/day: 0.25    Years: 41.00    Pack years: 10.25    Types: Cigars, Cigarettes  . Smokeless tobacco: Never Used  . Tobacco comment: 1-2/d  occ smoke   occ drink liquor  Substance and Sexual Activity  . Alcohol use: Yes    Comment: Occasional  . Drug use: No  . Sexual activity: Yes  Lifestyle  . Physical activity:    Days per week: 0 days    Minutes per session: 0 min  . Stress: Not at all  Relationships  . Social connections:    Talks on phone: More than three times a week    Gets together: More than three times a week    Attends religious service: Not on file    Active member of club or organization: Not on file     Attends meetings of clubs or organizations: Not on file    Relationship status: Living with partner  . Intimate partner violence:    Fear of current or ex partner: No    Emotionally abused: No    Physically abused: No    Forced sexual activity: No  Other Topics Concern  . Not on file  Social History Narrative   Family history of hypertension     Constitutional: Denies fever, malaise, fatigue, headache or abrupt weight changes.  Skin: Pt reports rash. Denies ulcercations.   No other specific complaints in a complete review of systems (except as listed in HPI above).  Objective:   Physical Exam   BP (!) 144/82 Comment: meds not taken yet  Pulse 67   Temp 97.9 F (36.6 C) (Oral)   Ht 6\' 1"  (1.854 m)   Wt 224 lb (101.6 kg)   SpO2 98%   BMI 29.55 kg/m  Wt Readings from Last 3 Encounters:  07/12/18 224 lb (101.6 kg)  04/28/18 224 lb (101.6 kg)  03/07/18 222 lb 12.8 oz (101.1 kg)    General: Appears his stated age, well developed, well nourished in NAD. Skin: Excoriated maculopapular rash noted of central abdomen, bilateral axilla and bilateral upper thighs. No s/s of infection or cellulitis.  BMET    Component Value Date/Time   NA 141 04/24/2018 0732   K 4.4 04/24/2018 0732   CL 103 04/24/2018 0732   CO2 30 04/24/2018 0732   GLUCOSE 75 04/24/2018 0732   GLUCOSE 108 (H) 05/08/2006 0927   BUN 14 04/24/2018 0732   CREATININE 0.88 04/24/2018 0732   CALCIUM 10.1 04/24/2018 0732   GFRNONAA >90 02/27/2012 1251   GFRAA >90 02/27/2012 1251    Lipid Panel     Component Value Date/Time   CHOL 164 04/24/2018 0732   TRIG 152.0 (H) 04/24/2018 0732   TRIG 63 05/08/2006 0927   HDL 38.50 (L) 04/24/2018 0732   CHOLHDL 4 04/24/2018 0732   VLDL 30.4 04/24/2018 0732   LDLCALC 95 04/24/2018 0732    CBC    Component Value Date/Time   WBC 7.8 04/24/2018 0732   RBC 5.18 04/24/2018 0732   HGB 15.4 04/24/2018 0732   HCT 45.0 04/24/2018 0732   PLT 211.0  04/24/2018 0732     MCV 86.7 04/24/2018 0732   MCH 30.0 02/27/2012 1251   MCHC 34.2 04/24/2018 0732   RDW 13.7 04/24/2018 0732   LYMPHSABS 2.5 04/24/2018 0732   MONOABS 1.0 04/24/2018 0732   EOSABS 0.2 04/24/2018 0732   BASOSABS 0.0 04/24/2018 0732    Hgb A1C Lab Results  Component Value Date   HGBA1C 5.3 04/24/2018           Assessment & Plan:   Eczema:  RX for Clobetasol 0.5% BID prn Avoid hot showers or baths Apply moisturizing lotion such as Avveno or Eucerin BID  Return precautions discussed Webb Silversmith, NP

## 2018-07-12 NOTE — Patient Instructions (Signed)

## 2018-07-29 ENCOUNTER — Ambulatory Visit (INDEPENDENT_AMBULATORY_CARE_PROVIDER_SITE_OTHER): Payer: Medicare HMO | Admitting: Internal Medicine

## 2018-07-29 ENCOUNTER — Encounter: Payer: Self-pay | Admitting: Internal Medicine

## 2018-07-29 ENCOUNTER — Other Ambulatory Visit (INDEPENDENT_AMBULATORY_CARE_PROVIDER_SITE_OTHER): Payer: Medicare HMO

## 2018-07-29 VITALS — BP 160/98 | HR 55 | Temp 97.7°F | Ht 73.0 in | Wt 224.0 lb

## 2018-07-29 DIAGNOSIS — R21 Rash and other nonspecific skin eruption: Secondary | ICD-10-CM

## 2018-07-29 DIAGNOSIS — I10 Essential (primary) hypertension: Secondary | ICD-10-CM

## 2018-07-29 DIAGNOSIS — J301 Allergic rhinitis due to pollen: Secondary | ICD-10-CM

## 2018-07-29 LAB — COMPREHENSIVE METABOLIC PANEL
ALT: 21 U/L (ref 0–53)
AST: 20 U/L (ref 0–37)
Albumin: 4.5 g/dL (ref 3.5–5.2)
Alkaline Phosphatase: 95 U/L (ref 39–117)
BUN: 19 mg/dL (ref 6–23)
CALCIUM: 10 mg/dL (ref 8.4–10.5)
CO2: 30 meq/L (ref 19–32)
Chloride: 102 mEq/L (ref 96–112)
Creatinine, Ser: 0.95 mg/dL (ref 0.40–1.50)
GFR: 94.5 mL/min (ref >60.00)
GLUCOSE: 99 mg/dL (ref 70–99)
Potassium: 4 mEq/L (ref 3.5–5.1)
Sodium: 140 mEq/L (ref 135–145)
Total Bilirubin: 0.6 mg/dL (ref 0.2–1.2)
Total Protein: 7.5 g/dL (ref 6.0–8.3)

## 2018-07-29 LAB — CBC
HCT: 43.6 % (ref 39.0–52.0)
Hemoglobin: 14.9 g/dL (ref 13.0–17.0)
MCHC: 34.3 g/dL (ref 30.0–36.0)
MCV: 86 fl (ref 78.0–100.0)
Platelets: 196 10*3/uL (ref 150.0–400.0)
RBC: 5.07 Mil/uL (ref 4.22–5.81)
RDW: 13.6 % (ref 11.5–15.5)
WBC: 7.4 10*3/uL (ref 4.0–10.5)

## 2018-07-29 LAB — TSH: TSH: 1.9 u[IU]/mL (ref 0.35–4.50)

## 2018-07-29 NOTE — Assessment & Plan Note (Signed)
Given change in appearance of rash checking CBC, CMP, TSH to rule out metabolic cause. Advised to use clobetasol cream on the area and start taking allergy medication xyzal.

## 2018-07-29 NOTE — Progress Notes (Signed)
   Subjective:   Patient ID: [name] [name], male    DOB: 04-13-1946, 73 y.o.   MRN: [id number]  HPI The patient is a 73 YO man coming in for rash. He was given clobetasol cream about 2-3 weeks ago by another provider. He denies change in lotion, soap, detergent, etc. No one else in the household has similar rash. Overall it was improving with the cream. Now is worsening. He has a new rash on the right lower leg which is not itching much but is red, has not used the cream on it, started 1-2 days ago. The rash on stomach and thighs is mostly gone with the clobetasol.  Has not taken blood pressure medicine in 3-4 days. Denies headaches or chest pains or abdominal pain or N/V/D.   Review of Systems  Constitutional: Negative.   HENT: Negative.   Eyes: Negative.   Respiratory: Negative for cough, chest tightness and shortness of breath.   Cardiovascular: Negative for chest pain, palpitations and leg swelling.  Gastrointestinal: Negative for abdominal distention, abdominal pain, constipation, diarrhea, nausea and vomiting.  Musculoskeletal: Negative.   Skin: Positive for rash.  Neurological: Negative.   Psychiatric/Behavioral: Negative.     Objective:  Physical Exam Constitutional:      Appearance: He is well-developed.  HENT:     Head: Normocephalic and atraumatic.  Neck:     Musculoskeletal: Normal range of motion.  Cardiovascular:     Rate and Rhythm: Normal rate and regular rhythm.  Pulmonary:     Effort: Pulmonary effort is normal. No respiratory distress.     Breath sounds: Normal breath sounds. No wheezing or rales.  Abdominal:     General: Bowel sounds are normal. There is no distension.     Palpations: Abdomen is soft.     Tenderness: There is no abdominal tenderness. There is no rebound.  Skin:    General: Skin is warm and dry.     Findings: Rash present.     Comments: Stomach without rash, right lower leg with a red raised rash which does not blanch.  Neurological:   Mental Status: He is alert and oriented to person, place, and time.     Coordination: Coordination normal.     Vitals:   07/29/18 0837  BP: (!) 160/98  Pulse: (!) 55  Temp: 97.7 F (36.5 C)  TempSrc: Oral  SpO2: 97%  Weight: 224 lb (101.6 kg)  Height: 6\' 1"  (1.854 m)    Assessment & Plan:

## 2018-07-29 NOTE — Assessment & Plan Note (Signed)
BP is markedly elevated without urgency today due to not taking medication for 3 days. Advised to immediately resume medication when getting home with his amlodipine 10 mg daily and benicar 40 mg daily and carvedilol 25 mg BID. Checking CMP.

## 2018-07-29 NOTE — Patient Instructions (Signed)
We will check the labs today.   Start taking the xyzal or zyrtec again daily to help with the itching.  Use lotion head to toe once or twice a day to help with dry skin.  You can use the cream twice a day on the rash on the leg.

## 2018-07-29 NOTE — Assessment & Plan Note (Signed)
Advised to start taking xyzal for the itching and can double dosing to BID to help with itch. Checking labs for abnormalities but has had elevated monocytes in the past.

## 2018-09-15 ENCOUNTER — Other Ambulatory Visit: Payer: Self-pay | Admitting: Internal Medicine

## 2018-10-21 ENCOUNTER — Other Ambulatory Visit: Payer: Self-pay | Admitting: Internal Medicine

## 2018-10-21 MED ORDER — SILDENAFIL CITRATE 20 MG PO TABS: ORAL_TABLET | ORAL | 3 refills | Status: DC

## 2018-10-21 NOTE — Telephone Encounter (Signed)
Patient requesting sildenafil to be sent to [facility].  Was originally sent to [facility].  Patient does not want to use [facility] for this script anymore.

## 2018-11-05 ENCOUNTER — Other Ambulatory Visit (INDEPENDENT_AMBULATORY_CARE_PROVIDER_SITE_OTHER): Payer: Medicare HMO

## 2018-11-05 ENCOUNTER — Ambulatory Visit: Payer: Self-pay

## 2018-11-05 ENCOUNTER — Encounter: Payer: Self-pay | Admitting: Internal Medicine

## 2018-11-05 ENCOUNTER — Other Ambulatory Visit: Payer: Self-pay

## 2018-11-05 ENCOUNTER — Ambulatory Visit (INDEPENDENT_AMBULATORY_CARE_PROVIDER_SITE_OTHER): Payer: Medicare HMO | Admitting: Internal Medicine

## 2018-11-05 DIAGNOSIS — R3915 Urgency of urination: Secondary | ICD-10-CM | POA: Diagnosis not present

## 2018-11-05 DIAGNOSIS — E538 Deficiency of other specified B group vitamins: Secondary | ICD-10-CM | POA: Diagnosis not present

## 2018-11-05 DIAGNOSIS — R350 Frequency of micturition: Secondary | ICD-10-CM | POA: Diagnosis not present

## 2018-11-05 DIAGNOSIS — I10 Essential (primary) hypertension: Secondary | ICD-10-CM

## 2018-11-05 DIAGNOSIS — E559 Vitamin D deficiency, unspecified: Secondary | ICD-10-CM | POA: Diagnosis not present

## 2018-11-05 LAB — BASIC METABOLIC PANEL
BUN: 13 mg/dL (ref 6–23)
CO2: 27 mEq/L (ref 19–32)
Calcium: 9.6 mg/dL (ref 8.4–10.5)
Chloride: 103 mEq/L (ref 96–112)
Creatinine, Ser: 0.91 mg/dL (ref 0.40–1.50)
GFR: 99.23 mL/min (ref >60.00)
Glucose, Bld: 101 mg/dL — ABNORMAL HIGH (ref 70–99)
Potassium: 3.5 mEq/L (ref 3.5–5.1)
Sodium: 139 mEq/L (ref 135–145)

## 2018-11-05 LAB — URINALYSIS
Bilirubin Urine: NEGATIVE
Hgb urine dipstick: NEGATIVE
Ketones, ur: NEGATIVE
Leukocytes,Ua: NEGATIVE
Nitrite: NEGATIVE
Specific Gravity, Urine: 1.015 (ref 1.000–1.030)
Total Protein, Urine: NEGATIVE
Urine Glucose: NEGATIVE
Urobilinogen, UA: 0.2 (ref 0.0–1.0)
pH: 5.5 (ref 5.0–8.0)

## 2018-11-05 NOTE — Assessment & Plan Note (Signed)
On B12 

## 2018-11-05 NOTE — Assessment & Plan Note (Signed)
On Vit D 

## 2018-11-05 NOTE — Assessment & Plan Note (Signed)
UA and GC/Chlam HIV test

## 2018-11-05 NOTE — Assessment & Plan Note (Signed)
Coreg, Benicar and Amlodipine 

## 2018-11-05 NOTE — Assessment & Plan Note (Signed)
Labs

## 2018-11-05 NOTE — Addendum Note (Signed)
Addended by: Raliegh Ip on: 11/05/2018 09:51 AM   Modules accepted: Orders

## 2018-11-05 NOTE — Progress Notes (Signed)
Subjective:  Patient ID: [name] [name], male    DOB: 23-Aug-1945  Age: 73 y.o. MRN: [id number]  CC: No chief complaint on file.   HPI [name] [name] presents for HTN, BPH, Vit D def C/o groin odor, frequency  Outpatient Medications Prior to Visit  Medication Sig Dispense Refill  . amLODipine (NORVASC) 10 MG tablet TAKE 1 TABLET EVERY DAY 90 tablet 2  . B Complex Vitamins (VITAMIN B COMPLEX) TABS Take 1 tablet by mouth daily.    . carvedilol (COREG) 25 MG tablet 1TAKE 1 TABLET (25 MG TOTAL) BY MOUTH 2 (TWO) TIMES DAILY WITH A MEAL. 180 tablet 3  . Cholecalciferol (VITAMIN D3) 2000 units capsule Take 1 capsule (2,000 Units total) by mouth daily. 100 capsule 3  . Cholecalciferol (VITAMIN D3) 50000 units CAPS Take 1 capsule by mouth once a week. 6 capsule 0  . clobetasol cream (TEMOVATE) 6.80 % Apply 1 application topically 2 (two) times daily. 30 g 0  . fluticasone (FLONASE) 50 MCG/ACT nasal spray Place 2 sprays into both nostrils daily. 16 g 0  . levocetirizine (XYZAL) 5 MG tablet Take 1 tablet (5 mg total) by mouth every evening. 30 tablet 5  . Multiple Vitamins-Minerals (SENIOR MULTIVITAMIN PLUS) TABS Take 1 tablet by mouth daily.    Marland Kitchen olmesartan (BENICAR) 40 MG tablet TAKE 1 TABLET (40 MG TOTAL) BY MOUTH DAILY. 90 tablet 3  . ranitidine (ZANTAC) 300 MG tablet Take 1 tablet (300 mg total) by mouth at bedtime. 90 tablet 3  . sildenafil (REVATIO) 20 MG tablet 1-5 tablets prn 60 tablet 3   No facility-administered medications prior to visit.     ROS: Review of Systems  Constitutional: Negative for appetite change, fatigue and unexpected weight change.  HENT: Negative for congestion, nosebleeds, sneezing, sore throat and trouble swallowing.   Eyes: Negative for itching and visual disturbance.  Respiratory: Negative for cough.   Cardiovascular: Negative for chest pain, palpitations and leg swelling.  Gastrointestinal: Negative for abdominal distention, blood in stool, diarrhea and  nausea.  Genitourinary: Negative for frequency and hematuria.  Musculoskeletal: Negative for back pain, gait problem, joint swelling and neck pain.  Skin: Negative for rash.  Neurological: Negative for dizziness, tremors, speech difficulty and weakness.  Psychiatric/Behavioral: Negative for agitation, dysphoric mood, sleep disturbance and suicidal ideas. The patient is not nervous/anxious.     Objective:  BP 140/74 (BP Location: Left Arm, Patient Position: Sitting, Cuff Size: Large)   Pulse 62   Temp 98 F (36.7 C) (Oral)   Ht 6\' 1"  (1.854 m)   Wt 226 lb (102.5 kg)   SpO2 97%   BMI 29.82 kg/m   BP Readings from Last 3 Encounters:  11/05/18 140/74  07/29/18 (!) 160/98  07/12/18 (!) 144/82    Wt Readings from Last 3 Encounters:  11/05/18 226 lb (102.5 kg)  07/29/18 224 lb (101.6 kg)  07/12/18 224 lb (101.6 kg)    Physical Exam Constitutional:      General: He is not in acute distress.    Appearance: He is well-developed.     Comments: NAD  Eyes:     Conjunctiva/sclera: Conjunctivae normal.     Pupils: Pupils are equal, round, and reactive to light.  Neck:     Musculoskeletal: Normal range of motion.     Thyroid: No thyromegaly.     Vascular: No JVD.  Cardiovascular:     Rate and Rhythm: Normal rate and regular rhythm.     Heart  sounds: Normal heart sounds. No murmur. No friction rub. No gallop.   Pulmonary:     Effort: Pulmonary effort is normal. No respiratory distress.     Breath sounds: Normal breath sounds. No wheezing or rales.  Chest:     Chest wall: No tenderness.  Abdominal:     General: Bowel sounds are normal. There is no distension.     Palpations: Abdomen is soft. There is no mass.     Tenderness: There is no abdominal tenderness. There is no guarding or rebound.  Musculoskeletal: Normal range of motion.        General: No tenderness.  Lymphadenopathy:     Cervical: No cervical adenopathy.  Skin:    General: Skin is warm and dry.     Findings: No  rash.  Neurological:     Mental Status: He is alert and oriented to person, place, and time.     Cranial Nerves: No cranial nerve deficit.     Motor: No abnormal muscle tone.     Coordination: Coordination normal.     Gait: Gait normal.     Deep Tendon Reflexes: Reflexes are normal and symmetric.  Psychiatric:        Behavior: Behavior normal.        Thought Content: Thought content normal.        Judgment: Judgment normal.     Lab Results  Component Value Date   WBC 7.4 07/29/2018   HGB 14.9 07/29/2018   HCT 43.6 07/29/2018   PLT 196.0 07/29/2018   GLUCOSE 99 07/29/2018   CHOL 164 04/24/2018   TRIG 152.0 (H) 04/24/2018   HDL 38.50 (L) 04/24/2018   LDLDIRECT 134.0 02/08/2012   LDLCALC 95 04/24/2018   ALT 21 07/29/2018   AST 20 07/29/2018   NA 140 07/29/2018   K 4.0 07/29/2018   CL 102 07/29/2018   CREATININE 0.95 07/29/2018   BUN 19 07/29/2018   CO2 30 07/29/2018   TSH 1.90 07/29/2018   PSA 0.95 04/24/2018   HGBA1C 5.3 04/24/2018    Dg Chest 2 View  Result Date: 05/30/2013 CLINICAL DATA:  Cough EXAM: CHEST  2 VIEW COMPARISON:  02/27/2012 FINDINGS: Bandlike lingula density compatible with atelectasis or scarring. Normal heart size. Stable prominent hilar vascularity. No edema pattern or pneumonia. No collapse or consolidation. Negative for effusion or pneumothorax. Trachea midline. IMPRESSION: Lingula atelectasis versus scarring. Otherwise stable exam without acute process Electronically Signed   By: Daryll Brod M.D.   On: 05/30/2013 11:20    Assessment & Plan:   There are no diagnoses linked to this encounter.   No orders of the defined types were placed in this encounter.    Follow-up: No follow-ups on file.  Walker Kehr, MD

## 2018-11-06 LAB — HIV ANTIBODY (ROUTINE TESTING W REFLEX): HIV 1&2 Ab, 4th Generation: NONREACTIVE

## 2018-11-09 LAB — GC/CHLAMYDIA PROBE AMP
Chlamydia trachomatis, NAA: NEGATIVE
Neisseria Gonorrhoeae by PCR: NEGATIVE

## 2019-01-21 ENCOUNTER — Ambulatory Visit (INDEPENDENT_AMBULATORY_CARE_PROVIDER_SITE_OTHER): Payer: Medicare HMO | Admitting: Internal Medicine

## 2019-01-21 ENCOUNTER — Encounter: Payer: Self-pay | Admitting: Internal Medicine

## 2019-01-21 DIAGNOSIS — J301 Allergic rhinitis due to pollen: Secondary | ICD-10-CM | POA: Diagnosis not present

## 2019-01-21 DIAGNOSIS — R519 Headache, unspecified: Secondary | ICD-10-CM

## 2019-01-21 DIAGNOSIS — R51 Headache: Secondary | ICD-10-CM | POA: Diagnosis not present

## 2019-01-21 DIAGNOSIS — J4520 Mild intermittent asthma, uncomplicated: Secondary | ICD-10-CM | POA: Diagnosis not present

## 2019-01-21 MED ORDER — FLUTICASONE PROPIONATE 50 MCG/ACT NA SUSP: 2.0000 | Freq: Every day | NASAL | 5 refills | Status: DC

## 2019-01-21 MED ORDER — BREO ELLIPTA 100-25 MCG/INH IN AEPB: 1.0000 | INHALATION_SPRAY | Freq: Every day | RESPIRATORY_TRACT | 5 refills | Status: DC

## 2019-01-21 NOTE — Progress Notes (Signed)
Virtual Visit via Video Note  I connected with [name] [name] on 01/21/19 at  9:30 AM EDT by a video enabled telemedicine application and verified that I am speaking with the correct person using two identifiers.   I discussed the limitations of evaluation and management by telemedicine and the availability of in person appointments. The patient expressed understanding and agreed to proceed.  History of Present Illness: We need to follow-up on cough x 1 mo. No fever, no SOB. Dry cogh  There has been no chest pain, shortness of breath, abdominal pain, diarrhea, constipation, arthralgias, skin rashes.   Observations/Objective: The patient appears to be in no acute distress, looks well.  Assessment and Plan:  See my Assessment and Plan. Follow Up Instructions:    I discussed the assessment and treatment plan with the patient. The patient was provided an opportunity to ask questions and all were answered. The patient agreed with the plan and demonstrated an understanding of the instructions.   The patient was advised to call back or seek an in-person evaluation if the symptoms worsen or if the condition fails to improve as anticipated.  I provided face-to-face time during this encounter. We were at different locations.   Walker Kehr, MD

## 2019-01-21 NOTE — Assessment & Plan Note (Signed)
Breo Claritin Flonase OTC meds CXR if not better

## 2019-01-21 NOTE — Assessment & Plan Note (Signed)
Claritin  Flonase

## 2019-03-02 ENCOUNTER — Other Ambulatory Visit: Payer: Self-pay | Admitting: Internal Medicine

## 2019-03-02 MED ORDER — CARVEDILOL 25 MG PO TABS: ORAL_TABLET | ORAL | 3 refills | Status: DC

## 2019-03-02 NOTE — Telephone Encounter (Signed)
Copied from [place] [id number]. Topic: Quick Communication - Rx Refill/Question >> Mar 02, 2019  9:02 AM Leward Quan A wrote: Medication: carvedilol (COREG) 25 MG tablet   90 day supply requested  Has the patient contacted their pharmacy? Yes.   (Agent: If no, request that the patient contact the pharmacy for the refill.) (Agent: If yes, when and what did the pharmacy advise?)  Preferred Pharmacy (with phone number or street name): [facility], [place] [phone] (Phone) [phone] (Fax)    Agent: Please be advised that RX refills may take up to 3 business days. We ask that you follow-up with your pharmacy.

## 2019-03-17 ENCOUNTER — Other Ambulatory Visit: Payer: Self-pay | Admitting: Internal Medicine

## 2019-03-21 DIAGNOSIS — H2513 Age-related nuclear cataract, bilateral: Secondary | ICD-10-CM | POA: Diagnosis not present

## 2019-05-07 ENCOUNTER — Other Ambulatory Visit (INDEPENDENT_AMBULATORY_CARE_PROVIDER_SITE_OTHER): Payer: Medicare HMO

## 2019-05-07 ENCOUNTER — Other Ambulatory Visit: Payer: Self-pay

## 2019-05-07 ENCOUNTER — Encounter: Payer: Self-pay | Admitting: Internal Medicine

## 2019-05-07 ENCOUNTER — Ambulatory Visit (INDEPENDENT_AMBULATORY_CARE_PROVIDER_SITE_OTHER): Payer: Medicare HMO | Admitting: Internal Medicine

## 2019-05-07 VITALS — BP 142/86 | HR 64 | Temp 97.8°F | Ht 73.0 in | Wt 230.0 lb

## 2019-05-07 DIAGNOSIS — E785 Hyperlipidemia, unspecified: Secondary | ICD-10-CM | POA: Diagnosis not present

## 2019-05-07 DIAGNOSIS — J4520 Mild intermittent asthma, uncomplicated: Secondary | ICD-10-CM | POA: Diagnosis not present

## 2019-05-07 DIAGNOSIS — R972 Elevated prostate specific antigen [PSA]: Secondary | ICD-10-CM

## 2019-05-07 DIAGNOSIS — I10 Essential (primary) hypertension: Secondary | ICD-10-CM

## 2019-05-07 DIAGNOSIS — E538 Deficiency of other specified B group vitamins: Secondary | ICD-10-CM

## 2019-05-07 DIAGNOSIS — J301 Allergic rhinitis due to pollen: Secondary | ICD-10-CM | POA: Diagnosis not present

## 2019-05-07 DIAGNOSIS — Z23 Encounter for immunization: Secondary | ICD-10-CM | POA: Diagnosis not present

## 2019-05-07 LAB — CBC WITH DIFFERENTIAL/PLATELET
Basophils Absolute: 0.1 10*3/uL (ref 0.0–0.1)
Basophils Relative: 1 % (ref 0.0–3.0)
Eosinophils Absolute: 0.2 10*3/uL (ref 0.0–0.7)
Eosinophils Relative: 2.5 % (ref 0.0–5.0)
HCT: 44.2 % (ref 39.0–52.0)
Hemoglobin: 15.2 g/dL (ref 13.0–17.0)
Lymphocytes Relative: 32 % (ref 12.0–46.0)
Lymphs Abs: 2.8 10*3/uL (ref 0.7–4.0)
MCHC: 34.3 g/dL (ref 30.0–36.0)
MCV: 86.4 fl (ref 78.0–100.0)
Monocytes Absolute: 1 10*3/uL (ref 0.1–1.0)
Monocytes Relative: 11.1 % (ref 3.0–12.0)
Neutro Abs: 4.6 10*3/uL (ref 1.4–7.7)
Neutrophils Relative %: 53.4 % (ref 43.0–77.0)
Platelets: 192 10*3/uL (ref 150.0–400.0)
RBC: 5.12 Mil/uL (ref 4.22–5.81)
RDW: 13.8 % (ref 11.5–15.5)
WBC: 8.6 10*3/uL (ref 4.0–10.5)

## 2019-05-07 LAB — URINALYSIS
Bilirubin Urine: NEGATIVE
Hgb urine dipstick: NEGATIVE
Ketones, ur: NEGATIVE
Leukocytes,Ua: NEGATIVE
Nitrite: NEGATIVE
Specific Gravity, Urine: 1.02 (ref 1.000–1.030)
Total Protein, Urine: NEGATIVE
Urine Glucose: NEGATIVE
Urobilinogen, UA: 0.2 (ref 0.0–1.0)
pH: 6 (ref 5.0–8.0)

## 2019-05-07 LAB — LIPID PANEL
Cholesterol: 175 mg/dL (ref 0–200)
HDL: 36.9 mg/dL — ABNORMAL LOW (ref >39.00)
LDL Cholesterol: 100 mg/dL — ABNORMAL HIGH (ref 0–99)
NonHDL: 138.04
Total CHOL/HDL Ratio: 5
Triglycerides: 191 mg/dL — ABNORMAL HIGH (ref 0.0–149.0)
VLDL: 38.2 mg/dL (ref 0.0–40.0)

## 2019-05-07 LAB — HEPATIC FUNCTION PANEL
ALT: 21 U/L (ref 0–53)
AST: 19 U/L (ref 0–37)
Albumin: 4.4 g/dL (ref 3.5–5.2)
Alkaline Phosphatase: 107 U/L (ref 39–117)
Bilirubin, Direct: 0.1 mg/dL (ref 0.0–0.3)
Total Bilirubin: 0.5 mg/dL (ref 0.2–1.2)
Total Protein: 7.3 g/dL (ref 6.0–8.3)

## 2019-05-07 LAB — PSA: PSA: 1.05 ng/mL (ref 0.10–4.00)

## 2019-05-07 LAB — BASIC METABOLIC PANEL
BUN: 10 mg/dL (ref 6–23)
CO2: 27 mEq/L (ref 19–32)
Calcium: 9.7 mg/dL (ref 8.4–10.5)
Chloride: 103 mEq/L (ref 96–112)
Creatinine, Ser: 0.83 mg/dL (ref 0.40–1.50)
GFR: 110.19 mL/min (ref >60.00)
Glucose, Bld: 98 mg/dL (ref 70–99)
Potassium: 3.9 mEq/L (ref 3.5–5.1)
Sodium: 138 mEq/L (ref 135–145)

## 2019-05-07 LAB — TSH: TSH: 2.57 u[IU]/mL (ref 0.35–4.50)

## 2019-05-07 MED ORDER — AMLODIPINE-OLMESARTAN 10-40 MG PO TABS: 1.0000 | ORAL_TABLET | Freq: Every day | ORAL | 3 refills | Status: DC

## 2019-05-07 MED ORDER — AMLODIPINE-OLMESARTAN 5-40 MG PO TABS: 1.0000 | ORAL_TABLET | Freq: Every day | ORAL | 3 refills | Status: DC

## 2019-05-07 MED ORDER — CARVEDILOL 25 MG PO TABS: ORAL_TABLET | ORAL | 3 refills | Status: DC

## 2019-05-07 NOTE — Addendum Note (Signed)
Addended by: Karren Cobble on: 05/07/2019 10:03 AM   Modules accepted: Orders

## 2019-05-07 NOTE — Progress Notes (Signed)
Subjective:  Patient ID: [name] [name], male    DOB: 02/08/1946  Age: 73 y.o. MRN: [id number]  CC: No chief complaint on file.   HPI [name] [name] presents for HTN, allergies, asthma f/u  BP was high one day Not taking Coreg  Outpatient Medications Prior to Visit  Medication Sig Dispense Refill  . amLODipine (NORVASC) 10 MG tablet TAKE 1 TABLET EVERY DAY 90 tablet 2  . B Complex Vitamins (VITAMIN B COMPLEX) TABS Take 1 tablet by mouth daily.    . carvedilol (COREG) 25 MG tablet 1TAKE 1 TABLET (25 MG TOTAL) BY MOUTH 2 (TWO) TIMES DAILY WITH A MEAL. 180 tablet 3  . Cholecalciferol (VITAMIN D3) 2000 units capsule Take 1 capsule (2,000 Units total) by mouth daily. 100 capsule 3  . clobetasol cream (TEMOVATE) [id number] % Apply 1 application topically 2 (two) times daily. 30 g 0  . fluticasone (FLONASE) 50 MCG/ACT nasal spray Place 2 sprays into both nostrils daily. 16 g 5  . fluticasone furoate-vilanterol (BREO ELLIPTA) 100-25 MCG/INH AEPB Inhale 1 puff into the lungs daily. 1 each 5  . levocetirizine (XYZAL) 5 MG tablet TAKE 1 TABLET BY MOUTH EVERY DAY IN THE EVENING 90 tablet 1  . Multiple Vitamins-Minerals (SENIOR MULTIVITAMIN PLUS) TABS Take 1 tablet by mouth daily.    Marland Kitchen olmesartan (BENICAR) 40 MG tablet TAKE 1 TABLET (40 MG TOTAL) BY MOUTH DAILY. 90 tablet 3  . sildenafil (REVATIO) 20 MG tablet 1-5 tablets prn 60 tablet 3   No facility-administered medications prior to visit.    ROS: Review of Systems  Constitutional: Negative for appetite change, fatigue and unexpected weight change.  HENT: Negative for congestion, nosebleeds, sneezing, sore throat and trouble swallowing.   Eyes: Negative for itching and visual disturbance.  Respiratory: Negative for cough.   Cardiovascular: Negative for chest pain, palpitations and leg swelling.  Gastrointestinal: Negative for abdominal distention, blood in stool, diarrhea and nausea.  Genitourinary: Negative for frequency and hematuria.    Musculoskeletal: Negative for back pain, gait problem, joint swelling and neck pain.  Skin: Negative for rash.  Neurological: Negative for dizziness, tremors, speech difficulty and weakness.  Psychiatric/Behavioral: Negative for agitation, dysphoric mood, sleep disturbance and suicidal ideas. The patient is not nervous/anxious.     Objective:  BP (!) 142/86 (BP Location: Left Arm, Patient Position: Sitting, Cuff Size: Large)   Pulse 64   Temp 97.8 F (36.6 C) (Oral)   Ht 6\' 1"  (1.854 m)   Wt 230 lb (104.3 kg)   SpO2 98%   BMI 30.34 kg/m   BP Readings from Last 3 Encounters:  05/07/19 (!) 142/86  11/05/18 140/74  07/29/18 (!) 160/98    Wt Readings from Last 3 Encounters:  05/07/19 230 lb (104.3 kg)  11/05/18 226 lb (102.5 kg)  07/29/18 224 lb (101.6 kg)    Physical Exam Constitutional:      General: He is not in acute distress.    Appearance: He is well-developed.     Comments: NAD  Eyes:     Conjunctiva/sclera: Conjunctivae normal.     Pupils: Pupils are equal, round, and reactive to light.  Neck:     Thyroid: No thyromegaly.     Vascular: No JVD.  Cardiovascular:     Rate and Rhythm: Normal rate and regular rhythm.     Heart sounds: Normal heart sounds. No murmur. No friction rub. No gallop.   Pulmonary:     Effort: Pulmonary effort is normal. No  respiratory distress.     Breath sounds: Normal breath sounds. No wheezing or rales.  Chest:     Chest wall: No tenderness.  Abdominal:     General: Bowel sounds are normal. There is no distension.     Palpations: Abdomen is soft. There is no mass.     Tenderness: There is no abdominal tenderness. There is no guarding or rebound.  Musculoskeletal:        General: No tenderness. Normal range of motion.     Cervical back: Normal range of motion.  Lymphadenopathy:     Cervical: No cervical adenopathy.  Skin:    General: Skin is warm and dry.     Findings: No rash.  Neurological:     Mental Status: He is alert and  oriented to person, place, and time.     Cranial Nerves: No cranial nerve deficit.     Motor: No abnormal muscle tone.     Coordination: Coordination normal.     Gait: Gait normal.     Deep Tendon Reflexes: Reflexes are normal and symmetric.  Psychiatric:        Behavior: Behavior normal.        Thought Content: Thought content normal.        Judgment: Judgment normal.     Lab Results  Component Value Date   WBC 7.4 07/29/2018   HGB 14.9 07/29/2018   HCT 43.6 07/29/2018   PLT 196.0 07/29/2018   GLUCOSE 101 (H) 11/05/2018   CHOL 164 04/24/2018   TRIG 152.0 (H) 04/24/2018   HDL 38.50 (L) 04/24/2018   LDLDIRECT 134.0 02/08/2012   LDLCALC 95 04/24/2018   ALT 21 07/29/2018   AST 20 07/29/2018   NA 139 11/05/2018   K 3.5 11/05/2018   CL 103 11/05/2018   CREATININE 0.91 11/05/2018   BUN 13 11/05/2018   CO2 27 11/05/2018   TSH 1.90 07/29/2018   PSA 0.95 04/24/2018   HGBA1C 5.3 04/24/2018    DG Chest 2 View  Result Date: 05/30/2013 CLINICAL DATA:  Cough EXAM: CHEST  2 VIEW COMPARISON:  02/27/2012 FINDINGS: Bandlike lingula density compatible with atelectasis or scarring. Normal heart size. Stable prominent hilar vascularity. No edema pattern or pneumonia. No collapse or consolidation. Negative for effusion or pneumothorax. Trachea midline. IMPRESSION: Lingula atelectasis versus scarring. Otherwise stable exam without acute process Electronically Signed   By: Daryll Brod M.D.   On: 05/30/2013 11:20    Assessment & Plan:   There are no diagnoses linked to this encounter.   No orders of the defined types were placed in this encounter.    Follow-up: No follow-ups on file.  Walker Kehr, MD

## 2019-05-07 NOTE — Assessment & Plan Note (Signed)
On B12 

## 2019-05-07 NOTE — Assessment & Plan Note (Addendum)
Xyzal Flonase

## 2019-05-07 NOTE — Assessment & Plan Note (Signed)
PSA

## 2019-05-07 NOTE — Assessment & Plan Note (Signed)
Declined statins. 

## 2019-05-07 NOTE — Assessment & Plan Note (Signed)
BP Readings from Last 3 Encounters:  05/07/19 (!) 142/86  11/05/18 140/74  07/29/18 (!) 160/98

## 2019-05-07 NOTE — Assessment & Plan Note (Signed)
Claritin  Flonase

## 2019-07-05 ENCOUNTER — Ambulatory Visit: Payer: Medicare HMO | Attending: Internal Medicine

## 2019-07-05 DIAGNOSIS — Z23 Encounter for immunization: Secondary | ICD-10-CM | POA: Insufficient documentation

## 2019-07-05 NOTE — Progress Notes (Signed)
   Covid-19 Vaccination Clinic  Name:  [name] [name]    MRN: [id number] DOB: [birth year]  07/05/2019  Mr. Mcconnel was observed post Covid-19 immunization for 15 minutes without incidence. He was provided with Vaccine Information Sheet and instruction to access the V-Safe system.   Mr. Postiglione was instructed to call 911 with any severe reactions post vaccine: Marland Kitchen Difficulty breathing  . Swelling of your face and throat  . A fast heartbeat  . A bad rash all over your body  . Dizziness and weakness    Immunizations Administered    Name Date Dose VIS Date Route   Pfizer COVID-19 Vaccine 07/05/2019 11:40 AM 0.3 mL 05/01/2019 Intramuscular   Manufacturer: [place]   Lot: [id number]   [place]: [id number]

## 2019-07-17 ENCOUNTER — Telehealth: Payer: Self-pay | Admitting: Internal Medicine

## 2019-07-17 DIAGNOSIS — R519 Headache, unspecified: Secondary | ICD-10-CM

## 2019-07-17 MED ORDER — FLUTICASONE PROPIONATE 50 MCG/ACT NA SUSP: 2.0000 | Freq: Every day | NASAL | 3 refills | Status: DC

## 2019-07-17 NOTE — Telephone Encounter (Signed)
RX sent

## 2019-07-17 NOTE — Telephone Encounter (Signed)
New message:   Pt is calling and states he needs a prescription for fluticasone (FLONASE) 50 MCG/ACT nasal spray sent to [facility]. He states they sent a request on 07/09/19 and there has been no response. Please advise.

## 2019-07-28 ENCOUNTER — Ambulatory Visit: Payer: Medicare HMO | Attending: Internal Medicine

## 2019-07-28 DIAGNOSIS — Z23 Encounter for immunization: Secondary | ICD-10-CM | POA: Insufficient documentation

## 2019-07-28 NOTE — Progress Notes (Signed)
   Covid-19 Vaccination Clinic  Name:  [name] [name]    MRN: [id number] DOB: 10-Oct-1945  07/28/2019  [name] was observed post Covid-19 immunization for 15 minutes without incident. He was provided with Vaccine Information Sheet and instruction to access the V-Safe system.   [name] was instructed to call 911 with any severe reactions post vaccine: Marland Kitchen Difficulty breathing  . Swelling of face and throat  . A fast heartbeat  . A bad rash all over body  . Dizziness and weakness   Immunizations Administered    Name Date Dose VIS Date Route   Pfizer COVID-19 Vaccine 07/28/2019  3:07 PM 0.3 mL 05/01/2019 Intramuscular   Manufacturer: [place]   Lot: [id number]   [place]: [id number]

## 2019-08-26 ENCOUNTER — Other Ambulatory Visit: Payer: Self-pay

## 2019-08-26 ENCOUNTER — Ambulatory Visit: Payer: Medicare HMO | Admitting: Internal Medicine

## 2019-08-26 ENCOUNTER — Encounter: Payer: Self-pay | Admitting: Internal Medicine

## 2019-08-26 DIAGNOSIS — E538 Deficiency of other specified B group vitamins: Secondary | ICD-10-CM | POA: Diagnosis not present

## 2019-08-26 DIAGNOSIS — M25561 Pain in right knee: Secondary | ICD-10-CM | POA: Diagnosis not present

## 2019-08-26 DIAGNOSIS — E559 Vitamin D deficiency, unspecified: Secondary | ICD-10-CM

## 2019-08-26 DIAGNOSIS — G8929 Other chronic pain: Secondary | ICD-10-CM

## 2019-08-26 DIAGNOSIS — I10 Essential (primary) hypertension: Secondary | ICD-10-CM

## 2019-08-26 MED ORDER — METHYLPREDNISOLONE 4 MG PO TBPK: ORAL_TABLET | ORAL | 0 refills | Status: DC

## 2019-08-26 MED ORDER — KETOROLAC TROMETHAMINE 60 MG/2ML IM SOLN
60.0000 mg | Freq: Once | INTRAMUSCULAR | Status: AC
  Administered 2019-08-26: 60 mg via INTRAMUSCULAR

## 2019-08-26 MED ORDER — DICLOFENAC SODIUM 1 % EX GEL: 4.0000 g | Freq: Four times a day (QID) | CUTANEOUS | 3 refills | Status: DC

## 2019-08-26 MED ORDER — MELOXICAM 15 MG PO TABS: 15.0000 mg | ORAL_TABLET | Freq: Every day | ORAL | 3 refills | Status: DC | PRN

## 2019-08-26 NOTE — Progress Notes (Signed)
Subjective:  Patient ID: [name] [name], male    DOB: 03/21/1946  Age: 74 y.o. MRN: [id number]  CC: No chief complaint on file.   HPI [name] [name] presents for allergies, HTN, B12 def f/u C/o R knee pain - worse  Outpatient Medications Prior to Visit  Medication Sig Dispense Refill  . amLODipine-olmesartan (AZOR) 10-40 MG tablet Take 1 tablet by mouth daily. 90 tablet 3  . B Complex Vitamins (VITAMIN B COMPLEX) TABS Take 1 tablet by mouth daily.    . carvedilol (COREG) 25 MG tablet 1TAKE 1 TABLET (25 MG TOTAL) BY MOUTH 2 (TWO) TIMES DAILY WITH A MEAL. 180 tablet 3  . Cholecalciferol (VITAMIN D3) 2000 units capsule Take 1 capsule (2,000 Units total) by mouth daily. 100 capsule 3  . clobetasol cream (TEMOVATE) [id number] % Apply 1 application topically 2 (two) times daily. 30 g 0  . fluticasone (FLONASE) 50 MCG/ACT nasal spray Place 2 sprays into both nostrils daily. 16 g 3  . fluticasone furoate-vilanterol (BREO ELLIPTA) 100-25 MCG/INH AEPB Inhale 1 puff into the lungs daily. 1 each 5  . levocetirizine (XYZAL) 5 MG tablet TAKE 1 TABLET BY MOUTH EVERY DAY IN THE EVENING 90 tablet 1  . Multiple Vitamins-Minerals (SENIOR MULTIVITAMIN PLUS) TABS Take 1 tablet by mouth daily.    . sildenafil (REVATIO) 20 MG tablet 1-5 tablets prn 60 tablet 3   No facility-administered medications prior to visit.    ROS: Review of Systems  Constitutional: Negative for appetite change, fatigue and unexpected weight change.  HENT: Negative for congestion, nosebleeds, sneezing, sore throat and trouble swallowing.   Eyes: Negative for itching and visual disturbance.  Respiratory: Negative for cough.   Cardiovascular: Negative for chest pain, palpitations and leg swelling.  Gastrointestinal: Negative for abdominal distention, blood in stool, diarrhea and nausea.  Genitourinary: Negative for frequency and hematuria.  Musculoskeletal: Positive for arthralgias and gait problem. Negative for back pain, joint swelling  and neck pain.  Skin: Negative for rash.  Neurological: Negative for dizziness, tremors, speech difficulty and weakness.  Psychiatric/Behavioral: Negative for agitation, dysphoric mood and sleep disturbance. The patient is not nervous/anxious.     Objective:  Ht 6\' 1"  (1.854 m)   BMI 30.34 kg/m   BP Readings from Last 3 Encounters:  05/07/19 (!) 142/86  11/05/18 140/74  07/29/18 (!) 160/98    Wt Readings from Last 3 Encounters:  05/07/19 230 lb (104.3 kg)  11/05/18 226 lb (102.5 kg)  07/29/18 224 lb (101.6 kg)    Physical Exam Constitutional:      General: He is not in acute distress.    Appearance: He is well-developed.     Comments: NAD  Eyes:     Conjunctiva/sclera: Conjunctivae normal.     Pupils: Pupils are equal, round, and reactive to light.  Neck:     Thyroid: No thyromegaly.     Vascular: No JVD.  Cardiovascular:     Rate and Rhythm: Normal rate and regular rhythm.     Heart sounds: Normal heart sounds. No murmur. No friction rub. No gallop.   Pulmonary:     Effort: Pulmonary effort is normal. No respiratory distress.     Breath sounds: Normal breath sounds. No wheezing or rales.  Chest:     Chest wall: No tenderness.  Abdominal:     General: Bowel sounds are normal. There is no distension.     Palpations: Abdomen is soft. There is no mass.     Tenderness:  There is no abdominal tenderness. There is no guarding or rebound.  Musculoskeletal:        General: No tenderness. Normal range of motion.     Cervical back: Normal range of motion.  Lymphadenopathy:     Cervical: No cervical adenopathy.  Skin:    General: Skin is warm and dry.     Findings: No rash.  Neurological:     Mental Status: He is alert and oriented to person, place, and time.     Cranial Nerves: No cranial nerve deficit.     Motor: No abnormal muscle tone.     Coordination: Coordination normal.     Gait: Gait normal.     Deep Tendon Reflexes: Reflexes are normal and symmetric.    Psychiatric:        Behavior: Behavior normal.        Thought Content: Thought content normal.        Judgment: Judgment normal.     Lab Results  Component Value Date   WBC 8.6 05/07/2019   HGB 15.2 05/07/2019   HCT 44.2 05/07/2019   PLT 192.0 05/07/2019   GLUCOSE 98 05/07/2019   CHOL 175 05/07/2019   TRIG 191.0 (H) 05/07/2019   HDL 36.90 (L) 05/07/2019   LDLDIRECT 134.0 02/08/2012   LDLCALC 100 (H) 05/07/2019   ALT 21 05/07/2019   AST 19 05/07/2019   NA 138 05/07/2019   K 3.9 05/07/2019   CL 103 05/07/2019   CREATININE 0.83 05/07/2019   BUN 10 05/07/2019   CO2 27 05/07/2019   TSH 2.57 05/07/2019   PSA 1.05 05/07/2019   HGBA1C 5.3 04/24/2018    DG Chest 2 View  Result Date: 05/30/2013 CLINICAL DATA:  Cough EXAM: CHEST  2 VIEW COMPARISON:  02/27/2012 FINDINGS: Bandlike lingula density compatible with atelectasis or scarring. Normal heart size. Stable prominent hilar vascularity. No edema pattern or pneumonia. No collapse or consolidation. Negative for effusion or pneumothorax. Trachea midline. IMPRESSION: Lingula atelectasis versus scarring. Otherwise stable exam without acute process Electronically Signed   By: Daryll Brod M.D.   On: 05/30/2013 11:20    Assessment & Plan:   There are no diagnoses linked to this encounter.   No orders of the defined types were placed in this encounter.    Follow-up: No follow-ups on file.  Walker Kehr, MD

## 2019-08-26 NOTE — Patient Instructions (Signed)
Ice your knee

## 2019-08-26 NOTE — Addendum Note (Signed)
Addended by: Cresenciano Lick on: 08/26/2019 11:24 AM   Modules accepted: Orders

## 2019-08-26 NOTE — Assessment & Plan Note (Signed)
Coreg, Benicar and Amlodipine 

## 2019-08-26 NOTE — Assessment & Plan Note (Signed)
Vit D 

## 2019-08-26 NOTE — Assessment & Plan Note (Signed)
On B12 

## 2019-08-26 NOTE — Assessment & Plan Note (Addendum)
Toradol 60 mg IM Medrol pack Voltaren gel or Meloxicam po Ice Brace Ortho ref if not better Work less

## 2019-11-12 ENCOUNTER — Ambulatory Visit: Payer: Medicare HMO | Admitting: Internal Medicine

## 2019-11-18 ENCOUNTER — Other Ambulatory Visit: Payer: Self-pay

## 2019-11-18 ENCOUNTER — Ambulatory Visit (INDEPENDENT_AMBULATORY_CARE_PROVIDER_SITE_OTHER): Payer: Medicare HMO | Admitting: Internal Medicine

## 2019-11-18 ENCOUNTER — Encounter: Payer: Self-pay | Admitting: Internal Medicine

## 2019-11-18 DIAGNOSIS — N320 Bladder-neck obstruction: Secondary | ICD-10-CM | POA: Diagnosis not present

## 2019-11-18 DIAGNOSIS — E538 Deficiency of other specified B group vitamins: Secondary | ICD-10-CM | POA: Diagnosis not present

## 2019-11-18 DIAGNOSIS — G8929 Other chronic pain: Secondary | ICD-10-CM | POA: Diagnosis not present

## 2019-11-18 DIAGNOSIS — M25561 Pain in right knee: Secondary | ICD-10-CM

## 2019-11-18 MED ORDER — TADALAFIL 5 MG PO TABS: 5.0000 mg | ORAL_TABLET | Freq: Every day | ORAL | 11 refills | Status: DC

## 2019-11-18 NOTE — Assessment & Plan Note (Signed)
Worse Ortho ref 

## 2019-11-18 NOTE — Assessment & Plan Note (Signed)
Worse Cialis 5 mg/d trial

## 2019-11-18 NOTE — Progress Notes (Signed)
Subjective:  Patient ID: [name] [name], male    DOB: [birth year]  Age: 74 y.o. MRN: [id number]  CC: No chief complaint on file.   HPI [name] [name] presents for HTN, B12 def C/o frequent urination - worse C/o R knee - worse  Outpatient Medications Prior to Visit  Medication Sig Dispense Refill  . amLODipine-olmesartan (AZOR) 10-40 MG tablet Take 1 tablet by mouth daily. 90 tablet 3  . B Complex Vitamins (VITAMIN B COMPLEX) TABS Take 1 tablet by mouth daily.    . carvedilol (COREG) 25 MG tablet 1TAKE 1 TABLET (25 MG TOTAL) BY MOUTH 2 (TWO) TIMES DAILY WITH A MEAL. 180 tablet 3  . Cholecalciferol (VITAMIN D3) 2000 units capsule Take 1 capsule (2,000 Units total) by mouth daily. 100 capsule 3  . clobetasol cream (TEMOVATE) 5.00 % Apply 1 application topically 2 (two) times daily. 30 g 0  . diclofenac Sodium (VOLTAREN) 1 % GEL Apply 4 g topically 4 (four) times daily. R knee 100 g 3  . fluticasone (FLONASE) 50 MCG/ACT nasal spray Place 2 sprays into both nostrils daily. 16 g 3  . fluticasone furoate-vilanterol (BREO ELLIPTA) 100-25 MCG/INH AEPB Inhale 1 puff into the lungs daily. 1 each 5  . levocetirizine (XYZAL) 5 MG tablet TAKE 1 TABLET BY MOUTH EVERY DAY IN THE EVENING 90 tablet 1  . meloxicam (MOBIC) 15 MG tablet Take 1 tablet (15 mg total) by mouth daily as needed for pain. 30 tablet 3  . Multiple Vitamins-Minerals (SENIOR MULTIVITAMIN PLUS) TABS Take 1 tablet by mouth daily.    . sildenafil (REVATIO) 20 MG tablet 1-5 tablets prn 60 tablet 3  . methylPREDNISolone (MEDROL DOSEPAK) 4 MG TBPK tablet As directed (Patient not taking: Reported on 11/18/2019) 21 tablet 0   No facility-administered medications prior to visit.    ROS: Review of Systems  Constitutional: Negative for appetite change, fatigue and unexpected weight change.  HENT: Negative for congestion, nosebleeds, sneezing, sore throat and trouble swallowing.   Eyes: Negative for itching and visual disturbance.   Respiratory: Negative for cough.   Cardiovascular: Negative for chest pain, palpitations and leg swelling.  Gastrointestinal: Negative for abdominal distention, blood in stool, diarrhea and nausea.  Genitourinary: Positive for frequency and urgency. Negative for hematuria.  Musculoskeletal: Positive for arthralgias and gait problem. Negative for back pain, joint swelling and neck pain.  Skin: Negative for rash.  Neurological: Negative for dizziness, tremors, speech difficulty and weakness.  Psychiatric/Behavioral: Negative for agitation, dysphoric mood and sleep disturbance. The patient is not nervous/anxious.     Objective:  BP 122/74 (BP Location: Left Arm, Patient Position: Sitting, Cuff Size: Large)   Pulse (!) 51   Temp 98.4 F (36.9 C) (Oral)   Ht 6\' 1"  (1.854 m)   Wt 226 lb (102.5 kg)   SpO2 95%   BMI 29.82 kg/m   BP Readings from Last 3 Encounters:  11/18/19 122/74  08/26/19 140/90  05/07/19 (!) 142/86    Wt Readings from Last 3 Encounters:  11/18/19 226 lb (102.5 kg)  08/26/19 226 lb (102.5 kg)  05/07/19 230 lb (104.3 kg)    Physical Exam Constitutional:      General: He is not in acute distress.    Appearance: He is well-developed.     Comments: NAD  Eyes:     Conjunctiva/sclera: Conjunctivae normal.     Pupils: Pupils are equal, round, and reactive to light.  Neck:     Thyroid: No thyromegaly.  Vascular: No JVD.  Cardiovascular:     Rate and Rhythm: Normal rate and regular rhythm.     Heart sounds: Normal heart sounds. No murmur heard.  No friction rub. No gallop.   Pulmonary:     Effort: Pulmonary effort is normal. No respiratory distress.     Breath sounds: Normal breath sounds. No wheezing or rales.  Chest:     Chest wall: No tenderness.  Abdominal:     General: Bowel sounds are normal. There is no distension.     Palpations: Abdomen is soft. There is no mass.     Tenderness: There is no abdominal tenderness. There is no guarding or rebound.   Musculoskeletal:        General: No tenderness. Normal range of motion.     Cervical back: Normal range of motion.  Lymphadenopathy:     Cervical: No cervical adenopathy.  Skin:    General: Skin is warm and dry.     Findings: No rash.  Neurological:     Mental Status: He is alert and oriented to person, place, and time.     Cranial Nerves: No cranial nerve deficit.     Motor: No abnormal muscle tone.     Coordination: Coordination normal.     Gait: Gait abnormal.     Deep Tendon Reflexes: Reflexes are normal and symmetric.  Psychiatric:        Behavior: Behavior normal.        Thought Content: Thought content normal.        Judgment: Judgment normal.   R knee is tender  Lab Results  Component Value Date   WBC 8.6 05/07/2019   HGB 15.2 05/07/2019   HCT 44.2 05/07/2019   PLT 192.0 05/07/2019   GLUCOSE 98 05/07/2019   CHOL 175 05/07/2019   TRIG 191.0 (H) 05/07/2019   HDL 36.90 (L) 05/07/2019   LDLDIRECT 134.0 02/08/2012   LDLCALC 100 (H) 05/07/2019   ALT 21 05/07/2019   AST 19 05/07/2019   NA 138 05/07/2019   K 3.9 05/07/2019   CL 103 05/07/2019   CREATININE 0.83 05/07/2019   BUN 10 05/07/2019   CO2 27 05/07/2019   TSH 2.57 05/07/2019   PSA 1.05 05/07/2019   HGBA1C 5.3 04/24/2018    DG Chest 2 View  Result Date: 05/30/2013 CLINICAL DATA:  Cough EXAM: CHEST  2 VIEW COMPARISON:  02/27/2012 FINDINGS: Bandlike lingula density compatible with atelectasis or scarring. Normal heart size. Stable prominent hilar vascularity. No edema pattern or pneumonia. No collapse or consolidation. Negative for effusion or pneumothorax. Trachea midline. IMPRESSION: Lingula atelectasis versus scarring. Otherwise stable exam without acute process Electronically Signed   By: Daryll Brod M.D.   On: 05/30/2013 11:20    Assessment & Plan:   Walker Kehr, MD

## 2019-11-18 NOTE — Assessment & Plan Note (Signed)
On B12 

## 2019-11-30 DIAGNOSIS — M1711 Unilateral primary osteoarthritis, right knee: Secondary | ICD-10-CM | POA: Diagnosis not present

## 2019-11-30 DIAGNOSIS — M1712 Unilateral primary osteoarthritis, left knee: Secondary | ICD-10-CM | POA: Diagnosis not present

## 2019-11-30 DIAGNOSIS — M170 Bilateral primary osteoarthritis of knee: Secondary | ICD-10-CM | POA: Diagnosis not present

## 2019-12-05 ENCOUNTER — Other Ambulatory Visit: Payer: Self-pay

## 2019-12-05 ENCOUNTER — Encounter (HOSPITAL_COMMUNITY): Payer: Self-pay

## 2019-12-05 ENCOUNTER — Emergency Department (HOSPITAL_COMMUNITY): Payer: Medicare HMO

## 2019-12-05 ENCOUNTER — Emergency Department (HOSPITAL_COMMUNITY)
Admission: EM | Admit: 2019-12-05 | Discharge: 2019-12-05 | Disposition: A | Discharge status: Home / Self Care | Payer: Medicare HMO | Attending: Emergency Medicine | Admitting: Emergency Medicine

## 2019-12-05 DIAGNOSIS — J111 Influenza due to unidentified influenza virus with other respiratory manifestations: Secondary | ICD-10-CM | POA: Insufficient documentation

## 2019-12-05 DIAGNOSIS — R0981 Nasal congestion: Secondary | ICD-10-CM | POA: Insufficient documentation

## 2019-12-05 DIAGNOSIS — Z79899 Other long term (current) drug therapy: Secondary | ICD-10-CM | POA: Diagnosis not present

## 2019-12-05 DIAGNOSIS — Z20822 Contact with and (suspected) exposure to covid-19: Secondary | ICD-10-CM | POA: Insufficient documentation

## 2019-12-05 DIAGNOSIS — F17210 Nicotine dependence, cigarettes, uncomplicated: Secondary | ICD-10-CM | POA: Insufficient documentation

## 2019-12-05 DIAGNOSIS — I10 Essential (primary) hypertension: Secondary | ICD-10-CM | POA: Insufficient documentation

## 2019-12-05 DIAGNOSIS — R6889 Other general symptoms and signs: Secondary | ICD-10-CM

## 2019-12-05 DIAGNOSIS — R509 Fever, unspecified: Secondary | ICD-10-CM | POA: Diagnosis not present

## 2019-12-05 LAB — COMPREHENSIVE METABOLIC PANEL
ALT: 22 U/L (ref 0–44)
AST: 21 U/L (ref 15–41)
Albumin: 4.5 g/dL (ref 3.5–5.0)
Alkaline Phosphatase: 81 U/L (ref 38–126)
Anion gap: 11 (ref 5–15)
BUN: 18 mg/dL (ref 8–23)
CO2: 27 mmol/L (ref 22–32)
Calcium: 9.3 mg/dL (ref 8.9–10.3)
Chloride: 99 mmol/L (ref 98–111)
Creatinine, Ser: 1.03 mg/dL (ref 0.61–1.24)
GFR calc Af Amer: >60 mL/min (ref >60)
GFR calc non Af Amer: >60 mL/min (ref >60)
Glucose, Bld: 113 mg/dL — ABNORMAL HIGH (ref 70–99)
Potassium: 4.2 mmol/L (ref 3.5–5.1)
Sodium: 137 mmol/L (ref 135–145)
Total Bilirubin: 1.3 mg/dL — ABNORMAL HIGH (ref 0.3–1.2)
Total Protein: 8.1 g/dL (ref 6.5–8.1)

## 2019-12-05 LAB — URINALYSIS, ROUTINE W REFLEX MICROSCOPIC
Bacteria, UA: NONE SEEN
Bilirubin Urine: NEGATIVE
Glucose, UA: 50 mg/dL — AB
Hgb urine dipstick: NEGATIVE
Ketones, ur: 5 mg/dL — AB
Leukocytes,Ua: NEGATIVE
Nitrite: NEGATIVE
Protein, ur: 100 mg/dL — AB
Specific Gravity, Urine: 1.016 (ref 1.005–1.030)
pH: 5 (ref 5.0–8.0)

## 2019-12-05 LAB — CBC WITH DIFFERENTIAL/PLATELET
Abs Immature Granulocytes: 0.07 10*3/uL (ref 0.00–0.07)
Basophils Absolute: 0 10*3/uL (ref 0.0–0.1)
Basophils Relative: 0 %
Eosinophils Absolute: 0 10*3/uL (ref 0.0–0.5)
Eosinophils Relative: 0 %
HCT: 46.5 % (ref 39.0–52.0)
Hemoglobin: 15.9 g/dL (ref 13.0–17.0)
Immature Granulocytes: 1 %
Lymphocytes Relative: 14 %
Lymphs Abs: 2.1 10*3/uL (ref 0.7–4.0)
MCH: 29.8 pg (ref 26.0–34.0)
MCHC: 34.2 g/dL (ref 30.0–36.0)
MCV: 87.1 fL (ref 80.0–100.0)
Monocytes Absolute: 1.7 10*3/uL — ABNORMAL HIGH (ref 0.1–1.0)
Monocytes Relative: 11 %
Neutro Abs: 11.6 10*3/uL — ABNORMAL HIGH (ref 1.7–7.7)
Neutrophils Relative %: 74 %
Platelets: 202 10*3/uL (ref 150–400)
RBC: 5.34 MIL/uL (ref 4.22–5.81)
RDW: 13.2 % (ref 11.5–15.5)
WBC: 15.5 10*3/uL — ABNORMAL HIGH (ref 4.0–10.5)
nRBC: 0 % (ref 0.0–0.2)

## 2019-12-05 LAB — SARS CORONAVIRUS 2 BY RT PCR (HOSPITAL ORDER, PERFORMED IN ~~LOC~~ HOSPITAL LAB): SARS Coronavirus 2: NEGATIVE

## 2019-12-05 LAB — LACTIC ACID, PLASMA: Lactic Acid, Venous: 1.6 mmol/L (ref 0.5–1.9)

## 2019-12-05 MED ORDER — ACETAMINOPHEN 325 MG PO TABS
650.0000 mg | ORAL_TABLET | Freq: Once | ORAL | Status: AC
  Administered 2019-12-05: 650 mg via ORAL
  Filled 2019-12-05: qty 2

## 2019-12-05 MED ORDER — IBUPROFEN 800 MG PO TABS
800.0000 mg | ORAL_TABLET | Freq: Once | ORAL | Status: AC
  Administered 2019-12-05: 800 mg via ORAL
  Filled 2019-12-05: qty 1

## 2019-12-05 NOTE — ED Triage Notes (Signed)
Pt sts coming to ER for HTN with a headache, and generalized body aches. Fever 101.8 in triage.

## 2019-12-05 NOTE — ED Provider Notes (Signed)
[facility] DEPT Provider Note   CSN: [id number] Arrival date & time: 12/05/19  0531     History Chief Complaint  Patient presents with  . Generalized Body Aches  . Fever    [name] [name] is a 74 y.o. male. The history is provided by the patient.  Fever Temp source:  Subjective Severity:  Mild Onset quality:  Gradual Duration:  1 day Timing:  Intermittent Progression:  Waxing and waning Chronicity:  New Relieved by:  Nothing Worsened by:  Nothing Associated symptoms: congestion, headaches and myalgias   Associated symptoms: no chest pain, no chills, no confusion, no cough, no diarrhea, no dysuria, no ear pain, no nausea, no rash, no rhinorrhea, no somnolence, no sore throat and no vomiting   Risk factors: no sick contacts        Past Medical History:  Diagnosis Date  . Allergy   . Arthritis   . BPH (benign prostatic hyperplasia)   . ED (erectile dysfunction)   . GERD (gastroesophageal reflux disease)   . Heart murmur    hx  . Hyperlipidemia   . Hypertension   . LBP (low back pain)   . PONV (postoperative nausea and vomiting)    with epidural inj  . Vitamin B 12 deficiency     Patient Active Problem List   Diagnosis Date Noted  . 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 Knee pain, right 08/26/2019  . Urinary urgency 11/05/2018  . Hyperglycemia 12/25/2017  . Vitamin D deficiency 06/26/2017  . Leg swelling 07/11/2016  . Colon polyp 06/25/2016  . Chest pain, atypical 06/22/2015  . Scalp pain 06/01/2015  . History of colonic polyps 09/15/2014  . Prostatitis 09/15/2014  . Rash and nonspecific skin eruption 11/23/2013  . Conjunctivitis, left eye 10/15/2013  . Asthmatic bronchitis 09/09/2013  . Allergic rhinitis 09/09/2013  . Well adult exam 09/03/2012  . Elevated PSA 09/03/2012  . Erectile dysfunction 06/04/2012  . Fall due to wet surface 08/15/2011  . Neck pain 08/15/2011  . Concussion 08/15/2011  . Headache(784.0)  05/16/2009  . SHOULDER PAIN, RIGHT 04/16/2009  . BENIGN PROSTATIC HYPERTROPHY 03/30/2009  . LOW BACK PAIN 03/30/2009  . Dyslipidemia 10/16/2007  . Essential hypertension 06/18/2007  . FREQUENCY, URINARY 06/18/2007  . B12 deficiency 05/19/2007    Past Surgical History:  Procedure Laterality Date  . BACK SURGERY     fusion of lower back   . COLONOSCOPY    . KNEE ARTHROSCOPY  02   Right  . POLYPECTOMY         Family History  Problem Relation Age of Onset  . Cirrhosis Mother   . Alcohol abuse Mother   . Cirrhosis Father   . Alcohol abuse Father   . Heart disease Sister 67       MI  . Colon cancer Neg Hx   . Colon polyps Neg Hx   . Esophageal cancer Neg Hx   . Rectal cancer Neg Hx   . Stomach cancer Neg Hx     Social History   Tobacco Use  . Smoking status: Current Some Day Smoker    Packs/day: 0.25    Years: 41.00    Pack years: 10.25    Types: Cigars, Cigarettes  . Smokeless tobacco: Never Used  . Tobacco comment: 1-2/d  occ smoke   occ drink liquor  Vaping Use  . Vaping Use: Never used  Substance Use Topics  . Alcohol use: Yes    Comment: Occasional  .  Drug use: No    Home Medications Prior to Admission medications   Medication Sig Start Date End Date Taking? Authorizing Provider  amLODipine-olmesartan (AZOR) 10-40 MG tablet Take 1 tablet by mouth daily. 05/07/19   Plotnikov, Evie Lacks, MD  B Complex Vitamins (VITAMIN B COMPLEX) TABS Take 1 tablet by mouth daily. 10/13/14   [provider]  carvedilol (COREG) 25 MG tablet 1TAKE 1 TABLET (25 MG TOTAL) BY MOUTH 2 (TWO) TIMES DAILY WITH A MEAL. 05/07/19   Plotnikov, Evie Lacks, MD  Cholecalciferol (VITAMIN D3) 2000 units capsule Take 1 capsule (2,000 Units total) by mouth daily. 03/01/17   Plotnikov, Evie Lacks, MD  clobetasol cream (TEMOVATE) 2.70 % Apply 1 application topically 2 (two) times daily. 07/12/18   Jearld Fenton, NP  diclofenac Sodium (VOLTAREN) 1 % GEL Apply 4 g topically 4 (four) times  daily. R knee 08/26/19   Plotnikov, Evie Lacks, MD  fluticasone (FLONASE) 50 MCG/ACT nasal spray Place 2 sprays into both nostrils daily. 07/17/19   Plotnikov, Evie Lacks, MD  fluticasone furoate-vilanterol (BREO ELLIPTA) 100-25 MCG/INH AEPB Inhale 1 puff into the lungs daily. 01/21/19   Plotnikov, Evie Lacks, MD  levocetirizine (XYZAL) 5 MG tablet TAKE 1 TABLET BY MOUTH EVERY DAY IN THE EVENING 03/17/19   Plotnikov, Evie Lacks, MD  meloxicam (MOBIC) 15 MG tablet Take 1 tablet (15 mg total) by mouth daily as needed for pain. 08/26/19   Plotnikov, Evie Lacks, MD  Multiple Vitamins-Minerals (SENIOR MULTIVITAMIN PLUS) TABS Take 1 tablet by mouth daily. 10/11/14   [provider]  tadalafil (CIALIS) 5 MG tablet Take 1 tablet (5 mg total) by mouth daily. 11/18/19   Plotnikov, Evie Lacks, MD  spironolactone (ALDACTONE) 50 MG tablet Take 50 mg by mouth daily. For blood pressure   08/15/11  [provider]    Allergies    Hydrochlorothiazide  Review of Systems   Review of Systems  Constitutional: Positive for fever. Negative for chills.  HENT: Positive for congestion. Negative for ear pain, rhinorrhea and sore throat.   Eyes: Negative for pain and visual disturbance.  Respiratory: Negative for cough and shortness of breath.   Cardiovascular: Negative for chest pain and palpitations.  Gastrointestinal: Negative for abdominal pain, diarrhea, nausea and vomiting.  Genitourinary: Negative for dysuria and hematuria.  Musculoskeletal: Positive for myalgias. Negative for arthralgias and back pain.  Skin: Negative for color change and rash.  Neurological: Positive for headaches. Negative for seizures and syncope.  Psychiatric/Behavioral: Negative for confusion.  All other systems reviewed and are negative.   Physical Exam Updated Vital Signs  ED Triage Vitals  Enc Vitals Group     BP 12/05/19 0539 (!) 170/93     Pulse Rate 12/05/19 0539 62     Resp 12/05/19 0539 18     Temp 12/05/19 0539 98.4  F (36.9 C)     Temp Source 12/05/19 0539 Oral     SpO2 12/05/19 0539 97 %     Weight 12/05/19 0539 222 lb (100.7 kg)     Height 12/05/19 0539 6' (1.829 m)     Head Circumference --      Peak Flow --      Pain Score 12/05/19 0547 9     Pain Loc --      Pain Edu? --      Excl. in [place]? --     Physical Exam Vitals and nursing note reviewed.  Constitutional:      General: He is  not in acute distress.    Appearance: He is well-developed. He is not ill-appearing.  HENT:     Head: Normocephalic and atraumatic.     Right Ear: Tympanic membrane normal.     Left Ear: Tympanic membrane normal.     Nose: Nose normal.     Mouth/Throat:     Mouth: Mucous membranes are moist.  Eyes:     Extraocular Movements: Extraocular movements intact.     Conjunctiva/sclera: Conjunctivae normal.     Pupils: Pupils are equal, round, and reactive to light.  Cardiovascular:     Rate and Rhythm: Normal rate and regular rhythm.     Pulses: Normal pulses.     Heart sounds: Normal heart sounds. No murmur heard.   Pulmonary:     Effort: Pulmonary effort is normal. No respiratory distress.     Breath sounds: Normal breath sounds.  Abdominal:     General: Abdomen is flat.     Palpations: Abdomen is soft.     Tenderness: There is no abdominal tenderness.  Musculoskeletal:     Cervical back: Normal range of motion and neck supple. No tenderness.  Skin:    General: Skin is warm and dry.     Capillary Refill: Capillary refill takes less than 2 seconds.  Neurological:     General: No focal deficit present.     Mental Status: He is alert and oriented to person, place, and time.     Cranial Nerves: No cranial nerve deficit.     Sensory: No sensory deficit.     Motor: No weakness.     Coordination: Coordination normal.     Comments: 5+ 5+ out of 5 strength throughout, normal sensation, no drift, normal speech  Psychiatric:        Mood and Affect: Mood normal.     ED Results / Procedures / Treatments     Labs (all labs ordered are listed, but only abnormal results are displayed) Labs Reviewed  CBC WITH DIFFERENTIAL/PLATELET - Abnormal; Notable for the following components:      Result Value   WBC 15.5 (*)    Neutro Abs 11.6 (*)    Monocytes Absolute 1.7 (*)    All other components within normal limits  COMPREHENSIVE METABOLIC PANEL - Abnormal; Notable for the following components:   Glucose, Bld 113 (*)    Total Bilirubin 1.3 (*)    All other components within normal limits  URINALYSIS, ROUTINE W REFLEX MICROSCOPIC - Abnormal; Notable for the following components:   Glucose, UA 50 (*)    Ketones, ur 5 (*)    Protein, ur 100 (*)    All other components within normal limits  SARS CORONAVIRUS 2 BY RT PCR (HOSPITAL ORDER, [facility])  CULTURE, BLOOD (ROUTINE X 2)  CULTURE, BLOOD (ROUTINE X 2)  URINE CULTURE  LACTIC ACID, PLASMA    EKG None  Radiology DG Chest 2 View  Result Date: 12/05/2019 CLINICAL DATA:  Fever body aches. EXAM: CHEST - 2 VIEW COMPARISON:  05/30/2013 FINDINGS: The heart size and mediastinal contours are within normal limits. Both lungs are clear. The visualized skeletal structures are unremarkable. IMPRESSION: No active cardiopulmonary disease. Electronically Signed   By: Franchot Gallo M.D.   On: 12/05/2019 07:30    Procedures Procedures (including critical care time)  Medications Ordered in ED Medications  acetaminophen (TYLENOL) tablet 650 mg (650 mg Oral Given 12/05/19 0552)  ibuprofen (ADVIL) tablet 800 mg (800 mg Oral  Given 12/05/19 1151)    ED Course  I have reviewed the triage vital signs and the nursing notes.  Pertinent labs & imaging results that were available during my care of the patient were reviewed by me and considered in my medical decision making (see chart for details).    MDM Rules/Calculators/A&P                          [name] [name] is a 74 year old male with history of hypertension who presents to  the ED with body aches, fever.  Patient with fever but otherwise normal vitals.  Has body aches.  No specific shortness of breath, cough, sputum production.  No urinary symptoms.  No abdominal pain.  No signs to suggest meningitis.  Overall he is well-appearing.  Denies any nausea or vomiting.  Lab work shows mild leukocytosis but otherwise no significant anemia, electrolyte abnormality, kidney injury.  Lactic acid is normal.  Chest x-ray with no signs of infection.  Urinalysis negative for infection.  Covid test is negative.  Patient is not immunosuppressed, does not use IV drugs.  Overall suspect viral type process.  Fever improved with Tylenol Motrin.  Blood cultures were collected the patient to be discharged for further symptomatic care at home. No concern for sepsis.  Recommend follow-up with primary care doctor and understands return precautions.  This chart was dictated using voice recognition software.  Despite best efforts to proofread,  errors can occur which can change the documentation meaning.    Final Clinical Impression(s) / ED Diagnoses Final diagnoses:  Flu-like symptoms    Rx / DC Orders ED Discharge Orders    None       Lennice Sites, DO 12/05/19 1303

## 2019-12-05 NOTE — ED Notes (Signed)
IV removed from L arm.

## 2019-12-06 LAB — URINE CULTURE: Culture: NO GROWTH

## 2019-12-07 ENCOUNTER — Telehealth (INDEPENDENT_AMBULATORY_CARE_PROVIDER_SITE_OTHER): Payer: Medicare HMO | Admitting: Family

## 2019-12-07 DIAGNOSIS — J019 Acute sinusitis, unspecified: Secondary | ICD-10-CM | POA: Diagnosis not present

## 2019-12-07 DIAGNOSIS — R519 Headache, unspecified: Secondary | ICD-10-CM | POA: Diagnosis not present

## 2019-12-07 DIAGNOSIS — R509 Fever, unspecified: Secondary | ICD-10-CM

## 2019-12-07 MED ORDER — DOXYCYCLINE HYCLATE 100 MG PO TABS
100.0000 mg | ORAL_TABLET | Freq: Two times a day (BID) | ORAL | 0 refills | Status: DC
Start: 2019-12-07 — End: 2020-06-23

## 2019-12-07 NOTE — Progress Notes (Signed)
[name] [name] is a 74 y.o. male with the following history as recorded in [employer]:  Patient Active Problem List   Diagnosis Date Noted  . 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 Knee pain, right 08/26/2019  . Urinary urgency 11/05/2018  . Hyperglycemia 12/25/2017  . Vitamin D deficiency 06/26/2017  . Leg swelling 07/11/2016  . Colon polyp 06/25/2016  . Chest pain, atypical 06/22/2015  . Scalp pain 06/01/2015  . History of colonic polyps 09/15/2014  . Prostatitis 09/15/2014  . Rash and nonspecific skin eruption 11/23/2013  . Conjunctivitis, left eye 10/15/2013  . Asthmatic bronchitis 09/09/2013  . Allergic rhinitis 09/09/2013  . Well adult exam 09/03/2012  . Elevated PSA 09/03/2012  . Erectile dysfunction 06/04/2012  . Fall due to wet surface 08/15/2011  . Neck pain 08/15/2011  . Concussion 08/15/2011  . Headache(784.0) 05/16/2009  . SHOULDER PAIN, RIGHT 04/16/2009  . BENIGN PROSTATIC HYPERTROPHY 03/30/2009  . LOW BACK PAIN 03/30/2009  . Dyslipidemia 10/16/2007  . Essential hypertension 06/18/2007  . FREQUENCY, URINARY 06/18/2007  . B12 deficiency 05/19/2007    Current Outpatient Medications  Medication Sig Dispense Refill  . amLODipine-olmesartan (AZOR) 10-40 MG tablet Take 1 tablet by mouth daily. 90 tablet 3  . B Complex Vitamins (VITAMIN B COMPLEX) TABS Take 1 tablet by mouth daily.    . carvedilol (COREG) 25 MG tablet 1TAKE 1 TABLET (25 MG TOTAL) BY MOUTH 2 (TWO) TIMES DAILY WITH A MEAL. 180 tablet 3  . Cholecalciferol (VITAMIN D3) 2000 units capsule Take 1 capsule (2,000 Units total) by mouth daily. 100 capsule 3  . clobetasol cream (TEMOVATE) 9.62 % Apply 1 application topically 2 (two) times daily. 30 g 0  . diclofenac Sodium (VOLTAREN) 1 % GEL Apply 4 g topically 4 (four) times daily. R knee 100 g 3  . doxycycline (VIBRA-TABS) 100 MG tablet Take 1 tablet (100 mg total) by mouth 2 (two) times daily. 20 tablet 0  . fluticasone (FLONASE) 50 MCG/ACT nasal spray Place 2  sprays into both nostrils daily. 16 g 3  . fluticasone furoate-vilanterol (BREO ELLIPTA) 100-25 MCG/INH AEPB Inhale 1 puff into the lungs daily. 1 each 5  . levocetirizine (XYZAL) 5 MG tablet TAKE 1 TABLET BY MOUTH EVERY DAY IN THE EVENING 90 tablet 1  . meloxicam (MOBIC) 15 MG tablet Take 1 tablet (15 mg total) by mouth daily as needed for pain. 30 tablet 3  . Multiple Vitamins-Minerals (SENIOR MULTIVITAMIN PLUS) TABS Take 1 tablet by mouth daily.    . tadalafil (CIALIS) 5 MG tablet Take 1 tablet (5 mg total) by mouth daily. 30 tablet 11   No current facility-administered medications for this visit.    Allergies: Hydrochlorothiazide  Past Medical History:  Diagnosis Date  . Allergy   . Arthritis   . BPH (benign prostatic hyperplasia)   . ED (erectile dysfunction)   . GERD (gastroesophageal reflux disease)   . Heart murmur    hx  . Hyperlipidemia   . Hypertension   . LBP (low back pain)   . PONV (postoperative nausea and vomiting)    with epidural inj  . Vitamin B 12 deficiency     Past Surgical History:  Procedure Laterality Date  . BACK SURGERY     fusion of lower back   . COLONOSCOPY    . KNEE ARTHROSCOPY  02   Right  . POLYPECTOMY      Family History  Problem Relation Age of Onset  . Cirrhosis Mother   .  Alcohol abuse Mother   . Cirrhosis Father   . Alcohol abuse Father   . Heart disease Sister 29       MI  . Colon cancer Neg Hx   . Colon polyps Neg Hx   . Esophageal cancer Neg Hx   . Rectal cancer Neg Hx   . Stomach cancer Neg Hx     Social History   Tobacco Use  . Smoking status: Current Some Day Smoker    Packs/day: 0.25    Years: 41.00    Pack years: 10.25    Types: Cigars, Cigarettes  . Smokeless tobacco: Never Used  . Tobacco comment: 1-2/d  occ smoke   occ drink liquor  Substance Use Topics  . Alcohol use: Yes    Comment: Occasional    Subjective:    I connected with Franchot Erichsen on 12/07/19 at  2:00 PM EDT by a video enabled telemedicine  application and verified that I am speaking with the correct person using two identifiers.   I discussed the limitations of evaluation and management by telemedicine and the availability of in person appointments. The patient expressed understanding and agreed to proceed. Provider in office/ patient is at home; provider and patient are only 2 people on video call.   Patient went to the ER on 7/17 with concerns for severe body aches/ recurrent fevers and worsening headaches; patient had work that included labs, urine and CXR- low suspicion for meningitis; thought to be viral in nature and discharged with symptomatic treatment; Patient is concerned about the persisting and worsening nature of these headaches. 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    Objective:  There were no vitals filed for this visit.  General: Well developed, well nourished, in no acute distress  Skin : Warm and dry.  Head: Normocephalic and atraumatic  Lungs: Respirations unlabored; clear to auscultation bilaterally without wheeze, rales, rhonchi  Neurologic: Alert and oriented; speech intact; face symmetrical;   Assessment:  1. Nonintractable headache, unspecified chronicity pattern, unspecified headache type   2. Acute sinusitis, recurrence not specified, unspecified location   3. Fever, unspecified fever cause     Plan:  Reviewed ER notes and evaluation there was unremarkable; will go ahead and start Doxycycline to cover for possible sinus infection; due to worsening headaches, agree that CT is appropriate and will try to get this done within 24 hours; patient in agreement and will not drive until further notice; follow-up to be determined.  No follow-ups on file.  Orders Placed This Encounter  Procedures  . CT Head Wo Contrast    Standing Status:   Future    Standing Expiration Date:   12/06/2020    Order Specific Question:   Preferred imaging  location?    Answer:   [id number]    Order Specific Question:   Radiology Contrast Protocol - do NOT remove file path    Answer:   [email or web address]    Requested Prescriptions   Signed Prescriptions Disp Refills  . doxycycline (VIBRA-TABS) 100 MG tablet 20 tablet 0    Sig: Take 1 tablet (100 mg total) by mouth 2 (two) times daily.

## 2019-12-08 ENCOUNTER — Ambulatory Visit
Admission: RE | Admit: 2019-12-08 | Discharge: 2019-12-08 | Disposition: A | Discharge status: Home / Self Care | Payer: Medicare HMO | Source: Ambulatory Visit | Attending: Family | Admitting: Family

## 2019-12-08 DIAGNOSIS — R519 Headache, unspecified: Secondary | ICD-10-CM

## 2019-12-10 LAB — CULTURE, BLOOD (ROUTINE X 2)
Culture: NO GROWTH
Special Requests: ADEQUATE

## 2020-01-11 DIAGNOSIS — M1711 Unilateral primary osteoarthritis, right knee: Secondary | ICD-10-CM | POA: Diagnosis not present

## 2020-01-18 DIAGNOSIS — M1711 Unilateral primary osteoarthritis, right knee: Secondary | ICD-10-CM | POA: Diagnosis not present

## 2020-01-27 DIAGNOSIS — M1711 Unilateral primary osteoarthritis, right knee: Secondary | ICD-10-CM | POA: Diagnosis not present

## 2020-01-28 ENCOUNTER — Other Ambulatory Visit: Payer: Self-pay | Admitting: Internal Medicine

## 2020-01-28 DIAGNOSIS — R519 Headache, unspecified: Secondary | ICD-10-CM

## 2020-02-18 ENCOUNTER — Encounter: Payer: Self-pay | Admitting: Internal Medicine

## 2020-02-18 ENCOUNTER — Ambulatory Visit (INDEPENDENT_AMBULATORY_CARE_PROVIDER_SITE_OTHER): Payer: Medicare HMO | Admitting: Internal Medicine

## 2020-02-18 ENCOUNTER — Ambulatory Visit: Payer: Medicare HMO | Admitting: Internal Medicine

## 2020-02-18 ENCOUNTER — Other Ambulatory Visit: Payer: Self-pay

## 2020-02-18 VITALS — BP 160/88 | HR 51 | Temp 98.4°F | Ht 72.0 in | Wt 227.0 lb

## 2020-02-18 DIAGNOSIS — E538 Deficiency of other specified B group vitamins: Secondary | ICD-10-CM | POA: Diagnosis not present

## 2020-02-18 DIAGNOSIS — E785 Hyperlipidemia, unspecified: Secondary | ICD-10-CM

## 2020-02-18 DIAGNOSIS — R739 Hyperglycemia, unspecified: Secondary | ICD-10-CM | POA: Diagnosis not present

## 2020-02-18 DIAGNOSIS — Z23 Encounter for immunization: Secondary | ICD-10-CM

## 2020-02-18 DIAGNOSIS — I10 Essential (primary) hypertension: Secondary | ICD-10-CM

## 2020-02-18 DIAGNOSIS — Z0000 Encounter for general adult medical examination without abnormal findings: Secondary | ICD-10-CM

## 2020-02-18 MED ORDER — LEVOCETIRIZINE DIHYDROCHLORIDE 5 MG PO TABS: ORAL_TABLET | ORAL | 1 refills | Status: DC

## 2020-02-18 MED ORDER — CARVEDILOL 25 MG PO TABS: ORAL_TABLET | ORAL | 3 refills | Status: DC

## 2020-02-18 NOTE — Addendum Note (Signed)
Addended by: Darlys Gales on: 02/18/2020 04:47 PM   Modules accepted: Orders

## 2020-02-18 NOTE — Progress Notes (Signed)
Subjective:  Patient ID: [name] [name], male    DOB: Aug 04, 1945  Age: 74 y.o. MRN: [id number]  CC: No chief complaint on file.   HPI [name] [name] presents for HTN, knee OA - worse after gel injection  Pain 5/10 at rest - worse w/activity in the knees.   BP nl at home   Outpatient Medications Prior to Visit  Medication Sig Dispense Refill  . amLODipine-olmesartan (AZOR) 10-40 MG tablet Take 1 tablet by mouth daily. 90 tablet 3  . B Complex Vitamins (VITAMIN B COMPLEX) TABS Take 1 tablet by mouth daily.    . carvedilol (COREG) 25 MG tablet 1TAKE 1 TABLET (25 MG TOTAL) BY MOUTH 2 (TWO) TIMES DAILY WITH A MEAL. 180 tablet 3  . Cholecalciferol (VITAMIN D3) 2000 units capsule Take 1 capsule (2,000 Units total) by mouth daily. 100 capsule 3  . clobetasol cream (TEMOVATE) 3.79 % Apply 1 application topically 2 (two) times daily. 30 g 0  . diclofenac Sodium (VOLTAREN) 1 % GEL Apply 4 g topically 4 (four) times daily. R knee 100 g 3  . doxycycline (VIBRA-TABS) 100 MG tablet Take 1 tablet (100 mg total) by mouth 2 (two) times daily. 20 tablet 0  . fluticasone (FLONASE) 50 MCG/ACT nasal spray PLACE 2 SPRAYS INTO BOTH NOSTRILS DAILY. 48 g 2  . fluticasone furoate-vilanterol (BREO ELLIPTA) 100-25 MCG/INH AEPB Inhale 1 puff into the lungs daily. 1 each 5  . levocetirizine (XYZAL) 5 MG tablet TAKE 1 TABLET BY MOUTH EVERY DAY IN THE EVENING 90 tablet 1  . meloxicam (MOBIC) 15 MG tablet Take 1 tablet (15 mg total) by mouth daily as needed for pain. 30 tablet 3  . Multiple Vitamins-Minerals (SENIOR MULTIVITAMIN PLUS) TABS Take 1 tablet by mouth daily.    . tadalafil (CIALIS) 5 MG tablet Take 1 tablet (5 mg total) by mouth daily. 30 tablet 11   No facility-administered medications prior to visit.    ROS: Review of Systems  Constitutional: Negative for appetite change, fatigue and unexpected weight change.  HENT: Negative for congestion, nosebleeds, sneezing, sore throat and trouble swallowing.    Eyes: Negative for itching and visual disturbance.  Respiratory: Negative for cough.   Cardiovascular: Negative for chest pain, palpitations and leg swelling.  Gastrointestinal: Negative for abdominal distention, blood in stool, diarrhea and nausea.  Genitourinary: Negative for frequency and hematuria.  Musculoskeletal: Positive for arthralgias and gait problem. Negative for back pain, joint swelling and neck pain.  Skin: Negative for rash.  Neurological: Negative for dizziness, tremors, speech difficulty and weakness.  Psychiatric/Behavioral: Negative for agitation, dysphoric mood and sleep disturbance. The patient is not nervous/anxious.     Objective:  BP (!) 160/88 (BP Location: Left Arm, Patient Position: Sitting, Cuff Size: Large)   Pulse (!) 51   Temp 98.4 F (36.9 C) (Oral)   Ht 6' (1.829 m)   Wt 227 lb (103 kg)   SpO2 98%   BMI 30.79 kg/m   BP Readings from Last 3 Encounters:  02/18/20 (!) 160/88  12/05/19 (!) 141/73  11/18/19 122/74    Wt Readings from Last 3 Encounters:  02/18/20 227 lb (103 kg)  12/05/19 222 lb (100.7 kg)  11/18/19 226 lb (102.5 kg)    Physical Exam Constitutional:      General: He is not in acute distress.    Appearance: He is well-developed.     Comments: NAD  Eyes:     Conjunctiva/sclera: Conjunctivae normal.  Pupils: Pupils are equal, round, and reactive to light.  Neck:     Thyroid: No thyromegaly.     Vascular: No JVD.  Cardiovascular:     Rate and Rhythm: Normal rate and regular rhythm.     Heart sounds: Normal heart sounds. No murmur heard.  No friction rub. No gallop.   Pulmonary:     Effort: Pulmonary effort is normal. No respiratory distress.     Breath sounds: Normal breath sounds. No wheezing or rales.  Chest:     Chest wall: No tenderness.  Abdominal:     General: Bowel sounds are normal. There is no distension.     Palpations: Abdomen is soft. There is no mass.     Tenderness: There is no abdominal tenderness.  There is no guarding or rebound.  Musculoskeletal:        General: Tenderness present. Normal range of motion.     Cervical back: Normal range of motion.  Lymphadenopathy:     Cervical: No cervical adenopathy.  Skin:    General: Skin is warm and dry.     Findings: No rash.  Neurological:     Mental Status: He is alert and oriented to person, place, and time.     Cranial Nerves: No cranial nerve deficit.     Motor: No abnormal muscle tone.     Coordination: Coordination normal.     Gait: Gait normal.     Deep Tendon Reflexes: Reflexes are normal and symmetric.  Psychiatric:        Behavior: Behavior normal.        Thought Content: Thought content normal.        Judgment: Judgment normal.     Lab Results  Component Value Date   WBC 15.5 (H) 12/05/2019   HGB 15.9 12/05/2019   HCT 46.5 12/05/2019   PLT 202 12/05/2019   GLUCOSE 113 (H) 12/05/2019   CHOL 175 05/07/2019   TRIG 191.0 (H) 05/07/2019   HDL 36.90 (L) 05/07/2019   LDLDIRECT 134.0 02/08/2012   LDLCALC 100 (H) 05/07/2019   ALT 22 12/05/2019   AST 21 12/05/2019   NA 137 12/05/2019   K 4.2 12/05/2019   CL 99 12/05/2019   CREATININE 1.03 12/05/2019   BUN 18 12/05/2019   CO2 27 12/05/2019   TSH 2.57 05/07/2019   PSA 1.05 05/07/2019   HGBA1C 5.3 04/24/2018    CT Head Wo Contrast  Result Date: 12/08/2019 CLINICAL DATA:  Non intractable headache, unspecified chronicity pattern, unspecified headache type. Additional provided: Patient reports headaches, symptoms for 3 days, unable to sleep. EXAM: CT HEAD WITHOUT CONTRAST TECHNIQUE: Contiguous axial images were obtained from the base of the skull through the vertex without intravenous contrast. COMPARISON:  No pertinent prior studies available for comparison. FINDINGS: Brain: Cerebral volume is normal for age. There is no acute intracranial hemorrhage. No demarcated cortical infarct. No extra-axial fluid collection. No evidence of intracranial mass. No midline shift.  Vascular: No hyperdense vessel. Skull: Normal. Negative for fracture or focal lesion. Sinuses/Orbits: Visualized orbits show no acute finding. No significant paranasal sinus disease or mastoid effusion at the imaged levels. IMPRESSION: Unremarkable non-contrast CT appearance of the brain. No evidence of acute intracranial abnormality. Electronically Signed   By: Kellie Simmering DO   On: 12/08/2019 17:59    Assessment & Plan:    Walker Kehr, MD

## 2020-02-18 NOTE — Assessment & Plan Note (Signed)
Coreg, Benicar and Amlodipine

## 2020-02-18 NOTE — Assessment & Plan Note (Signed)
On B12 Risks associated with treatment noncompliance were discussed. Compliance was encouraged.

## 2020-02-22 ENCOUNTER — Ambulatory Visit: Payer: Medicare HMO | Admitting: Internal Medicine

## 2020-03-03 ENCOUNTER — Ambulatory Visit (INDEPENDENT_AMBULATORY_CARE_PROVIDER_SITE_OTHER): Payer: Medicare HMO

## 2020-03-03 DIAGNOSIS — Z0000 Encounter for general adult medical examination without abnormal findings: Secondary | ICD-10-CM

## 2020-03-03 NOTE — Patient Instructions (Addendum)
Mr. [name] [name] , Thank you for taking time to come for your Medicare Wellness Visit. I appreciate your ongoing commitment to your health goals. Please review the following plan we discussed and let me know if I can assist you in the future.   Screening recommendations/referrals: Colonoscopy: 08/28/2016; due every 10 years Recommended yearly ophthalmology/optometry visit for glaucoma screening and checkup Recommended yearly dental visit for hygiene and checkup  Vaccinations: Influenza vaccine: 02/18/2020 Pneumococcal vaccine: up to date Tdap vaccine: 02/18/2020; due every 10 years Shingles vaccine: up to date   Covid-19: up to date (scheduled to get booster)  Advanced directives: Advance directive discussed with you today. Even though you declined this today please call our office should you change your mind and we can give you the proper paperwork for you to fill out.  Conditions/risks identified: Yes; Reviewed health maintenance screenings with patient today and relevant education, vaccines, and/or referrals were provided. Please continue to do your personal lifestyle choices by: daily care of teeth and gums, regular physical activity (goal should be 5 days a week for 30 minutes), eat a healthy diet, avoid tobacco and drug use, limiting any alcohol intake, taking a low-dose aspirin (if not allergic or have been advised by your provider otherwise) and taking vitamins and minerals as recommended by your provider. Continue doing brain stimulating activities (puzzles, reading, adult coloring books, staying active) to keep memory sharp. Continue to eat heart healthy diet (full of fruits, vegetables, whole grains, lean protein, water--limit salt, fat, and sugar intake) and increase physical activity as tolerated.  Next appointment: Please schedule your next Medicare Wellness Visit with your Nurse Health Advisor in 1 year by calling [phone].  Preventive Care 80 Years and Older, Male Preventive care  refers to lifestyle choices and visits with your health care provider that can promote health and wellness. What does preventive care include?  A yearly physical exam. This is also called an annual well check.  Dental exams once or twice a year.  Routine eye exams. Ask your health care provider how often you should have your eyes checked.  Personal lifestyle choices, including:  Daily care of your teeth and gums.  Regular physical activity.  Eating a healthy diet.  Avoiding tobacco and drug use.  Limiting alcohol use.  Practicing safe sex.  Taking low doses of aspirin every day.  Taking vitamin and mineral supplements as recommended by your health care provider. What happens during an annual well check? The services and screenings done by your health care provider during your annual well check will depend on your age, overall health, lifestyle risk factors, and family history of disease. Counseling  Your health care provider may ask you questions about your:  Alcohol use.  Tobacco use.  Drug use.  Emotional well-being.  Home and relationship well-being.  Sexual activity.  Eating habits.  History of falls.  Memory and ability to understand (cognition).  Work and work Statistician. Screening  You may have the following tests or measurements:  Height, weight, and BMI.  Blood pressure.  Lipid and cholesterol levels. These may be checked every 5 years, or more frequently if you are over 11 years old.  Skin check.  Lung cancer screening. You may have this screening every year starting at age 48 if you have a 30-pack-year history of smoking and currently smoke or have quit within the past 15 years.  Fecal occult blood test (FOBT) of the stool. You may have this test every year starting at age 61.  Flexible sigmoidoscopy or colonoscopy. You may have a sigmoidoscopy every 5 years or a colonoscopy every 10 years starting at age 3.  Prostate cancer screening.  Recommendations will vary depending on your family history and other risks.  Hepatitis C blood test.  Hepatitis B blood test.  Sexually transmitted disease (STD) testing.  Diabetes screening. This is done by checking your blood sugar (glucose) after you have not eaten for a while (fasting). You may have this done every 1-3 years.  Abdominal aortic aneurysm (AAA) screening. You may need this if you are a current or former smoker.  Osteoporosis. You may be screened starting at age 46 if you are at high risk. Talk with your health care provider about your test results, treatment options, and if necessary, the need for more tests. Vaccines  Your health care provider may recommend certain vaccines, such as:  Influenza vaccine. This is recommended every year.  Tetanus, diphtheria, and acellular pertussis (Tdap, Td) vaccine. You may need a Td booster every 10 years.  Zoster vaccine. You may need this after age 69.  Pneumococcal 13-valent conjugate (PCV13) vaccine. One dose is recommended after age 31.  Pneumococcal polysaccharide (PPSV23) vaccine. One dose is recommended after age 54. Talk to your health care provider about which screenings and vaccines you need and how often you need them. This information is not intended to replace advice given to you by your health care provider. Make sure you discuss any questions you have with your health care provider. Document Released: 06/03/2015 Document Revised: 01/25/2016 Document Reviewed: 03/08/2015 Elsevier Interactive Patient Education  2017 [employer] Prevention in the Home Falls can cause injuries. They can happen to people of all ages. There are many things you can do to make your home safe and to help prevent falls. What can I do on the outside of my home?  Regularly fix the edges of walkways and driveways and fix any cracks.  Remove anything that might make you trip as you walk through a door, such as a raised step or  threshold.  Trim any bushes or trees on the path to your home.  Use bright outdoor lighting.  Clear any walking paths of anything that might make someone trip, such as rocks or tools.  Regularly check to see if handrails are loose or broken. Make sure that both sides of any steps have handrails.  Any raised decks and porches should have guardrails on the edges.  Have any leaves, snow, or ice cleared regularly.  Use sand or salt on walking paths during winter.  Clean up any spills in your garage right away. This includes oil or grease spills. What can I do in the bathroom?  Use night lights.  Install grab bars by the toilet and in the tub and shower. Do not use towel bars as grab bars.  Use non-skid mats or decals in the tub or shower.  If you need to sit down in the shower, use a plastic, non-slip stool.  Keep the floor dry. Clean up any water that spills on the floor as soon as it happens.  Remove soap buildup in the tub or shower regularly.  Attach bath mats securely with double-sided non-slip rug tape.  Do not have throw rugs and other things on the floor that can make you trip. What can I do in the bedroom?  Use night lights.  Make sure that you have a light by your bed that is easy to reach.  Do  not use any sheets or blankets that are too big for your bed. They should not hang down onto the floor.  Have a firm chair that has side arms. You can use this for support while you get dressed.  Do not have throw rugs and other things on the floor that can make you trip. What can I do in the kitchen?  Clean up any spills right away.  Avoid walking on wet floors.  Keep items that you use a lot in easy-to-reach places.  If you need to reach something above you, use a strong step stool that has a grab bar.  Keep electrical cords out of the way.  Do not use floor polish or wax that makes floors slippery. If you must use wax, use non-skid floor wax.  Do not have  throw rugs and other things on the floor that can make you trip. What can I do with my stairs?  Do not leave any items on the stairs.  Make sure that there are handrails on both sides of the stairs and use them. Fix handrails that are broken or loose. Make sure that handrails are as long as the stairways.  Check any carpeting to make sure that it is firmly attached to the stairs. Fix any carpet that is loose or worn.  Avoid having throw rugs at the top or bottom of the stairs. If you do have throw rugs, attach them to the floor with carpet tape.  Make sure that you have a light switch at the top of the stairs and the bottom of the stairs. If you do not have them, ask someone to add them for you. What else can I do to help prevent falls?  Wear shoes that:  Do not have high heels.  Have rubber bottoms.  Are comfortable and fit you well.  Are closed at the toe. Do not wear sandals.  If you use a stepladder:  Make sure that it is fully opened. Do not climb a closed stepladder.  Make sure that both sides of the stepladder are locked into place.  Ask someone to hold it for you, if possible.  Clearly mark and make sure that you can see:  Any grab bars or handrails.  First and last steps.  Where the edge of each step is.  Use tools that help you move around (mobility aids) if they are needed. These include:  Canes.  Walkers.  Scooters.  Crutches.  Turn on the lights when you go into a dark area. Replace any light bulbs as soon as they burn out.  Set up your furniture so you have a clear path. Avoid moving your furniture around.  If any of your floors are uneven, fix them.  If there are any pets around you, be aware of where they are.  Review your medicines with your doctor. Some medicines can make you feel dizzy. This can increase your chance of falling. Ask your doctor what other things that you can do to help prevent falls. This information is not intended to  replace advice given to you by your health care provider. Make sure you discuss any questions you have with your health care provider. Document Released: 03/03/2009 Document Revised: 10/13/2015 Document Reviewed: 06/11/2014 Elsevier Interactive Patient Education  2017 [employer].

## 2020-03-03 NOTE — Progress Notes (Signed)
I connected with [name] [name]. [place] today by telephone and verified that I am speaking with the correct person using two identifiers. Location patient: home Location provider: work Persons participating in the virtual visit: [name]. Joneen Caraway and Espino Fritzi Scripter, LPN.   I discussed the limitations, risks, security and privacy concerns of performing an evaluation and management service by telephone and the availability of in person appointments. I also discussed with the patient that there may be a patient responsible charge related to this service. The patient expressed understanding and verbally consented to this telephonic visit.    Interactive audio and video telecommunications were attempted between this provider and patient, however failed, due to patient having technical difficulties OR patient did not have access to video capability.  We continued and completed visit with audio only.  Some vital signs may be absent or patient reported.   Time Spent with patient on telephone encounter: 20 minutes  Subjective:   [name] [name] is a 74 y.o. male who presents for Medicare Annual/Subsequent preventive examination.  Review of Systems    No ROS. Medicare Wellness Visit. Additional risk factors are reflected in social history. Cardiac Risk Factors include: advanced age (>66men, >101 women);dyslipidemia;hypertension;male gender;smoking/ tobacco exposure Sleep Patterns: No sleep issues, feels rested on waking and sleeps 6-8 hours nightly. Home Safety/Smoke Alarms: Feels safe in home; uses home alarm. Smoke alarms in place. Living environment: split-level home; Lives with spouse; no needs for DME; good support system. Seat Belt Safety/Bike Helmet: Wears seat belt.    Objective:    There were no vitals filed for this visit. There is no height or weight on file to calculate BMI.  Advanced Directives 03/03/2020 12/05/2019 06/26/2017 08/28/2016 08/14/2016 03/07/2012 02/27/2012  Does Patient Have a  Medical Advance Directive? No No No No No Patient does not have advance directive Patient does not have advance directive  Would patient like information on creating a medical advance directive? No - Patient declined - Yes (ED - Information included in AVS) - - - -    Current Medications (verified) Outpatient Encounter Medications as of 03/03/2020  Medication Sig  . amLODipine-olmesartan (AZOR) 10-40 MG tablet Take 1 tablet by mouth daily.  . B Complex Vitamins (VITAMIN B COMPLEX) TABS Take 1 tablet by mouth daily.  . carvedilol (COREG) 25 MG tablet 1TAKE 1 TABLET (25 MG TOTAL) BY MOUTH 2 (TWO) TIMES DAILY WITH A MEAL.  Marland Kitchen Cholecalciferol (VITAMIN D3) 2000 units capsule Take 1 capsule (2,000 Units total) by mouth daily.  . clobetasol cream (TEMOVATE) 1.60 % Apply 1 application topically 2 (two) times daily.  . diclofenac Sodium (VOLTAREN) 1 % GEL Apply 4 g topically 4 (four) times daily. R knee  . doxycycline (VIBRA-TABS) 100 MG tablet Take 1 tablet (100 mg total) by mouth 2 (two) times daily.  . fluticasone (FLONASE) 50 MCG/ACT nasal spray PLACE 2 SPRAYS INTO BOTH NOSTRILS DAILY.  . fluticasone furoate-vilanterol (BREO ELLIPTA) 100-25 MCG/INH AEPB Inhale 1 puff into the lungs daily.  Marland Kitchen levocetirizine (XYZAL) 5 MG tablet TAKE 1 TABLET BY MOUTH EVERY DAY IN THE EVENING  . meloxicam (MOBIC) 15 MG tablet Take 1 tablet (15 mg total) by mouth daily as needed for pain.  . Multiple Vitamins-Minerals (SENIOR MULTIVITAMIN PLUS) TABS Take 1 tablet by mouth daily.  . tadalafil (CIALIS) 5 MG tablet Take 1 tablet (5 mg total) by mouth daily.  . [DISCONTINUED] spironolactone (ALDACTONE) 50 MG tablet Take 50 mg by mouth daily. For blood pressure  No facility-administered encounter medications on file as of 03/03/2020.    Allergies (verified) Hydrochlorothiazide   History: Past Medical History:  Diagnosis Date  . Allergy   . Arthritis   . BPH (benign prostatic hyperplasia)   . ED (erectile  dysfunction)   . GERD (gastroesophageal reflux disease)   . Heart murmur    hx  . Hyperlipidemia   . Hypertension   . LBP (low back pain)   . PONV (postoperative nausea and vomiting)    with epidural inj  . Vitamin B 12 deficiency    Past Surgical History:  Procedure Laterality Date  . BACK SURGERY     fusion of lower back   . COLONOSCOPY    . KNEE ARTHROSCOPY  02   Right  . POLYPECTOMY     Family History  Problem Relation Age of Onset  . Cirrhosis Mother   . Alcohol abuse Mother   . Cirrhosis Father   . Alcohol abuse Father   . Heart disease Sister 52       MI  . Colon cancer Neg Hx   . Colon polyps Neg Hx   . Esophageal cancer Neg Hx   . Rectal cancer Neg Hx   . Stomach cancer Neg Hx    Social History   Socioeconomic History  . Marital status: Single    Spouse name: Not on file  . Number of children: Not on file  . Years of education: Not on file  . Highest education level: Not on file  Occupational History  . Occupation: DRIVER    Employer: [employer]    Comment: night shift  Tobacco Use  . Smoking status: Current Some Day Smoker    Packs/day: 0.25    Years: 41.00    Pack years: 10.25    Types: Cigars, Cigarettes  . Smokeless tobacco: Never Used  . Tobacco comment: 1-2/d  occ smoke   occ drink liquor  Vaping Use  . Vaping Use: Never used  Substance and Sexual Activity  . Alcohol use: Yes    Comment: Occasional  . Drug use: No  . Sexual activity: Yes  Other Topics Concern  . Not on file  Social History Narrative   Family history of hypertension   Social Determinants of Health   Financial Resource Strain: Low Risk   . Difficulty of Paying Living Expenses: Not hard at all  Food Insecurity: No Food Insecurity  . Worried About Charity fundraiser in the Last Year: Never true  . Ran Out of Food in the Last Year: Never true  Transportation Needs: No Transportation Needs  . Lack of Transportation (Medical): No  . Lack of Transportation  (Non-Medical): No  Physical Activity: Sufficiently Active  . Days of Exercise per Week: 5 days  . Minutes of Exercise per Session: 30 min  Stress: No Stress Concern Present  . Feeling of Stress : Not at all  Social Connections: Socially Integrated  . Frequency of Communication with Friends and Family: More than three times a week  . Frequency of Social Gatherings with Friends and Family: More than three times a week  . Attends Religious Services: More than 4 times per year  . Active Member of Clubs or Organizations: Yes  . Attends Archivist Meetings: More than 4 times per year  . Marital Status: Married    Tobacco Counseling Ready to quit: Not Answered Counseling given: Not Answered Comment: 1-2/d  occ smoke   occ drink  liquor   Clinical Intake:  Pre-visit preparation completed: Yes  Pain : No/denies pain     Nutritional Risks: None Diabetes: No  How often do you need to have someone help you when you read instructions, pamphlets, or other written materials from your doctor or pharmacy?: 1 - Never What is the last grade level you completed in school?: HSG  Diabetic? No  Interpreter Needed?: No  Information entered by :: Sarie Stall N. Jera Headings, LPN   Activities of Daily Living In your present state of health, do you have any difficulty performing the following activities: 03/03/2020  Hearing? N  Vision? N  Difficulty concentrating or making decisions? N  Walking or climbing stairs? N  Dressing or bathing? N  Doing errands, shopping? N  Preparing Food and eating ? N  Using the Toilet? N  In the past six months, have you accidently leaked urine? N  Do you have problems with loss of bowel control? N  Managing your Medications? N  Managing your Finances? N  Housekeeping or managing your Housekeeping? N  Some recent data might be hidden    Patient Care Team: Plotnikov, Evie Lacks, MD as PCP - General Plotnikov, Evie Lacks, MD Irene Shipper, MD as  Consulting Physician (Gastroenterology)  Indicate any recent Medical Services you may have received from other than [facility] providers in the past year (date may be approximate).     Assessment:   This is a routine wellness examination for [facility].  Hearing/Vision screen No exam data present  Dietary issues and exercise activities discussed: Current Exercise Habits: The patient has a physically strenuous job, but has no regular exercise apart from work.;Home exercise routine, Type of exercise: walking;Other - see comments (yard work), Time (Minutes): 30, Frequency (Times/Week): 5, Weekly Exercise (Minutes/Week): 150, Intensity: Moderate, Exercise limited by: None identified  Goals    . Patient Stated     Decrease the amount of hours I work so that I have more time for myself to enjoy life and family. Take the time to travel and do what I want to do.      Depression Screen PHQ 2/9 Scores 03/03/2020 11/18/2019 11/05/2018 06/26/2017 12/26/2016 12/20/2015 12/15/2014  PHQ - 2 Score 0 0 0 0 0 0 0  PHQ- 9 Score - - - 0 - - -    Fall Risk Fall Risk  03/03/2020 11/18/2019 11/05/2018 06/26/2017 12/26/2016  Falls in the past year? 0 0 0 No No  Number falls in past yr: 0 0 - - -  Injury with Fall? 0 0 - - -  Risk for fall due to : No Fall Risks - - - -  Follow up Falls evaluation completed - Falls evaluation completed - -    Any stairs in or around the home? Yes  If so, are there any without handrails? No  Home free of loose throw rugs in walkways, pet beds, electrical cords, etc? Yes  Adequate lighting in your home to reduce risk of falls? Yes   ASSISTIVE DEVICES UTILIZED TO PREVENT FALLS:  Life alert? No  Use of a cane, walker or w/c? No  Grab bars in the bathroom? No  Shower chair or bench in shower? No  Elevated toilet seat or a handicapped toilet? No   TIMED UP AND GO:  Was the test performed? No .  Length of time to ambulate 10 feet: 0 sec.   Gait steady and fast without use of assistive  device  Cognitive Function: not indicated;  patient is cogitatively intact.        Immunizations Immunization History  Administered Date(s) Administered  . Fluad Quad(high Dose 65+) 05/07/2019, 02/18/2020  . Influenza, High Dose Seasonal PF 03/04/2013, 06/25/2016, 03/01/2017, 03/07/2018  . Influenza,inj,Quad PF,6+ Mos 01/13/2014, 06/22/2015  . PFIZER SARS-COV-2 Vaccination 07/05/2019, 07/28/2019  . Pneumococcal Conjugate-13 09/09/2013  . Pneumococcal Polysaccharide-23 09/03/2012  . Td 03/30/2009  . Tdap 02/18/2020  . Zoster 02/14/2010  . Zoster Recombinat (Shingrix) 12/25/2017, 05/16/2018    TDAP status: Up to date Flu Vaccine status: Up to date Pneumococcal vaccine status: Up to date Covid-19 vaccine status: Completed vaccines  Qualifies for Shingles Vaccine? Yes   Zostavax completed Yes   Shingrix Completed?: Yes  Screening Tests Health Maintenance  Topic Date Due  . COLONOSCOPY  08/29/2026  . TETANUS/TDAP  02/17/2030  . INFLUENZA VACCINE  Completed  . COVID-19 Vaccine  Completed  . Hepatitis C Screening  Completed  . PNA vac Low Risk Adult  Completed    Health Maintenance  There are no preventive care reminders to display for this patient.  Colorectal cancer screening: Completed 08/28/2016. Repeat every 10 years  Lung Cancer Screening: (Low Dose CT Chest recommended if Age 6-80 years, 30 pack-year currently smoking OR have quit w/in 15years.) does qualify.   Lung Cancer Screening Referral: no  Additional Screening:  Hepatitis C Screening: does qualify; Completed: Yes Vision Screening: Recommended annual ophthalmology exams for early detection of glaucoma and other disorders of the eye. Is the patient up to date with their annual eye exam?  Yes  Who is the provider or what is the name of the office in which the patient attends annual eye exams? [facility] (scheduled for 04/05/2020) If pt is not established with a provider, would they like to be  referred to a provider to establish care? No .   Dental Screening: Recommended annual dental exams for proper oral hygiene  Community Resource Referral / Chronic Care Management: CRR required this visit?  No   CCM required this visit?  No      Plan:     I have personally reviewed and noted the following in the patient's chart:   . Medical and social history . Use of alcohol, tobacco or illicit drugs  . Current medications and supplements . Functional ability and status . Nutritional status . Physical activity . Advanced directives . List of other physicians . Hospitalizations, surgeries, and ER visits in previous 12 months . Vitals . Screenings to include cognitive, depression, and falls . Referrals and appointments  In addition, I have reviewed and discussed with patient certain preventive protocols, quality metrics, and best practice recommendations. A written personalized care plan for preventive services as well as general preventive health recommendations were provided to patient.     Sheral Flow, LPN   [id number]   Nurse Notes:  There were no vitals filed for this visit. There is no height or weight on file to calculate BMI. Patient stated that he has no issues with gait or balance; does not use any assistive devices. Patient is very active and still works part-time. Patient goal: To maintain my current health status by continuing to eat healthy, stay physically active and socially active.

## 2020-03-05 ENCOUNTER — Ambulatory Visit: Payer: Medicare HMO | Attending: Internal Medicine

## 2020-03-05 DIAGNOSIS — Z23 Encounter for immunization: Secondary | ICD-10-CM

## 2020-03-05 NOTE — Progress Notes (Signed)
   Covid-19 Vaccination Clinic  Name:  [name] [name]    MRN: [id number] DOB: Nov 02, 1945  03/05/2020  Mr. [name] [name] was observed post Covid-19 immunization for 15 minutes without incident. He was provided with Vaccine Information Sheet and instruction to access the V-Safe system.   Mr. [name] [name] was instructed to call 911 with any severe reactions post vaccine: Marland Kitchen Difficulty breathing  . Swelling of face and throat  . A fast heartbeat  . A bad rash all over body  . Dizziness and weakness

## 2020-03-07 DIAGNOSIS — M1711 Unilateral primary osteoarthritis, right knee: Secondary | ICD-10-CM | POA: Diagnosis not present

## 2020-03-18 ENCOUNTER — Telehealth: Payer: Self-pay | Admitting: Internal Medicine

## 2020-03-18 NOTE — Telephone Encounter (Signed)
Patient brought forms on 10.18.21 and is wondering the status on the documents because they company is stating they never received them.  Patient # [phone]

## 2020-03-21 NOTE — Telephone Encounter (Signed)
Do I have them?  Thanks

## 2020-03-21 NOTE — Telephone Encounter (Signed)
I do not see where I ever received any forms.

## 2020-03-23 NOTE — Telephone Encounter (Signed)
Called pt no answer LMOM form has been faxed back to [employer].Marland KitchenJohny [name]

## 2020-03-23 NOTE — Telephone Encounter (Signed)
Done. Thx.

## 2020-03-23 NOTE — Telephone Encounter (Signed)
Rec'd another form was placed on MD desk.Marland KitchenJohny [name]

## 2020-04-05 DIAGNOSIS — H527 Unspecified disorder of refraction: Secondary | ICD-10-CM | POA: Diagnosis not present

## 2020-04-05 DIAGNOSIS — M25561 Pain in right knee: Secondary | ICD-10-CM | POA: Diagnosis not present

## 2020-04-05 DIAGNOSIS — H25813 Combined forms of age-related cataract, bilateral: Secondary | ICD-10-CM | POA: Diagnosis not present

## 2020-04-05 DIAGNOSIS — M1711 Unilateral primary osteoarthritis, right knee: Secondary | ICD-10-CM | POA: Diagnosis not present

## 2020-04-05 DIAGNOSIS — M25661 Stiffness of right knee, not elsewhere classified: Secondary | ICD-10-CM | POA: Diagnosis not present

## 2020-04-13 DIAGNOSIS — M1711 Unilateral primary osteoarthritis, right knee: Secondary | ICD-10-CM | POA: Diagnosis not present

## 2020-04-13 DIAGNOSIS — Z7689 Persons encountering health services in other specified circumstances: Secondary | ICD-10-CM | POA: Diagnosis not present

## 2020-04-13 DIAGNOSIS — Z01818 Encounter for other preprocedural examination: Secondary | ICD-10-CM | POA: Diagnosis not present

## 2020-04-26 DIAGNOSIS — M1711 Unilateral primary osteoarthritis, right knee: Secondary | ICD-10-CM | POA: Diagnosis not present

## 2020-04-26 DIAGNOSIS — G8918 Other acute postprocedural pain: Secondary | ICD-10-CM | POA: Diagnosis not present

## 2020-04-27 DIAGNOSIS — Z96651 Presence of right artificial knee joint: Secondary | ICD-10-CM | POA: Diagnosis not present

## 2020-04-27 DIAGNOSIS — M25561 Pain in right knee: Secondary | ICD-10-CM | POA: Diagnosis not present

## 2020-04-28 DIAGNOSIS — M25561 Pain in right knee: Secondary | ICD-10-CM | POA: Diagnosis not present

## 2020-04-28 DIAGNOSIS — M25661 Stiffness of right knee, not elsewhere classified: Secondary | ICD-10-CM | POA: Diagnosis not present

## 2020-05-03 DIAGNOSIS — M25661 Stiffness of right knee, not elsewhere classified: Secondary | ICD-10-CM | POA: Insufficient documentation

## 2020-05-04 DIAGNOSIS — M25661 Stiffness of right knee, not elsewhere classified: Secondary | ICD-10-CM | POA: Diagnosis not present

## 2020-05-04 DIAGNOSIS — M25561 Pain in right knee: Secondary | ICD-10-CM | POA: Diagnosis not present

## 2020-05-06 DIAGNOSIS — M25661 Stiffness of right knee, not elsewhere classified: Secondary | ICD-10-CM | POA: Diagnosis not present

## 2020-05-06 DIAGNOSIS — M25561 Pain in right knee: Secondary | ICD-10-CM | POA: Diagnosis not present

## 2020-05-09 DIAGNOSIS — Z96651 Presence of right artificial knee joint: Secondary | ICD-10-CM | POA: Diagnosis not present

## 2020-05-09 DIAGNOSIS — Z471 Aftercare following joint replacement surgery: Secondary | ICD-10-CM | POA: Diagnosis not present

## 2020-05-09 DIAGNOSIS — M25561 Pain in right knee: Secondary | ICD-10-CM | POA: Diagnosis not present

## 2020-05-09 DIAGNOSIS — M25661 Stiffness of right knee, not elsewhere classified: Secondary | ICD-10-CM | POA: Diagnosis not present

## 2020-05-11 DIAGNOSIS — M25661 Stiffness of right knee, not elsewhere classified: Secondary | ICD-10-CM | POA: Diagnosis not present

## 2020-05-11 DIAGNOSIS — M25561 Pain in right knee: Secondary | ICD-10-CM | POA: Diagnosis not present

## 2020-05-17 DIAGNOSIS — M25561 Pain in right knee: Secondary | ICD-10-CM | POA: Diagnosis not present

## 2020-05-17 DIAGNOSIS — M25661 Stiffness of right knee, not elsewhere classified: Secondary | ICD-10-CM | POA: Diagnosis not present

## 2020-05-19 DIAGNOSIS — M25661 Stiffness of right knee, not elsewhere classified: Secondary | ICD-10-CM | POA: Diagnosis not present

## 2020-05-19 DIAGNOSIS — M25561 Pain in right knee: Secondary | ICD-10-CM | POA: Diagnosis not present

## 2020-05-24 DIAGNOSIS — M25661 Stiffness of right knee, not elsewhere classified: Secondary | ICD-10-CM | POA: Diagnosis not present

## 2020-05-24 DIAGNOSIS — M25561 Pain in right knee: Secondary | ICD-10-CM | POA: Diagnosis not present

## 2020-05-26 ENCOUNTER — Other Ambulatory Visit: Payer: Self-pay | Admitting: Internal Medicine

## 2020-05-27 DIAGNOSIS — M25661 Stiffness of right knee, not elsewhere classified: Secondary | ICD-10-CM | POA: Diagnosis not present

## 2020-05-27 DIAGNOSIS — M25561 Pain in right knee: Secondary | ICD-10-CM | POA: Diagnosis not present

## 2020-05-30 DIAGNOSIS — M25561 Pain in right knee: Secondary | ICD-10-CM | POA: Diagnosis not present

## 2020-05-30 DIAGNOSIS — M25661 Stiffness of right knee, not elsewhere classified: Secondary | ICD-10-CM | POA: Diagnosis not present

## 2020-06-02 DIAGNOSIS — M25561 Pain in right knee: Secondary | ICD-10-CM | POA: Diagnosis not present

## 2020-06-02 DIAGNOSIS — M25661 Stiffness of right knee, not elsewhere classified: Secondary | ICD-10-CM | POA: Diagnosis not present

## 2020-06-07 DIAGNOSIS — M25561 Pain in right knee: Secondary | ICD-10-CM | POA: Diagnosis not present

## 2020-06-07 DIAGNOSIS — M25661 Stiffness of right knee, not elsewhere classified: Secondary | ICD-10-CM | POA: Diagnosis not present

## 2020-06-10 DIAGNOSIS — M25661 Stiffness of right knee, not elsewhere classified: Secondary | ICD-10-CM | POA: Diagnosis not present

## 2020-06-10 DIAGNOSIS — M25561 Pain in right knee: Secondary | ICD-10-CM | POA: Diagnosis not present

## 2020-06-16 DIAGNOSIS — M25561 Pain in right knee: Secondary | ICD-10-CM | POA: Diagnosis not present

## 2020-06-16 DIAGNOSIS — M25661 Stiffness of right knee, not elsewhere classified: Secondary | ICD-10-CM | POA: Diagnosis not present

## 2020-06-20 ENCOUNTER — Ambulatory Visit: Payer: Medicare HMO | Admitting: Internal Medicine

## 2020-06-20 ENCOUNTER — Other Ambulatory Visit: Payer: Self-pay | Admitting: *Deleted

## 2020-06-20 MED ORDER — CARVEDILOL 25 MG PO TABS: ORAL_TABLET | ORAL | 2 refills | Status: DC

## 2020-06-23 ENCOUNTER — Encounter: Payer: Self-pay | Admitting: Internal Medicine

## 2020-06-23 ENCOUNTER — Ambulatory Visit (INDEPENDENT_AMBULATORY_CARE_PROVIDER_SITE_OTHER): Payer: Medicare HMO | Admitting: Internal Medicine

## 2020-06-23 ENCOUNTER — Other Ambulatory Visit: Payer: Self-pay

## 2020-06-23 DIAGNOSIS — J4520 Mild intermittent asthma, uncomplicated: Secondary | ICD-10-CM

## 2020-06-23 DIAGNOSIS — M25661 Stiffness of right knee, not elsewhere classified: Secondary | ICD-10-CM | POA: Diagnosis not present

## 2020-06-23 DIAGNOSIS — M25561 Pain in right knee: Secondary | ICD-10-CM | POA: Diagnosis not present

## 2020-06-23 DIAGNOSIS — E538 Deficiency of other specified B group vitamins: Secondary | ICD-10-CM | POA: Diagnosis not present

## 2020-06-23 DIAGNOSIS — G8929 Other chronic pain: Secondary | ICD-10-CM

## 2020-06-23 DIAGNOSIS — E559 Vitamin D deficiency, unspecified: Secondary | ICD-10-CM

## 2020-06-23 MED ORDER — MELOXICAM 15 MG PO TABS: 15.0000 mg | ORAL_TABLET | Freq: Every day | ORAL | 0 refills | Status: DC | PRN

## 2020-06-23 NOTE — Progress Notes (Signed)
Subjective:  Patient ID: [name] [name], male    DOB: 06-28-1945  Age: 75 y.o. MRN: [id number]  CC: Annual Exam   HPI [name] [name] presents for R knee pain and swelling.she is  R TKR post-op, current plan on steroids PO; a little better. C/o L knee popped recently and became painful F/u HTN, OA, allergies  Outpatient Medications Prior to Visit  Medication Sig Dispense Refill  . amLODipine-olmesartan (AZOR) 10-40 MG tablet TAKE ONE TABLET BY MOUTH ONE TIME DAILY 90 tablet 1  . B Complex Vitamins (VITAMIN B COMPLEX) TABS Take 1 tablet by mouth daily.    . carvedilol (COREG) 25 MG tablet 1TAKE 1 TABLET (25 MG TOTAL) BY MOUTH 2 (TWO) TIMES DAILY WITH A MEAL. 180 tablet 2  . Cholecalciferol (VITAMIN D3) 2000 units capsule Take 1 capsule (2,000 Units total) by mouth daily. 100 capsule 3  . diclofenac Sodium (VOLTAREN) 1 % GEL Apply 4 g topically 4 (four) times daily. R knee 100 g 3  . fluticasone (FLONASE) 50 MCG/ACT nasal spray PLACE 2 SPRAYS INTO BOTH NOSTRILS DAILY. 48 g 2  . fluticasone furoate-vilanterol (BREO ELLIPTA) 100-25 MCG/INH AEPB Inhale 1 puff into the lungs daily. 1 each 5  . levocetirizine (XYZAL) 5 MG tablet TAKE 1 TABLET BY MOUTH EVERY DAY IN THE EVENING 90 tablet 1  . meloxicam (MOBIC) 15 MG tablet Take 1 tablet (15 mg total) by mouth daily as needed for pain. 30 tablet 3  . Multiple Vitamins-Minerals (SENIOR MULTIVITAMIN PLUS) TABS Take 1 tablet by mouth daily.    . tadalafil (CIALIS) 5 MG tablet Take 1 tablet (5 mg total) by mouth daily. 30 tablet 11  . clobetasol cream (TEMOVATE) 3.82 % Apply 1 application topically 2 (two) times daily. 30 g 0  . doxycycline (VIBRA-TABS) 100 MG tablet Take 1 tablet (100 mg total) by mouth 2 (two) times daily. (Patient not taking: Reported on 06/23/2020) 20 tablet 0   No facility-administered medications prior to visit.    ROS: Review of Systems  Constitutional: Negative for appetite change, fatigue and unexpected weight change.  HENT:  Negative for congestion, nosebleeds, sneezing, sore throat and trouble swallowing.   Eyes: Negative for itching and visual disturbance.  Respiratory: Negative for cough.   Cardiovascular: Negative for chest pain, palpitations and leg swelling.  Gastrointestinal: Negative for abdominal distention, blood in stool, diarrhea and nausea.  Genitourinary: Negative for frequency and hematuria.  Musculoskeletal: Positive for arthralgias and gait problem. Negative for back pain, joint swelling and neck pain.  Skin: Negative for rash.  Neurological: Negative for dizziness, tremors, speech difficulty and weakness.  Psychiatric/Behavioral: Negative for agitation, dysphoric mood, sleep disturbance and suicidal ideas. The patient is not nervous/anxious.     Objective:  BP 122/80 (BP Location: Left Arm)   Pulse 63   Temp 99 F (37.2 C) (Oral)   Ht 6\' 1"  (1.854 m)   Wt 230 lb 6.4 oz (104.5 kg)   SpO2 96%   BMI 30.40 kg/m   BP Readings from Last 3 Encounters:  06/23/20 122/80  02/18/20 (!) 160/88  12/05/19 (!) 141/73    Wt Readings from Last 3 Encounters:  06/23/20 230 lb 6.4 oz (104.5 kg)  02/18/20 227 lb (103 kg)  12/05/19 222 lb (100.7 kg)    Physical Exam Constitutional:      General: He is not in acute distress.    Appearance: He is well-developed.     Comments: NAD  HENT:  Mouth/Throat:     Mouth: Oropharynx is clear and moist.  Eyes:     Conjunctiva/sclera: Conjunctivae normal.     Pupils: Pupils are equal, round, and reactive to light.  Neck:     Thyroid: No thyromegaly.     Vascular: No JVD.  Cardiovascular:     Rate and Rhythm: Normal rate and regular rhythm.     Pulses: Intact distal pulses.     Heart sounds: Normal heart sounds. No murmur heard. No friction rub. No gallop.   Pulmonary:     Effort: Pulmonary effort is normal. No respiratory distress.     Breath sounds: Normal breath sounds. No wheezing or rales.  Chest:     Chest wall: No tenderness.  Abdominal:      General: Bowel sounds are normal. There is no distension.     Palpations: Abdomen is soft. There is no mass.     Tenderness: There is no abdominal tenderness. There is no guarding or rebound.  Musculoskeletal:        General: Swelling and tenderness present. No edema. Normal range of motion.     Cervical back: Normal range of motion.  Lymphadenopathy:     Cervical: No cervical adenopathy.  Skin:    General: Skin is warm and dry.     Findings: No rash.  Neurological:     Mental Status: He is alert and oriented to person, place, and time.     Cranial Nerves: No cranial nerve deficit.     Motor: No abnormal muscle tone.     Coordination: He displays a negative Romberg sign. Coordination normal.     Gait: Gait normal.     Deep Tendon Reflexes: Reflexes are normal and symmetric.  Psychiatric:        Mood and Affect: Mood and affect normal.        Behavior: Behavior normal.        Thought Content: Thought content normal.        Judgment: Judgment normal.   R knee w/a scar, swelling, pain; hot to touch L knee - pain w/ROM  Lab Results  Component Value Date   WBC 15.5 (H) 12/05/2019   HGB 15.9 12/05/2019   HCT 46.5 12/05/2019   PLT 202 12/05/2019   GLUCOSE 113 (H) 12/05/2019   CHOL 175 05/07/2019   TRIG 191.0 (H) 05/07/2019   HDL 36.90 (L) 05/07/2019   LDLDIRECT 134.0 02/08/2012   LDLCALC 100 (H) 05/07/2019   ALT 22 12/05/2019   AST 21 12/05/2019   NA 137 12/05/2019   K 4.2 12/05/2019   CL 99 12/05/2019   CREATININE 1.03 12/05/2019   BUN 18 12/05/2019   CO2 27 12/05/2019   TSH 2.57 05/07/2019   PSA 1.05 05/07/2019   HGBA1C 5.3 04/24/2018    CT Head Wo Contrast  Result Date: 12/08/2019 CLINICAL DATA:  Non intractable headache, unspecified chronicity pattern, unspecified headache type. Additional provided: Patient reports headaches, symptoms for 3 days, unable to sleep. EXAM: CT HEAD WITHOUT CONTRAST TECHNIQUE: Contiguous axial images were obtained from the base of  the skull through the vertex without intravenous contrast. COMPARISON:  No pertinent prior studies available for comparison. FINDINGS: Brain: Cerebral volume is normal for age. There is no acute intracranial hemorrhage. No demarcated cortical infarct. No extra-axial fluid collection. No evidence of intracranial mass. No midline shift. Vascular: No hyperdense vessel. Skull: Normal. Negative for fracture or focal lesion. Sinuses/Orbits: Visualized orbits show no acute finding. No significant paranasal sinus disease  or mastoid effusion at the imaged levels. IMPRESSION: Unremarkable non-contrast CT appearance of the brain. No evidence of acute intracranial abnormality. Electronically Signed   By: Kellie Simmering DO   On: 12/08/2019 17:59    Assessment & Plan:

## 2020-06-23 NOTE — Assessment & Plan Note (Addendum)
Continue with Breo Xyzal Flonase

## 2020-06-23 NOTE — Assessment & Plan Note (Addendum)
The patient is status post TKR R  Dr Theda Sers.  He is 2 months out.  There is some inflammation going on.  She is in therapy.  He was recently prescribed prednisone with minor improvement. He will follow up with Dr. Theda Sers.  He will use Voltaren gel 4 times a day

## 2020-06-23 NOTE — Assessment & Plan Note (Addendum)
On B12.  Continue vitamin B12 therapy

## 2020-06-23 NOTE — Assessment & Plan Note (Addendum)
Continue with oral daily Vit D

## 2020-06-24 ENCOUNTER — Other Ambulatory Visit: Payer: Self-pay | Admitting: *Deleted

## 2020-06-24 NOTE — Telephone Encounter (Signed)
Duplicate request med was sent 1// to [facility] for the Carvedilol.Marland KitchenJohny [name]

## 2020-06-27 MED ORDER — CARVEDILOL 25 MG PO TABS: ORAL_TABLET | ORAL | 2 refills | Status: DC

## 2020-06-27 NOTE — Addendum Note (Signed)
Addended by: Earnstine Regal on: 06/27/2020 02:41 PM   Modules accepted: Orders

## 2020-06-28 DIAGNOSIS — M25561 Pain in right knee: Secondary | ICD-10-CM | POA: Diagnosis not present

## 2020-06-28 DIAGNOSIS — M25661 Stiffness of right knee, not elsewhere classified: Secondary | ICD-10-CM | POA: Diagnosis not present

## 2020-07-05 DIAGNOSIS — M25561 Pain in right knee: Secondary | ICD-10-CM | POA: Diagnosis not present

## 2020-07-05 DIAGNOSIS — M25661 Stiffness of right knee, not elsewhere classified: Secondary | ICD-10-CM | POA: Diagnosis not present

## 2020-07-25 DIAGNOSIS — M25561 Pain in right knee: Secondary | ICD-10-CM | POA: Diagnosis not present

## 2020-07-25 DIAGNOSIS — Z96651 Presence of right artificial knee joint: Secondary | ICD-10-CM | POA: Diagnosis not present

## 2020-07-25 DIAGNOSIS — Z471 Aftercare following joint replacement surgery: Secondary | ICD-10-CM | POA: Diagnosis not present

## 2020-09-05 DIAGNOSIS — M25561 Pain in right knee: Secondary | ICD-10-CM | POA: Diagnosis not present

## 2020-09-21 ENCOUNTER — Other Ambulatory Visit: Payer: Self-pay

## 2020-09-22 ENCOUNTER — Ambulatory Visit (INDEPENDENT_AMBULATORY_CARE_PROVIDER_SITE_OTHER): Payer: Medicare HMO | Admitting: Internal Medicine

## 2020-09-22 ENCOUNTER — Encounter: Payer: Self-pay | Admitting: Internal Medicine

## 2020-09-22 DIAGNOSIS — M25561 Pain in right knee: Secondary | ICD-10-CM | POA: Diagnosis not present

## 2020-09-22 DIAGNOSIS — R739 Hyperglycemia, unspecified: Secondary | ICD-10-CM | POA: Diagnosis not present

## 2020-09-22 DIAGNOSIS — R972 Elevated prostate specific antigen [PSA]: Secondary | ICD-10-CM

## 2020-09-22 DIAGNOSIS — G8929 Other chronic pain: Secondary | ICD-10-CM

## 2020-09-22 DIAGNOSIS — Z0000 Encounter for general adult medical examination without abnormal findings: Secondary | ICD-10-CM

## 2020-09-22 DIAGNOSIS — E538 Deficiency of other specified B group vitamins: Secondary | ICD-10-CM | POA: Diagnosis not present

## 2020-09-22 DIAGNOSIS — I10 Essential (primary) hypertension: Secondary | ICD-10-CM

## 2020-09-22 DIAGNOSIS — E785 Hyperlipidemia, unspecified: Secondary | ICD-10-CM | POA: Diagnosis not present

## 2020-09-22 DIAGNOSIS — E559 Vitamin D deficiency, unspecified: Secondary | ICD-10-CM

## 2020-09-22 LAB — LIPID PANEL
Cholesterol: 191 mg/dL (ref 0–200)
HDL: 37.1 mg/dL — ABNORMAL LOW (ref >39.00)
LDL Cholesterol: 121 mg/dL — ABNORMAL HIGH (ref 0–99)
NonHDL: 153.79
Total CHOL/HDL Ratio: 5
Triglycerides: 163 mg/dL — ABNORMAL HIGH (ref 0.0–149.0)
VLDL: 32.6 mg/dL (ref 0.0–40.0)

## 2020-09-22 LAB — CBC WITH DIFFERENTIAL/PLATELET
Basophils Absolute: 0.1 10*3/uL (ref 0.0–0.1)
Basophils Relative: 0.9 % (ref 0.0–3.0)
Eosinophils Absolute: 0.2 10*3/uL (ref 0.0–0.7)
Eosinophils Relative: 3.1 % (ref 0.0–5.0)
HCT: 41.4 % (ref 39.0–52.0)
Hemoglobin: 14.2 g/dL (ref 13.0–17.0)
Lymphocytes Relative: 29.9 % (ref 12.0–46.0)
Lymphs Abs: 1.8 10*3/uL (ref 0.7–4.0)
MCHC: 34.3 g/dL (ref 30.0–36.0)
MCV: 83.2 fl (ref 78.0–100.0)
Monocytes Absolute: 1 10*3/uL (ref 0.1–1.0)
Monocytes Relative: 17.2 % — ABNORMAL HIGH (ref 3.0–12.0)
Neutro Abs: 2.9 10*3/uL (ref 1.4–7.7)
Neutrophils Relative %: 48.9 % (ref 43.0–77.0)
Platelets: 160 10*3/uL (ref 150.0–400.0)
RBC: 4.98 Mil/uL (ref 4.22–5.81)
RDW: 14.9 % (ref 11.5–15.5)
WBC: 5.9 10*3/uL (ref 4.0–10.5)

## 2020-09-22 LAB — URINALYSIS
Bilirubin Urine: NEGATIVE
Hgb urine dipstick: NEGATIVE
Ketones, ur: NEGATIVE
Leukocytes,Ua: NEGATIVE
Nitrite: NEGATIVE
Specific Gravity, Urine: 1.02 (ref 1.000–1.030)
Total Protein, Urine: NEGATIVE
Urine Glucose: NEGATIVE
Urobilinogen, UA: 1 (ref 0.0–1.0)
pH: 5.5 (ref 5.0–8.0)

## 2020-09-22 LAB — TSH: TSH: 2.73 u[IU]/mL (ref 0.35–4.50)

## 2020-09-22 LAB — HEMOGLOBIN A1C: Hgb A1c MFr Bld: 5.8 % (ref 4.6–6.5)

## 2020-09-22 LAB — PSA: PSA: 0.71 ng/mL (ref 0.10–4.00)

## 2020-09-22 MED ORDER — METHYLPREDNISOLONE 4 MG PO TBPK
ORAL_TABLET | ORAL | 0 refills | Status: DC
Start: 2020-09-22 — End: 2020-12-27

## 2020-09-22 NOTE — Assessment & Plan Note (Signed)
on Gabapentin per [facility] Med[facility] pack

## 2020-09-22 NOTE — Progress Notes (Signed)
Subjective:  Patient ID: [name] [name], male    DOB: 1945/09/09  Age: 75 y.o. MRN: [id number]  CC: Follow-up (3 MONTH F/U)   HPI [name] [name] presents for R knee post-op pain C/o arthralgias - on Gabapentin per Ortho  Outpatient Medications Prior to Visit  Medication Sig Dispense Refill  . amLODipine-olmesartan (AZOR) 10-40 MG tablet TAKE ONE TABLET BY MOUTH ONE TIME DAILY 90 tablet 1  . B Complex Vitamins (VITAMIN B COMPLEX) TABS Take 1 tablet by mouth daily.    . carvedilol (COREG) 25 MG tablet 1TAKE 1 TABLET (25 MG TOTAL) BY MOUTH 2 (TWO) TIMES DAILY WITH A MEAL. 180 tablet 2  . Cholecalciferol (VITAMIN D3) 2000 units capsule Take 1 capsule (2,000 Units total) by mouth daily. 100 capsule 3  . diclofenac Sodium (VOLTAREN) 1 % GEL Apply 4 g topically 4 (four) times daily. R knee 100 g 3  . fluticasone (FLONASE) 50 MCG/ACT nasal spray PLACE 2 SPRAYS INTO BOTH NOSTRILS DAILY. 48 g 2  . fluticasone furoate-vilanterol (BREO ELLIPTA) 100-25 MCG/INH AEPB Inhale 1 puff into the lungs daily. 1 each 5  . levocetirizine (XYZAL) 5 MG tablet TAKE 1 TABLET BY MOUTH EVERY DAY IN THE EVENING 90 tablet 1  . meloxicam (MOBIC) 15 MG tablet Take 1 tablet (15 mg total) by mouth daily as needed for pain. 90 tablet 0  . Multiple Vitamins-Minerals (SENIOR MULTIVITAMIN PLUS) TABS Take 1 tablet by mouth daily.    . tadalafil (CIALIS) 5 MG tablet Take 1 tablet (5 mg total) by mouth daily. 30 tablet 11   No facility-administered medications prior to visit.    ROS: Review of Systems  Constitutional: Negative for appetite change, fatigue and unexpected weight change.  HENT: Negative for congestion, nosebleeds, sneezing, sore throat and trouble swallowing.   Eyes: Negative for itching and visual disturbance.  Respiratory: Negative for cough.   Cardiovascular: Negative for chest pain, palpitations and leg swelling.  Gastrointestinal: Negative for abdominal distention, blood in stool, diarrhea and nausea.   Genitourinary: Negative for frequency and hematuria.  Musculoskeletal: Positive for arthralgias and gait problem. Negative for back pain, joint swelling and neck pain.  Skin: Negative for rash.  Neurological: Negative for dizziness, tremors, speech difficulty and weakness.  Psychiatric/Behavioral: Negative for agitation, dysphoric mood and sleep disturbance. The patient is not nervous/anxious.     Objective:  BP (!) 126/58 (BP Location: Left Arm)   Pulse (!) 50   Temp 98.6 F (37 C) (Oral)   Ht 6\' 1"  (1.854 m)   Wt 230 lb 9.6 oz (104.6 kg)   SpO2 96%   BMI 30.42 kg/m   BP Readings from Last 3 Encounters:  09/22/20 (!) 126/58  06/23/20 122/80  02/18/20 (!) 160/88    Wt Readings from Last 3 Encounters:  09/22/20 230 lb 9.6 oz (104.6 kg)  06/23/20 230 lb 6.4 oz (104.5 kg)  02/18/20 227 lb (103 kg)    Physical Exam Constitutional:      General: He is not in acute distress.    Appearance: He is well-developed. He is not diaphoretic.     Comments: NAD  HENT:     Head: Normocephalic and atraumatic.     Right Ear: External ear normal.     Left Ear: External ear normal.     Nose: Nose normal.     Mouth/Throat:     Pharynx: No oropharyngeal exudate.  Eyes:     General: No scleral icterus.  Right eye: No discharge.        Left eye: No discharge.     Conjunctiva/sclera: Conjunctivae normal.     Pupils: Pupils are equal, round, and reactive to light.  Neck:     Thyroid: No thyromegaly.     Vascular: No JVD.     Trachea: No tracheal deviation.  Cardiovascular:     Rate and Rhythm: Normal rate and regular rhythm.     Heart sounds: Normal heart sounds. No murmur heard. No friction rub. No gallop.   Pulmonary:     Effort: Pulmonary effort is normal. No respiratory distress.     Breath sounds: Normal breath sounds. No stridor. No wheezing or rales.  Chest:     Chest wall: No tenderness.  Abdominal:     General: Bowel sounds are normal. There is no distension.      Palpations: Abdomen is soft. There is no mass.     Tenderness: There is no abdominal tenderness. There is no guarding or rebound.  Genitourinary:    Penis: Normal. No tenderness.      Prostate: Normal.     Rectum: Normal. Guaiac result negative.  Musculoskeletal:        General: No tenderness. Normal range of motion.     Cervical back: Normal range of motion and neck supple.  Lymphadenopathy:     Cervical: No cervical adenopathy.  Skin:    General: Skin is warm and dry.     Coloration: Skin is not pale.     Findings: No erythema or rash.  Neurological:     Mental Status: He is alert and oriented to person, place, and time.     Cranial Nerves: No cranial nerve deficit.     Motor: No abnormal muscle tone.     Coordination: Coordination normal.     Gait: Gait normal.     Deep Tendon Reflexes: Reflexes are normal and symmetric. Reflexes normal.  Psychiatric:        Behavior: Behavior normal.        Thought Content: Thought content normal.        Judgment: Judgment normal.   Right knee is stiff and slightly swollen.  There is a limp  Lab Results  Component Value Date   WBC 15.5 (H) 12/05/2019   HGB 15.9 12/05/2019   HCT 46.5 12/05/2019   PLT 202 12/05/2019   GLUCOSE 113 (H) 12/05/2019   CHOL 175 05/07/2019   TRIG 191.0 (H) 05/07/2019   HDL 36.90 (L) 05/07/2019   LDLDIRECT 134.0 02/08/2012   LDLCALC 100 (H) 05/07/2019   ALT 22 12/05/2019   AST 21 12/05/2019   NA 137 12/05/2019   K 4.2 12/05/2019   CL 99 12/05/2019   CREATININE 1.03 12/05/2019   BUN 18 12/05/2019   CO2 27 12/05/2019   TSH 2.57 05/07/2019   PSA 1.05 05/07/2019   HGBA1C 5.3 04/24/2018    CT Head Wo Contrast  Result Date: 12/08/2019 CLINICAL DATA:  Non intractable headache, unspecified chronicity pattern, unspecified headache type. Additional provided: Patient reports headaches, symptoms for 3 days, unable to sleep. EXAM: CT HEAD WITHOUT CONTRAST TECHNIQUE: Contiguous axial images were obtained from the  base of the skull through the vertex without intravenous contrast. COMPARISON:  No pertinent prior studies available for comparison. FINDINGS: Brain: Cerebral volume is normal for age. There is no acute intracranial hemorrhage. No demarcated cortical infarct. No extra-axial fluid collection. No evidence of intracranial mass. No midline shift. Vascular: No hyperdense vessel. Skull:  Normal. Negative for fracture or focal lesion. Sinuses/Orbits: Visualized orbits show no acute finding. No significant paranasal sinus disease or mastoid effusion at the imaged levels. IMPRESSION: Unremarkable non-contrast CT appearance of the brain. No evidence of acute intracranial abnormality. Electronically Signed   By: Kellie Simmering DO   On: 12/08/2019 17:59    Assessment & Plan:    Walker Kehr, MD

## 2020-09-22 NOTE — Addendum Note (Signed)
Addended by: Jacobo Forest on: 09/22/2020 10:57 AM   Modules accepted: Orders

## 2020-09-22 NOTE — Assessment & Plan Note (Signed)
On B12 

## 2020-09-22 NOTE — Assessment & Plan Note (Signed)
Check PSA. ?

## 2020-09-22 NOTE — Assessment & Plan Note (Signed)
On Vit D 

## 2020-09-22 NOTE — Assessment & Plan Note (Signed)
Coreg, Benicar and Amlodipine 

## 2020-09-22 NOTE — Patient Instructions (Addendum)
TENS unit for pain Rice sock

## 2020-09-22 NOTE — Assessment & Plan Note (Signed)
Check A1c. 

## 2020-09-24 ENCOUNTER — Encounter: Payer: Self-pay | Admitting: Internal Medicine

## 2020-09-30 DIAGNOSIS — M25561 Pain in right knee: Secondary | ICD-10-CM | POA: Diagnosis not present

## 2020-10-06 DIAGNOSIS — M25561 Pain in right knee: Secondary | ICD-10-CM | POA: Diagnosis not present

## 2020-10-11 DIAGNOSIS — M25561 Pain in right knee: Secondary | ICD-10-CM | POA: Diagnosis not present

## 2020-10-13 DIAGNOSIS — M25561 Pain in right knee: Secondary | ICD-10-CM | POA: Diagnosis not present

## 2020-10-18 DIAGNOSIS — M25561 Pain in right knee: Secondary | ICD-10-CM | POA: Diagnosis not present

## 2020-10-20 DIAGNOSIS — Z96651 Presence of right artificial knee joint: Secondary | ICD-10-CM | POA: Diagnosis not present

## 2020-10-20 DIAGNOSIS — Z471 Aftercare following joint replacement surgery: Secondary | ICD-10-CM | POA: Diagnosis not present

## 2020-10-20 DIAGNOSIS — M25561 Pain in right knee: Secondary | ICD-10-CM | POA: Diagnosis not present

## 2020-10-28 DIAGNOSIS — M25561 Pain in right knee: Secondary | ICD-10-CM | POA: Diagnosis not present

## 2020-11-25 ENCOUNTER — Other Ambulatory Visit: Payer: Self-pay | Admitting: Internal Medicine

## 2020-12-27 ENCOUNTER — Ambulatory Visit (INDEPENDENT_AMBULATORY_CARE_PROVIDER_SITE_OTHER): Payer: Medicare HMO | Admitting: Internal Medicine

## 2020-12-27 ENCOUNTER — Other Ambulatory Visit: Payer: Self-pay

## 2020-12-27 ENCOUNTER — Encounter: Payer: Self-pay | Admitting: Internal Medicine

## 2020-12-27 DIAGNOSIS — M25561 Pain in right knee: Secondary | ICD-10-CM | POA: Diagnosis not present

## 2020-12-27 DIAGNOSIS — E785 Hyperlipidemia, unspecified: Secondary | ICD-10-CM

## 2020-12-27 DIAGNOSIS — E538 Deficiency of other specified B group vitamins: Secondary | ICD-10-CM

## 2020-12-27 DIAGNOSIS — G8929 Other chronic pain: Secondary | ICD-10-CM | POA: Diagnosis not present

## 2020-12-27 NOTE — Patient Instructions (Signed)

## 2020-12-27 NOTE — Assessment & Plan Note (Signed)
On B12 

## 2020-12-27 NOTE — Progress Notes (Signed)
Subjective:  Patient ID: [name] [name], male    DOB: July 22, 1945  Age: 75 y.o. MRN: [id number]  CC: Follow-up (3 month f/u)   HPI [name] [name] presents for HTN, ?ear wax C/o R knee pain - better; using meloxicam  Outpatient Medications Prior to Visit  Medication Sig Dispense Refill   amLODipine-olmesartan (AZOR) 10-40 MG tablet TAKE ONE TABLET BY MOUTH ONE TIME DAILY 90 tablet 1   B Complex Vitamins (VITAMIN B COMPLEX) TABS Take 1 tablet by mouth daily.     carvedilol (COREG) 25 MG tablet 1TAKE 1 TABLET (25 MG TOTAL) BY MOUTH 2 (TWO) TIMES DAILY WITH A MEAL. 180 tablet 2   Cholecalciferol (VITAMIN D3) 2000 units capsule Take 1 capsule (2,000 Units total) by mouth daily. 100 capsule 3   diclofenac Sodium (VOLTAREN) 1 % GEL Apply 4 g topically 4 (four) times daily. R knee 100 g 3   fluticasone (FLONASE) 50 MCG/ACT nasal spray PLACE 2 SPRAYS INTO BOTH NOSTRILS DAILY. 48 g 2   fluticasone furoate-vilanterol (BREO ELLIPTA) 100-25 MCG/INH AEPB Inhale 1 puff into the lungs daily. 1 each 5   levocetirizine (XYZAL) 5 MG tablet TAKE 1 TABLET BY MOUTH EVERY DAY IN THE EVENING 90 tablet 1   meloxicam (MOBIC) 15 MG tablet Take 1 tablet (15 mg total) by mouth daily as needed for pain. 90 tablet 0   Multiple Vitamins-Minerals (SENIOR MULTIVITAMIN PLUS) TABS Take 1 tablet by mouth daily.     tadalafil (CIALIS) 5 MG tablet Take 1 tablet (5 mg total) by mouth daily. 30 tablet 11   methylPREDNISolone (MEDROL DOSEPAK) 4 MG TBPK tablet As directed (Patient not taking: Reported on 12/27/2020) 21 tablet 0   No facility-administered medications prior to visit.    ROS: Review of Systems  Constitutional:  Negative for appetite change, fatigue and unexpected weight change.  HENT:  Positive for hearing loss. Negative for congestion, nosebleeds, sneezing, sore throat and trouble swallowing.   Eyes:  Negative for itching and visual disturbance.  Respiratory:  Negative for cough.   Cardiovascular:  Negative for  chest pain, palpitations and leg swelling.  Gastrointestinal:  Negative for abdominal distention, blood in stool, diarrhea and nausea.  Genitourinary:  Negative for frequency and hematuria.  Musculoskeletal:  Positive for arthralgias. Negative for back pain, gait problem, joint swelling and neck pain.  Skin:  Negative for rash.  Neurological:  Negative for dizziness, tremors, speech difficulty and weakness.  Psychiatric/Behavioral:  Negative for agitation, dysphoric mood and sleep disturbance. The patient is not nervous/anxious.    Objective:  BP 130/72 (BP Location: Left Arm)   Pulse (!) 48   Temp 98 F (36.7 C) (Oral)   Ht '6\' 1"'$  (1.854 m)   Wt 225 lb 6.4 oz (102.2 kg)   SpO2 97%   BMI 29.74 kg/m   BP Readings from Last 3 Encounters:  12/27/20 130/72  09/22/20 (!) 126/58  06/23/20 122/80    Wt Readings from Last 3 Encounters:  12/27/20 225 lb 6.4 oz (102.2 kg)  09/22/20 230 lb 9.6 oz (104.6 kg)  06/23/20 230 lb 6.4 oz (104.5 kg)    Physical Exam Constitutional:      General: He is not in acute distress.    Appearance: He is well-developed.     Comments: NAD  HENT:     Right Ear: Ear canal and external ear normal. There is no impacted cerumen.     Left Ear: Ear canal and external ear normal. There is  no impacted cerumen.  Eyes:     Conjunctiva/sclera: Conjunctivae normal.     Pupils: Pupils are equal, round, and reactive to light.  Neck:     Thyroid: No thyromegaly.     Vascular: No JVD.  Cardiovascular:     Rate and Rhythm: Normal rate and regular rhythm.     Heart sounds: Normal heart sounds. No murmur heard.   No friction rub. No gallop.  Pulmonary:     Effort: Pulmonary effort is normal. No respiratory distress.     Breath sounds: Normal breath sounds. No wheezing or rales.  Chest:     Chest wall: No tenderness.  Abdominal:     General: Bowel sounds are normal. There is no distension.     Palpations: Abdomen is soft. There is no mass.     Tenderness: There  is no abdominal tenderness. There is no guarding or rebound.  Musculoskeletal:        General: Tenderness present. Normal range of motion.     Cervical back: Normal range of motion.  Lymphadenopathy:     Cervical: No cervical adenopathy.  Skin:    General: Skin is warm and dry.     Findings: No rash.  Neurological:     Mental Status: He is alert and oriented to person, place, and time.     Cranial Nerves: No cranial nerve deficit.     Motor: No abnormal muscle tone.     Coordination: Coordination normal.     Gait: Gait normal.     Deep Tendon Reflexes: Reflexes are normal and symmetric.  Psychiatric:        Behavior: Behavior normal.        Thought Content: Thought content normal.        Judgment: Judgment normal.   No wax B  Lab Results  Component Value Date   WBC 5.9 09/22/2020   HGB 14.2 09/22/2020   HCT 41.4 09/22/2020   PLT 160.0 09/22/2020   GLUCOSE 113 (H) 12/05/2019   CHOL 191 09/22/2020   TRIG 163.0 (H) 09/22/2020   HDL 37.10 (L) 09/22/2020   LDLDIRECT 134.0 02/08/2012   LDLCALC 121 (H) 09/22/2020   ALT 22 12/05/2019   AST 21 12/05/2019   NA 137 12/05/2019   K 4.2 12/05/2019   CL 99 12/05/2019   CREATININE 1.03 12/05/2019   BUN 18 12/05/2019   CO2 27 12/05/2019   TSH 2.73 09/22/2020   PSA 0.71 09/22/2020   HGBA1C 5.8 09/22/2020    CT Head Wo Contrast  Result Date: 12/08/2019 CLINICAL DATA:  Non intractable headache, unspecified chronicity pattern, unspecified headache type. Additional provided: Patient reports headaches, symptoms for 3 days, unable to sleep. EXAM: CT HEAD WITHOUT CONTRAST TECHNIQUE: Contiguous axial images were obtained from the base of the skull through the vertex without intravenous contrast. COMPARISON:  No pertinent prior studies available for comparison. FINDINGS: Brain: Cerebral volume is normal for age. There is no acute intracranial hemorrhage. No demarcated cortical infarct. No extra-axial fluid collection. No evidence of  intracranial mass. No midline shift. Vascular: No hyperdense vessel. Skull: Normal. Negative for fracture or focal lesion. Sinuses/Orbits: Visualized orbits show no acute finding. No significant paranasal sinus disease or mastoid effusion at the imaged levels. IMPRESSION: Unremarkable non-contrast CT appearance of the brain. No evidence of acute intracranial abnormality. Electronically Signed   By: Kellie Simmering DO   On: 12/08/2019 17:59    Assessment & Plan:

## 2020-12-27 NOTE — Assessment & Plan Note (Addendum)
Some better  Meloxicam po Ice, TENS Brace

## 2020-12-27 NOTE — Assessment & Plan Note (Signed)
cardiac CT scan for calcium scoring offered - smoking cigars/inhaling

## 2021-02-16 ENCOUNTER — Telehealth: Payer: Self-pay | Admitting: Internal Medicine

## 2021-02-16 DIAGNOSIS — E785 Hyperlipidemia, unspecified: Secondary | ICD-10-CM

## 2021-02-16 NOTE — Telephone Encounter (Signed)
Patient says referral is needed for his CT Calcium scoring test  Please advise patient when complete [phone]

## 2021-02-19 NOTE — Telephone Encounter (Signed)
CT ordered.  Mr. [name] [name] can call them to schedule.  Thank you

## 2021-02-21 NOTE — Telephone Encounter (Signed)
Called pt there was no answer LMOM w/MD response. Also # to call.Marland KitchenJohny [name]

## 2021-03-10 ENCOUNTER — Other Ambulatory Visit: Payer: Self-pay | Admitting: Internal Medicine

## 2021-03-10 DIAGNOSIS — R519 Headache, unspecified: Secondary | ICD-10-CM

## 2021-03-28 ENCOUNTER — Ambulatory Visit
Admission: RE | Admit: 2021-03-28 | Discharge: 2021-03-28 | Disposition: A | Discharge status: Home / Self Care | Payer: Self-pay | Source: Ambulatory Visit | Attending: Internal Medicine | Admitting: Internal Medicine

## 2021-03-28 ENCOUNTER — Other Ambulatory Visit: Payer: Self-pay

## 2021-03-28 DIAGNOSIS — E785 Hyperlipidemia, unspecified: Secondary | ICD-10-CM

## 2021-04-11 ENCOUNTER — Emergency Department (HOSPITAL_COMMUNITY)
Admission: EM | Admit: 2021-04-11 | Discharge: 2021-04-11 | Disposition: A | Discharge status: Home / Self Care | Payer: Medicare HMO | Attending: Student | Admitting: Student

## 2021-04-11 ENCOUNTER — Encounter (HOSPITAL_COMMUNITY): Payer: Self-pay

## 2021-04-11 ENCOUNTER — Emergency Department (HOSPITAL_COMMUNITY): Payer: Medicare HMO

## 2021-04-11 DIAGNOSIS — I10 Essential (primary) hypertension: Secondary | ICD-10-CM | POA: Diagnosis not present

## 2021-04-11 DIAGNOSIS — Y92410 Unspecified street and highway as the place of occurrence of the external cause: Secondary | ICD-10-CM | POA: Insufficient documentation

## 2021-04-11 DIAGNOSIS — J45909 Unspecified asthma, uncomplicated: Secondary | ICD-10-CM | POA: Diagnosis not present

## 2021-04-11 DIAGNOSIS — F17210 Nicotine dependence, cigarettes, uncomplicated: Secondary | ICD-10-CM | POA: Insufficient documentation

## 2021-04-11 DIAGNOSIS — M79632 Pain in left forearm: Secondary | ICD-10-CM | POA: Diagnosis not present

## 2021-04-11 DIAGNOSIS — Z7951 Long term (current) use of inhaled steroids: Secondary | ICD-10-CM | POA: Insufficient documentation

## 2021-04-11 DIAGNOSIS — M542 Cervicalgia: Secondary | ICD-10-CM | POA: Insufficient documentation

## 2021-04-11 DIAGNOSIS — Z96651 Presence of right artificial knee joint: Secondary | ICD-10-CM | POA: Diagnosis not present

## 2021-04-11 DIAGNOSIS — M545 Low back pain, unspecified: Secondary | ICD-10-CM | POA: Insufficient documentation

## 2021-04-11 DIAGNOSIS — M25562 Pain in left knee: Secondary | ICD-10-CM | POA: Diagnosis not present

## 2021-04-11 DIAGNOSIS — M19022 Primary osteoarthritis, left elbow: Secondary | ICD-10-CM | POA: Diagnosis not present

## 2021-04-11 DIAGNOSIS — Z79899 Other long term (current) drug therapy: Secondary | ICD-10-CM | POA: Diagnosis not present

## 2021-04-11 DIAGNOSIS — R519 Headache, unspecified: Secondary | ICD-10-CM | POA: Insufficient documentation

## 2021-04-11 DIAGNOSIS — S0990XA Unspecified injury of head, initial encounter: Secondary | ICD-10-CM | POA: Diagnosis not present

## 2021-04-11 MED ORDER — OXYCODONE-ACETAMINOPHEN 5-325 MG PO TABS
1.0000 | ORAL_TABLET | Freq: Once | ORAL | Status: AC
  Administered 2021-04-11: 1 via ORAL
  Filled 2021-04-11: qty 1

## 2021-04-11 MED ORDER — LIDOCAINE 4 % EX PTCH: 1.0000 | MEDICATED_PATCH | Freq: Two times a day (BID) | CUTANEOUS | 0 refills | Status: AC | PRN
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
## 2021-04-19 DIAGNOSIS — H524 Presbyopia: Secondary | ICD-10-CM | POA: Diagnosis not present

## 2021-04-19 DIAGNOSIS — Z96651 Presence of right artificial knee joint: Secondary | ICD-10-CM | POA: Diagnosis not present

## 2021-04-19 DIAGNOSIS — Z471 Aftercare following joint replacement surgery: Secondary | ICD-10-CM | POA: Diagnosis not present

## 2021-04-20 DIAGNOSIS — H5203 Hypermetropia, bilateral: Secondary | ICD-10-CM | POA: Diagnosis not present

## 2021-04-20 DIAGNOSIS — H52209 Unspecified astigmatism, unspecified eye: Secondary | ICD-10-CM | POA: Diagnosis not present

## 2021-04-20 DIAGNOSIS — H524 Presbyopia: Secondary | ICD-10-CM | POA: Diagnosis not present

## 2021-04-21 ENCOUNTER — Ambulatory Visit (INDEPENDENT_AMBULATORY_CARE_PROVIDER_SITE_OTHER): Payer: Medicare HMO | Admitting: Internal Medicine

## 2021-04-21 ENCOUNTER — Other Ambulatory Visit: Payer: Self-pay

## 2021-04-21 VITALS — BP 122/62 | HR 95 | Ht 73.0 in | Wt 227.0 lb

## 2021-04-21 DIAGNOSIS — G8929 Other chronic pain: Secondary | ICD-10-CM | POA: Diagnosis not present

## 2021-04-21 DIAGNOSIS — M542 Cervicalgia: Secondary | ICD-10-CM

## 2021-04-21 DIAGNOSIS — E559 Vitamin D deficiency, unspecified: Secondary | ICD-10-CM | POA: Diagnosis not present

## 2021-04-21 DIAGNOSIS — M25561 Pain in right knee: Secondary | ICD-10-CM

## 2021-04-21 DIAGNOSIS — I10 Essential (primary) hypertension: Secondary | ICD-10-CM

## 2021-04-21 DIAGNOSIS — M5440 Lumbago with sciatica, unspecified side: Secondary | ICD-10-CM
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
## 2021-04-30 NOTE — Assessment & Plan Note (Signed)
Last vitamin D Lab Results  Component Value Date   VD25OH 24.69 (L) 03/01/2017   Low, to restart oral replacement

## 2021-04-30 NOTE — Assessment & Plan Note (Signed)
C/w msk strain, no further concern for underlying new worsening injury, declines muscle relaxer prn

## 2021-05-08 ENCOUNTER — Ambulatory Visit (INDEPENDENT_AMBULATORY_CARE_PROVIDER_SITE_OTHER): Payer: Medicare HMO

## 2021-05-08 DIAGNOSIS — Z0000 Encounter for general adult medical examination without abnormal findings: Secondary | ICD-10-CM | POA: Diagnosis not present

## 2021-05-08 NOTE — Progress Notes (Addendum)
Subjective:   [name] [name] is a 75 y.o. male who presents for an Subsequent  Medicare Annual Wellness Visit.  I connected with [name] [name] today by telephone and verified that I am speaking with the correct person using two identifiers. Location patient: home Location provider: work Persons participating in the virtual visit: patient, provider.   I discussed the limitations, risks, security and privacy concerns of performing an evaluation and management service by telephone and the availability of in person appointments. I also discussed with the patient that there may be a patient responsible charge related to this service. The patient expressed understanding and verbally consented to this telephonic visit.    Interactive audio and video telecommunications were attempted between this provider and patient, however failed, due to patient having technical difficulties OR patient did not have access to video capability.  We continued and completed visit with audio only.    Review of Systems     Cardiac Risk Factors include: advanced age (>5men, >78 women);dyslipidemia;male gender;hypertension     Objective:    Today's Vitals   05/08/21 1355  PainSc: 4    There is no height or weight on file to calculate BMI.  Advanced Directives 05/08/2021 04/11/2021 03/03/2020 12/05/2019 06/26/2017 08/28/2016 08/14/2016  Does Patient Have a Medical Advance Directive? Yes No No No No No No  Type of Paramedic of [place];Living will - - - - - -  Copy of [place] in Chart? No - copy requested - - - - - -  Would patient like information on creating a medical advance directive? - - No - Patient declined - Yes (ED - Information included in AVS) - -    Current Medications (verified) Outpatient Encounter Medications as of 05/08/2021  Medication Sig   amLODipine-olmesartan (AZOR) 10-40 MG tablet TAKE ONE TABLET BY MOUTH ONE TIME DAILY   B Complex Vitamins  (VITAMIN B COMPLEX) TABS Take 1 tablet by mouth daily.   carvedilol (COREG) 25 MG tablet 1TAKE 1 TABLET (25 MG TOTAL) BY MOUTH 2 (TWO) TIMES DAILY WITH A MEAL.   Cholecalciferol (VITAMIN D3) 2000 units capsule Take 1 capsule (2,000 Units total) by mouth daily.   fluticasone (FLONASE) 50 MCG/ACT nasal spray PLACE 2 SPRAYS INTO BOTH NOSTRILS DAILY.   Lidocaine (HM LIDOCAINE PATCH) 4 % PTCH Apply 1 patch topically every 12 (twelve) hours as needed.   Multiple Vitamins-Minerals (SENIOR MULTIVITAMIN PLUS) TABS Take 1 tablet by mouth daily.   diclofenac Sodium (VOLTAREN) 1 % GEL Apply 4 g topically 4 (four) times daily. R knee (Patient not taking: Reported on 05/08/2021)   fluticasone furoate-vilanterol (BREO ELLIPTA) 100-25 MCG/INH AEPB Inhale 1 puff into the lungs daily. (Patient not taking: Reported on 05/08/2021)   levocetirizine (XYZAL) 5 MG tablet TAKE 1 TABLET BY MOUTH EVERY DAY IN THE EVENING (Patient not taking: Reported on 05/08/2021)   meloxicam (MOBIC) 15 MG tablet TAKE 1 TABLET (15 MG TOTAL) BY MOUTH DAILY AS NEEDED FOR PAIN. (Patient not taking: Reported on 05/08/2021)   tadalafil (CIALIS) 5 MG tablet Take 1 tablet (5 mg total) by mouth daily. (Patient not taking: Reported on 05/08/2021)   [DISCONTINUED] spironolactone (ALDACTONE) 50 MG tablet Take 50 mg by mouth daily. For blood pressure    No facility-administered encounter medications on file as of 05/08/2021.    Allergies (verified) Hydrochlorothiazide   History: Past Medical History:  Diagnosis Date   Allergy    Arthritis    BPH (benign prostatic  hyperplasia)    ED (erectile dysfunction)    GERD (gastroesophageal reflux disease)    Heart murmur    hx   Hyperlipidemia    Hypertension    LBP (low back pain)    PONV (postoperative nausea and vomiting)    with epidural inj   Vitamin B 12 deficiency    Past Surgical History:  Procedure Laterality Date   BACK SURGERY     fusion of lower back    COLONOSCOPY     KNEE  ARTHROSCOPY  02   Right   POLYPECTOMY     Family History  Problem Relation Age of Onset   Cirrhosis Mother    Alcohol abuse Mother    Cirrhosis Father    Alcohol abuse Father    Heart disease Sister 73       MI   Colon cancer Neg Hx    Colon polyps Neg Hx    Esophageal cancer Neg Hx    Rectal cancer Neg Hx    Stomach cancer Neg Hx    Social History   Socioeconomic History   Marital status: Single    Spouse name: Not on file   Number of children: Not on file   Years of education: Not on file   Highest education level: Not on file  Occupational History   Occupation: DRIVER    Employer: [employer]    Comment: night shift  Tobacco Use   Smoking status: Some Days    Packs/day: 0.25    Years: 41.00    Pack years: 10.25    Types: Cigars, Cigarettes   Smokeless tobacco: Never   Tobacco comments:    1-2/d  occ smoke   occ drink liquor  Vaping Use   Vaping Use: Never used  Substance and Sexual Activity   Alcohol use: Yes    Comment: Occasional   Drug use: No   Sexual activity: Yes  Other Topics Concern   Not on file  Social History Narrative   Family history of hypertension   Social Determinants of Health   Financial Resource Strain: Low Risk    Difficulty of Paying Living Expenses: Not hard at all  Food Insecurity: No Food Insecurity   Worried About Charity fundraiser in the Last Year: Never true   Ran Out of Food in the Last Year: Never true  Transportation Needs: No Transportation Needs   Lack of Transportation (Medical): No   Lack of Transportation (Non-Medical): No  Physical Activity: Inactive   Days of Exercise per Week: 0 days   Minutes of Exercise per Session: 0 min  Stress: No Stress Concern Present   Feeling of Stress : Not at all  Social Connections: Moderately Isolated   Frequency of Communication with Friends and Family: Twice a week   Frequency of Social Gatherings with Friends and Family: Twice a week   Attends Religious Services:  Never   Marine scientist or Organizations: No   Attends Music therapist: Never   Marital Status: Living with partner    Tobacco Counseling Ready to quit: Not Answered Counseling given: Not Answered Tobacco comments: 1-2/d  occ smoke   occ drink liquor   Clinical Intake:  Pre-visit preparation completed: Yes  Pain : 0-10 Pain Score: 4  Pain Type: Acute pain Pain Location: Neck Pain Descriptors / Indicators: Aching Pain Onset: 1 to 4 weeks ago Pain Frequency: Occasional Pain Relieving Factors: Tylenol  Pain Relieving Factors: Tylenol  Nutritional  Risks: None  How often do you need to have someone help you when you read instructions, pamphlets, or other written materials from your doctor or pharmacy?: 1 - Never What is the last grade level you completed in school?: [place]   Interpreter Needed?: No  Information entered by :: L.Wylie Russon,LPN   Activities of Daily Living In your present state of health, do you have any difficulty performing the following activities: 05/08/2021  Hearing? N  Vision? N  Difficulty concentrating or making decisions? N  Walking or climbing stairs? N  Dressing or bathing? N  Doing errands, shopping? N  Preparing Food and eating ? N  Using the Toilet? N  In the past six months, have you accidently leaked urine? N  Do you have problems with loss of bowel control? N  Managing your Medications? N  Managing your Finances? N  Housekeeping or managing your Housekeeping? N  Some recent data might be hidden    Patient Care Team: Plotnikov, Evie Lacks, MD as PCP - General Plotnikov, Evie Lacks, MD Irene Shipper, MD as Consulting Physician (Gastroenterology) [facility] as Consulting Physician (Ophthalmology) Sydnee Cabal, MD as Consulting Physician (Orthopedic Surgery)  Indicate any recent Medical Services you may have received from other than [facility] providers in the past year (date may be  approximate).     Assessment:   This is a routine wellness examination for [facility].  Hearing/Vision screen Vision Screening - Comments:: Annual eye exams wear glasses   Dietary issues and exercise activities discussed: Current Exercise Habits: Home exercise routine;The patient does not participate in regular exercise at present   Goals Addressed             This Visit's Progress    Patient Stated   On track    Decrease the amount of hours I work so that I have more time for myself to enjoy life and family. Take the time to travel and do what I want to do.       Depression Screen PHQ 2/9 Scores 05/08/2021 05/08/2021 04/21/2021 03/03/2020 11/18/2019 11/05/2018 06/26/2017  PHQ - 2 Score 0 0 0 0 0 0 0  PHQ- 9 Score - - - - - - 0    Fall Risk Fall Risk  05/08/2021 04/21/2021 03/03/2020 11/18/2019 11/05/2018  Falls in the past year? 0 0 0 0 0  Number falls in past yr: 0 0 0 0 -  Injury with Fall? 0 0 0 0 -  Risk for fall due to : - - No Fall Risks - -  Follow up Falls evaluation completed - Falls evaluation completed - Falls evaluation completed    FALL RISK PREVENTION PERTAINING TO THE HOME:  Any stairs in or around the home? Yes  If so, are there any without handrails? No  Home free of loose throw rugs in walkways, pet beds, electrical cords, etc? Yes  Adequate lighting in your home to reduce risk of falls? Yes   ASSISTIVE DEVICES UTILIZED TO PREVENT FALLS:  Life alert? No  Use of a cane, walker or w/c? No  Grab bars in the bathroom? Yes  Shower chair or bench in shower? No  Elevated toilet seat or a handicapped toilet? No    Cognitive Function:    Normal cognitive status assessed by direct observation by this Nurse Health Advisor. No abnormalities found.      Immunizations Immunization History  Administered Date(s) Administered   Fluad Quad(high Dose 65+) 05/07/2019, 02/18/2020  Influenza, High Dose Seasonal PF 03/04/2013, 06/25/2016, 03/01/2017, 03/07/2018    Influenza,inj,Quad PF,6+ Mos 01/13/2014, 06/22/2015   PFIZER(Purple Top)SARS-COV-2 Vaccination 07/05/2019, 07/28/2019, 03/05/2020   Pneumococcal Conjugate-13 09/09/2013   Pneumococcal Polysaccharide-23 09/03/2012   Td 03/30/2009   Tdap 02/18/2020   Zoster Recombinat (Shingrix) 12/25/2017, 05/16/2018   Zoster, Live 02/14/2010    TDAP status: Up to date  Flu Vaccine status: Up to date  Pneumococcal vaccine status: Up to date  Covid-19 vaccine status: Completed vaccines  Qualifies for Shingles Vaccine? Yes   Zostavax completed Yes   Shingrix Completed?: Yes  Screening Tests Health Maintenance  Topic Date Due   COVID-19 Vaccine (4 - Booster for Pfizer series) 04/30/2020   INFLUENZA VACCINE  12/19/2020   COLONOSCOPY (Pts 45-59yrs Insurance coverage will need to be confirmed)  08/29/2026   TETANUS/TDAP  02/17/2030   Pneumonia Vaccine 17+ Years old  Completed   Hepatitis C Screening  Completed   Zoster Vaccines- Shingrix  Completed   HPV VACCINES  Aged Out    Health Maintenance  Health Maintenance Due  Topic Date Due   COVID-19 Vaccine (4 - Booster for Pfizer series) 04/30/2020   INFLUENZA VACCINE  12/19/2020    Colorectal cancer screening: Type of screening: Colonoscopy. Completed 08/28/2016. Repeat every 10 years  Lung Cancer Screening: (Low Dose CT Chest recommended if Age 46-80 years, 30 pack-year currently smoking OR have quit w/in 15years.) does not qualify.   Lung Cancer Screening Referral: n/a  Additional Screening:  Hepatitis C Screening: does not qualify; Completed 06/23/2015  Vision Screening: Recommended annual ophthalmology exams for early detection of glaucoma and other disorders of the eye. Is the patient up to date with their annual eye exam?  Yes  Who is the provider or what is the name of the office in which the patient attends annual eye exams? [place]  If pt is not established with a provider, would they like to be referred to a provider to  establish care? No .   Dental Screening: Recommended annual dental exams for proper oral hygiene  Community Resource Referral / Chronic Care Management: CRR required this visit?  No   CCM required this visit?  No      Plan:     I have personally reviewed and noted the following in the patients chart:   Medical and social history Use of alcohol, tobacco or illicit drugs  Current medications and supplements including opioid prescriptions. Patient is not currently taking opioid prescriptions. Functional ability and status Nutritional status Physical activity Advanced directives List of other physicians Hospitalizations, surgeries, and ER visits in previous 12 months Vitals Screenings to include cognitive, depression, and falls Referrals and appointments  In addition, I have reviewed and discussed with patient certain preventive protocols, quality metrics, and best practice recommendations. A written personalized care plan for preventive services as well as general preventive health recommendations were provided to patient.     Randel Pigg, LPN   [id number]   Nurse Notes: none     Medical screening examination/treatment/procedure(s) were performed by non-physician practitioner and as supervising physician I was immediately available for consultation/collaboration.  I agree with above. Lew Dawes, MD

## 2021-05-08 NOTE — Patient Instructions (Signed)
Mr. [name] [name] , Thank you for taking time to come for your Medicare Wellness Visit. I appreciate your ongoing commitment to your health goals. Please review the following plan we discussed and let me know if I can assist you in the future.   Screening recommendations/referrals: Colonoscopy: 08/28/2016 Recommended yearly ophthalmology/optometry visit for glaucoma screening and checkup Recommended yearly dental visit for hygiene and checkup  Vaccinations: Influenza vaccine: completed  Pneumococcal vaccine: completed  Tdap vaccine: completed  Shingles vaccine: completed     Advanced directives: will provide copies to PCP   Conditions/risks identified: none   Next appointment: none   Preventive Care 74 Years and Older, Male Preventive care refers to lifestyle choices and visits with your health care provider that can promote health and wellness. What does preventive care include? A yearly physical exam. This is also called an annual well check. Dental exams once or twice a year. Routine eye exams. Ask your health care provider how often you should have your eyes checked. Personal lifestyle choices, including: Daily care of your teeth and gums. Regular physical activity. Eating a healthy diet. Avoiding tobacco and drug use. Limiting alcohol use. Practicing safe sex. Taking low doses of aspirin every day. Taking vitamin and mineral supplements as recommended by your health care provider. What happens during an annual well check? The services and screenings done by your health care provider during your annual well check will depend on your age, overall health, lifestyle risk factors, and family history of disease. Counseling  Your health care provider may ask you questions about your: Alcohol use. Tobacco use. Drug use. Emotional well-being. Home and relationship well-being. Sexual activity. Eating habits. History of falls. Memory and ability to understand (cognition). Work and  work Statistician. Screening  You may have the following tests or measurements: Height, weight, and BMI. Blood pressure. Lipid and cholesterol levels. These may be checked every 5 years, or more frequently if you are over 36 years old. Skin check. Lung cancer screening. You may have this screening every year starting at age 65 if you have a 30-pack-year history of smoking and currently smoke or have quit within the past 15 years. Fecal occult blood test (FOBT) of the stool. You may have this test every year starting at age 17. Flexible sigmoidoscopy or colonoscopy. You may have a sigmoidoscopy every 5 years or a colonoscopy every 10 years starting at age 13. Prostate cancer screening. Recommendations will vary depending on your family history and other risks. Hepatitis C blood test. Hepatitis B blood test. Sexually transmitted disease (STD) testing. Diabetes screening. This is done by checking your blood sugar (glucose) after you have not eaten for a while (fasting). You may have this done every 1-3 years. Abdominal aortic aneurysm (AAA) screening. You may need this if you are a current or former smoker. Osteoporosis. You may be screened starting at age 52 if you are at high risk. Talk with your health care provider about your test results, treatment options, and if necessary, the need for more tests. Vaccines  Your health care provider may recommend certain vaccines, such as: Influenza vaccine. This is recommended every year. Tetanus, diphtheria, and acellular pertussis (Tdap, Td) vaccine. You may need a Td booster every 10 years. Zoster vaccine. You may need this after age 58. Pneumococcal 13-valent conjugate (PCV13) vaccine. One dose is recommended after age 34. Pneumococcal polysaccharide (PPSV23) vaccine. One dose is recommended after age 7. Talk to your health care provider about which screenings and vaccines you need  and how often you need them. This information is not intended to  replace advice given to you by your health care provider. Make sure you discuss any questions you have with your health care provider. Document Released: 06/03/2015 Document Revised: 01/25/2016 Document Reviewed: 03/08/2015 Elsevier Interactive Patient Education  2017 [place] Prevention in the Home Falls can cause injuries. They can happen to people of all ages. There are many things you can do to make your home safe and to help prevent falls. What can I do on the outside of my home? Regularly fix the edges of walkways and driveways and fix any cracks. Remove anything that might make you trip as you walk through a door, such as a raised step or threshold. Trim any bushes or trees on the path to your home. Use bright outdoor lighting. Clear any walking paths of anything that might make someone trip, such as rocks or tools. Regularly check to see if handrails are loose or broken. Make sure that both sides of any steps have handrails. Any raised decks and porches should have guardrails on the edges. Have any leaves, snow, or ice cleared regularly. Use sand or salt on walking paths during winter. Clean up any spills in your garage right away. This includes oil or grease spills. What can I do in the bathroom? Use night lights. Install grab bars by the toilet and in the tub and shower. Do not use towel bars as grab bars. Use non-skid mats or decals in the tub or shower. If you need to sit down in the shower, use a plastic, non-slip stool. Keep the floor dry. Clean up any water that spills on the floor as soon as it happens. Remove soap buildup in the tub or shower regularly. Attach bath mats securely with double-sided non-slip rug tape. Do not have throw rugs and other things on the floor that can make you trip. What can I do in the bedroom? Use night lights. Make sure that you have a light by your bed that is easy to reach. Do not use any sheets or blankets that are too big for  your bed. They should not hang down onto the floor. Have a firm chair that has side arms. You can use this for support while you get dressed. Do not have throw rugs and other things on the floor that can make you trip. What can I do in the kitchen? Clean up any spills right away. Avoid walking on wet floors. Keep items that you use a lot in easy-to-reach places. If you need to reach something above you, use a strong step stool that has a grab bar. Keep electrical cords out of the way. Do not use floor polish or wax that makes floors slippery. If you must use wax, use non-skid floor wax. Do not have throw rugs and other things on the floor that can make you trip. What can I do with my stairs? Do not leave any items on the stairs. Make sure that there are handrails on both sides of the stairs and use them. Fix handrails that are broken or loose. Make sure that handrails are as long as the stairways. Check any carpeting to make sure that it is firmly attached to the stairs. Fix any carpet that is loose or worn. Avoid having throw rugs at the top or bottom of the stairs. If you do have throw rugs, attach them to the floor with carpet tape. Make sure that you  have a light switch at the top of the stairs and the bottom of the stairs. If you do not have them, ask someone to add them for you. What else can I do to help prevent falls? Wear shoes that: Do not have high heels. Have rubber bottoms. Are comfortable and fit you well. Are closed at the toe. Do not wear sandals. If you use a stepladder: Make sure that it is fully opened. Do not climb a closed stepladder. Make sure that both sides of the stepladder are locked into place. Ask someone to hold it for you, if possible. Clearly mark and make sure that you can see: Any grab bars or handrails. First and last steps. Where the edge of each step is. Use tools that help you move around (mobility aids) if they are needed. These  include: Canes. Walkers. Scooters. Crutches. Turn on the lights when you go into a dark area. Replace any light bulbs as soon as they burn out. Set up your furniture so you have a clear path. Avoid moving your furniture around. If any of your floors are uneven, fix them. If there are any pets around you, be aware of where they are. Review your medicines with your doctor. Some medicines can make you feel dizzy. This can increase your chance of falling. Ask your doctor what other things that you can do to help prevent falls. This information is not intended to replace advice given to you by your health care provider. Make sure you discuss any questions you have with your health care provider. Document Released: 03/03/2009 Document Revised: 10/13/2015 Document Reviewed: 06/11/2014 Elsevier Interactive Patient Education  2017 [employer].

## 2021-05-31 ENCOUNTER — Other Ambulatory Visit: Payer: Self-pay | Admitting: Internal Medicine

## 2021-06-27 ENCOUNTER — Other Ambulatory Visit: Payer: Self-pay

## 2021-06-27 ENCOUNTER — Encounter: Payer: Self-pay | Admitting: Internal Medicine

## 2021-06-27 ENCOUNTER — Ambulatory Visit (INDEPENDENT_AMBULATORY_CARE_PROVIDER_SITE_OTHER): Payer: Medicare HMO | Admitting: Internal Medicine

## 2021-06-27 VITALS — BP 126/80 | HR 52 | Temp 98.1°F | Ht 73.0 in | Wt 226.0 lb

## 2021-06-27 DIAGNOSIS — M542 Cervicalgia: Secondary | ICD-10-CM

## 2021-06-27 DIAGNOSIS — G8929 Other chronic pain: Secondary | ICD-10-CM | POA: Diagnosis not present

## 2021-06-27 DIAGNOSIS — I10 Essential (primary) hypertension: Secondary | ICD-10-CM | POA: Diagnosis not present

## 2021-06-27 DIAGNOSIS — M5440 Lumbago with sciatica, unspecified side: Secondary | ICD-10-CM | POA: Diagnosis not present

## 2021-06-27 DIAGNOSIS — E538 Deficiency of other specified B group vitamins: Secondary | ICD-10-CM

## 2021-06-27 DIAGNOSIS — Z0000 Encounter for general adult medical examination without abnormal findings: Secondary | ICD-10-CM
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
## 2021-06-27 NOTE — Assessment & Plan Note (Signed)
Re-start  B12 vit

## 2021-06-27 NOTE — Patient Instructions (Addendum)
Re-start  B12 vitamin  Use rice sock heating pad
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
## 2021-06-27 NOTE — Assessment & Plan Note (Signed)
continue with Coreg, Benicar and Amlodipine 
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
## 2021-07-20 ENCOUNTER — Telehealth: Payer: Self-pay

## 2021-07-20 NOTE — Telephone Encounter (Signed)
Pt is requesting a refill on: ?amLODipine-olmesartan (AZOR) 10-40 MG tablet ?carvedilol (COREG) 25 MG tablet ? ?Pharmacy: ? [facility], [place] ? ?LOV 06/27/21 ?

## 2021-07-21 ENCOUNTER — Other Ambulatory Visit: Payer: Self-pay

## 2021-07-21 MED ORDER — CARVEDILOL 25 MG PO TABS: ORAL_TABLET | ORAL | 2 refills | Status: DC

## 2021-07-21 MED ORDER — AMLODIPINE-OLMESARTAN 10-40 MG PO TABS: 1.0000 | ORAL_TABLET | Freq: Every day | ORAL | 1 refills | Status: DC

## 2021-07-21 NOTE — Telephone Encounter (Signed)
Rx sent 

## 2021-09-11 ENCOUNTER — Other Ambulatory Visit (INDEPENDENT_AMBULATORY_CARE_PROVIDER_SITE_OTHER): Payer: Medicare HMO

## 2021-09-11 DIAGNOSIS — Z0000 Encounter for general adult medical examination without abnormal findings: Secondary | ICD-10-CM | POA: Diagnosis not present

## 2021-09-11 LAB — COMPREHENSIVE METABOLIC PANEL
ALT: 16 U/L (ref 0–53)
AST: 17 U/L (ref 0–37)
Albumin: 4.4 g/dL (ref 3.5–5.2)
Alkaline Phosphatase: 102 U/L (ref 39–117)
BUN: 9 mg/dL (ref 6–23)
CO2: 27 mEq/L (ref 19–32)
Calcium: 9.7 mg/dL (ref 8.4–10.5)
Chloride: 105 mEq/L (ref 96–112)
Creatinine, Ser: 0.8 mg/dL (ref 0.40–1.50)
GFR: 87.27 mL/min (ref >60.00)
Glucose, Bld: 97 mg/dL (ref 70–99)
Potassium: 4.2 mEq/L (ref 3.5–5.1)
Sodium: 139 mEq/L (ref 135–145)
Total Bilirubin: 0.7 mg/dL (ref 0.2–1.2)
Total Protein: 7.3 g/dL (ref 6.0–8.3)

## 2021-09-11 LAB — CBC WITH DIFFERENTIAL/PLATELET
Basophils Absolute: 0 10*3/uL (ref 0.0–0.1)
Basophils Relative: 0.4 % (ref 0.0–3.0)
Eosinophils Absolute: 0.1 10*3/uL (ref 0.0–0.7)
Eosinophils Relative: 1.8 % (ref 0.0–5.0)
HCT: 44.7 % (ref 39.0–52.0)
Hemoglobin: 14.9 g/dL (ref 13.0–17.0)
Lymphocytes Relative: 33.4 % (ref 12.0–46.0)
Lymphs Abs: 2.4 10*3/uL (ref 0.7–4.0)
MCHC: 33.5 g/dL (ref 30.0–36.0)
MCV: 88.3 fl (ref 78.0–100.0)
Monocytes Absolute: 0.8 10*3/uL (ref 0.1–1.0)
Monocytes Relative: 11 % (ref 3.0–12.0)
Neutro Abs: 3.9 10*3/uL (ref 1.4–7.7)
Neutrophils Relative %: 53.4 % (ref 43.0–77.0)
Platelets: 171 10*3/uL (ref 150.0–400.0)
RBC: 5.06 Mil/uL (ref 4.22–5.81)
RDW: 13.9 % (ref 11.5–15.5)
WBC: 7.3 10*3/uL (ref 4.0–10.5)

## 2021-09-11 LAB — URINALYSIS
Bilirubin Urine: NEGATIVE
Hgb urine dipstick: NEGATIVE
Ketones, ur: NEGATIVE
Leukocytes,Ua: NEGATIVE
Nitrite: NEGATIVE
Specific Gravity, Urine: 1.01 (ref 1.000–1.030)
Total Protein, Urine: NEGATIVE
Urine Glucose: NEGATIVE
Urobilinogen, UA: 0.2 (ref 0.0–1.0)
pH: 6 (ref 5.0–8.0)

## 2021-09-11 LAB — LIPID PANEL
Cholesterol: 167 mg/dL (ref 0–200)
HDL: 36.3 mg/dL — ABNORMAL LOW (ref >39.00)
LDL Cholesterol: 106 mg/dL — ABNORMAL HIGH (ref 0–99)
NonHDL: 130.48
Total CHOL/HDL Ratio: 5
Triglycerides: 122 mg/dL (ref 0.0–149.0)
VLDL: 24.4 mg/dL (ref 0.0–40.0)

## 2021-09-11 LAB — PSA: PSA: 0.9 ng/mL (ref 0.10–4.00)

## 2021-09-11 LAB — TSH: TSH: 3.37 u[IU]/mL (ref 0.35–5.50)

## 2021-09-26 ENCOUNTER — Ambulatory Visit (INDEPENDENT_AMBULATORY_CARE_PROVIDER_SITE_OTHER): Payer: Medicare HMO | Admitting: Internal Medicine

## 2021-09-26 ENCOUNTER — Encounter: Payer: Self-pay | Admitting: Internal Medicine

## 2021-09-26 DIAGNOSIS — M542 Cervicalgia: Secondary | ICD-10-CM

## 2021-09-26 DIAGNOSIS — G8929 Other chronic pain: Secondary | ICD-10-CM | POA: Diagnosis not present

## 2021-09-26 DIAGNOSIS — M5440 Lumbago with sciatica, unspecified side: Secondary | ICD-10-CM | POA: Diagnosis not present

## 2021-09-26 DIAGNOSIS — E559 Vitamin D deficiency, unspecified: Secondary | ICD-10-CM | POA: Diagnosis not present

## 2021-09-26 DIAGNOSIS — E538 Deficiency of other specified B group vitamins: Secondary | ICD-10-CM | POA: Diagnosis not present

## 2021-09-26 NOTE — Assessment & Plan Note (Signed)
On Vit D 

## 2021-09-26 NOTE — Assessment & Plan Note (Signed)
Finished PT ?Blue-Emu cream was recommended to use 2-3 times a day ? ?
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
## 2021-09-26 NOTE — Assessment & Plan Note (Signed)
On B12 
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
## 2021-09-26 NOTE — Assessment & Plan Note (Signed)
Doing fair ?ROM exercise for ROM ?Blue-Emu cream was recommended to use 2-3 times a day ? ?

## 2021-09-26 NOTE — Patient Instructions (Signed)
Blue-Emu cream -- use 2-3 times a day ? ?

## 2021-12-31 ENCOUNTER — Other Ambulatory Visit: Payer: Self-pay | Admitting: Internal Medicine

## 2022-01-03 ENCOUNTER — Ambulatory Visit: Payer: Medicare HMO | Admitting: Internal Medicine

## 2022-01-04 ENCOUNTER — Ambulatory Visit
Admission: EM | Admit: 2022-01-04 | Discharge: 2022-01-04 | Disposition: A | Discharge status: Home / Self Care | Payer: Medicare HMO | Attending: Internal Medicine | Admitting: Internal Medicine

## 2022-01-04 ENCOUNTER — Encounter: Payer: Self-pay | Admitting: Emergency Medicine

## 2022-01-04 ENCOUNTER — Telehealth: Payer: Self-pay

## 2022-01-04 DIAGNOSIS — J069 Acute upper respiratory infection, unspecified: Secondary | ICD-10-CM | POA: Diagnosis not present

## 2022-01-04 DIAGNOSIS — Z20822 Contact with and (suspected) exposure to covid-19: Secondary | ICD-10-CM | POA: Diagnosis not present

## 2022-01-04 LAB — SARS CORONAVIRUS 2 BY RT PCR: SARS Coronavirus 2 by RT PCR: POSITIVE — AB

## 2022-01-04 MED ORDER — ACETAMINOPHEN 325 MG PO TABS
650.0000 mg | ORAL_TABLET | Freq: Once | ORAL | Status: AC
  Administered 2022-01-04: 650 mg via ORAL

## 2022-01-04 MED ORDER — BENZONATATE 100 MG PO CAPS: 100.0000 mg | ORAL_CAPSULE | Freq: Three times a day (TID) | ORAL | 0 refills | Status: DC | PRN

## 2022-01-04 MED ORDER — MOLNUPIRAVIR EUA 200MG CAPSULE: 4.0000 | ORAL_CAPSULE | Freq: Two times a day (BID) | ORAL | 0 refills | Status: AC

## 2022-01-04 MED ORDER — MOLNUPIRAVIR EUA 200MG CAPSULE: 4.0000 | ORAL_CAPSULE | Freq: Two times a day (BID) | ORAL | 0 refills | Status: DC
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
## 2022-01-04 NOTE — ED Triage Notes (Signed)
Pt is present today with c/o cough, nasal congestion, chills, and HA. Pt sx started x2 days ago

## 2022-01-04 NOTE — Discharge Instructions (Signed)
Highly suspicious that you have COVID-19 so you are being treated with the COVID antiviral.  Please monitor fever and treat as appropriate with Tylenol.  A cough medication has also been prescribed for you.

## 2022-01-15 ENCOUNTER — Encounter: Payer: Self-pay | Admitting: Internal Medicine

## 2022-01-15 ENCOUNTER — Ambulatory Visit (INDEPENDENT_AMBULATORY_CARE_PROVIDER_SITE_OTHER): Payer: Medicare HMO | Admitting: Internal Medicine

## 2022-01-15 ENCOUNTER — Ambulatory Visit (INDEPENDENT_AMBULATORY_CARE_PROVIDER_SITE_OTHER): Payer: Medicare HMO

## 2022-01-15 VITALS — BP 112/62 | HR 46 | Temp 98.0°F | Ht 73.0 in | Wt 224.0 lb

## 2022-01-15 DIAGNOSIS — R413 Other amnesia: Secondary | ICD-10-CM | POA: Insufficient documentation

## 2022-01-15 DIAGNOSIS — J4520 Mild intermittent asthma, uncomplicated: Secondary | ICD-10-CM

## 2022-01-15 DIAGNOSIS — E559 Vitamin D deficiency, unspecified: Secondary | ICD-10-CM

## 2022-01-15 DIAGNOSIS — H9193 Unspecified hearing loss, bilateral: Secondary | ICD-10-CM | POA: Diagnosis not present

## 2022-01-15 DIAGNOSIS — R202 Paresthesia of skin: Secondary | ICD-10-CM

## 2022-01-15 DIAGNOSIS — M25561 Pain in right knee: Secondary | ICD-10-CM | POA: Diagnosis not present

## 2022-01-15 DIAGNOSIS — S060XAS Concussion with loss of consciousness status unknown, sequela: Secondary | ICD-10-CM

## 2022-01-15 DIAGNOSIS — G8929 Other chronic pain: Secondary | ICD-10-CM

## 2022-01-15 DIAGNOSIS — H9190 Unspecified hearing loss, unspecified ear: Secondary | ICD-10-CM | POA: Insufficient documentation

## 2022-01-15 DIAGNOSIS — F5101 Primary insomnia: Secondary | ICD-10-CM

## 2022-01-15 DIAGNOSIS — R059 Cough, unspecified: Secondary | ICD-10-CM | POA: Diagnosis not present

## 2022-01-15 DIAGNOSIS — G4700 Insomnia, unspecified: Secondary | ICD-10-CM | POA: Insufficient documentation

## 2022-01-15 LAB — CBC WITH DIFFERENTIAL/PLATELET
Basophils Absolute: 0.1 10*3/uL (ref 0.0–0.1)
Basophils Relative: 0.8 % (ref 0.0–3.0)
Eosinophils Absolute: 0.2 10*3/uL (ref 0.0–0.7)
Eosinophils Relative: 2.9 % (ref 0.0–5.0)
HCT: 43.5 % (ref 39.0–52.0)
Hemoglobin: 14.9 g/dL (ref 13.0–17.0)
Lymphocytes Relative: 30.6 % (ref 12.0–46.0)
Lymphs Abs: 2 10*3/uL (ref 0.7–4.0)
MCHC: 34.2 g/dL (ref 30.0–36.0)
MCV: 87.6 fl (ref 78.0–100.0)
Monocytes Absolute: 1.2 10*3/uL — ABNORMAL HIGH (ref 0.1–1.0)
Monocytes Relative: 18.5 % — ABNORMAL HIGH (ref 3.0–12.0)
Neutro Abs: 3.1 10*3/uL (ref 1.4–7.7)
Neutrophils Relative %: 47.2 % (ref 43.0–77.0)
Platelets: 208 10*3/uL (ref 150.0–400.0)
RBC: 4.96 Mil/uL (ref 4.22–5.81)
RDW: 13.8 % (ref 11.5–15.5)
WBC: 6.6 10*3/uL (ref 4.0–10.5)

## 2022-01-15 LAB — COMPREHENSIVE METABOLIC PANEL
ALT: 15 U/L (ref 0–53)
AST: 16 U/L (ref 0–37)
Albumin: 4.5 g/dL (ref 3.5–5.2)
Alkaline Phosphatase: 102 U/L (ref 39–117)
BUN: 13 mg/dL (ref 6–23)
CO2: 25 mEq/L (ref 19–32)
Calcium: 10.1 mg/dL (ref 8.4–10.5)
Chloride: 105 mEq/L (ref 96–112)
Creatinine, Ser: 0.97 mg/dL (ref 0.40–1.50)
GFR: 76.79 mL/min (ref >60.00)
Glucose, Bld: 96 mg/dL (ref 70–99)
Potassium: 4.2 mEq/L (ref 3.5–5.1)
Sodium: 143 mEq/L (ref 135–145)
Total Bilirubin: 0.6 mg/dL (ref 0.2–1.2)
Total Protein: 7.7 g/dL (ref 6.0–8.3)

## 2022-01-15 LAB — TSH: TSH: 3.84 u[IU]/mL (ref 0.35–5.50)

## 2022-01-15 LAB — VITAMIN B12: Vitamin B-12: 557 pg/mL (ref 211–911)

## 2022-01-15 MED ORDER — CEFDINIR 300 MG PO CAPS: 300.0000 mg | ORAL_CAPSULE | Freq: Two times a day (BID) | ORAL | 0 refills | Status: DC

## 2022-01-15 NOTE — Assessment & Plan Note (Signed)
Blue-Emu cream -- use 2-3 times a day ? ?

## 2022-01-15 NOTE — Progress Notes (Signed)
Subjective:  Patient ID: [name] [name], male    DOB: 1945/07/15  Age: 76 y.o. MRN: [id number]  CC: Dementia (Family c/o of him forgetting alot)   HPI RUSHT[place] EARLY presents for memory problems, hearing loss x weeks. C/o COVID 2 weeks ago, treated. Pt took Rx on 8/17. C/o insomnia. Here with his w/gilfriend Gwen  Outpatient Medications Prior to Visit  Medication Sig Dispense Refill   amLODipine-olmesartan (AZOR) 10-40 MG tablet TAKE 1 TABLET EVERY DAY 90 tablet 1   B Complex Vitamins (VITAMIN B COMPLEX) TABS Take 1 tablet by mouth daily.     carvedilol (COREG) 25 MG tablet 1TAKE 1 TABLET (25 MG TOTAL) BY MOUTH 2 (TWO) TIMES DAILY WITH A MEAL. 180 tablet 2   Cholecalciferol (VITAMIN D3) 2000 units capsule Take 1 capsule (2,000 Units total) by mouth daily. 100 capsule 3   fluticasone (FLONASE) 50 MCG/ACT nasal spray PLACE 2 SPRAYS INTO BOTH NOSTRILS DAILY. 48 g 1   Multiple Vitamins-Minerals (SENIOR MULTIVITAMIN PLUS) TABS Take 1 tablet by mouth daily.     tadalafil (CIALIS) 5 MG tablet Take 1 tablet (5 mg total) by mouth daily. 30 tablet 11   benzonatate (TESSALON) 100 MG capsule Take 1 capsule (100 mg total) by mouth 3 (three) times daily as needed for cough. (Patient not taking: Reported on 01/15/2022) 21 capsule 0   levocetirizine (XYZAL) 5 MG tablet TAKE 1 TABLET BY MOUTH EVERY DAY IN THE EVENING (Patient not taking: Reported on 05/08/2021) 90 tablet 1   Lidocaine (HM LIDOCAINE PATCH) 4 % PTCH Apply 1 patch topically every 12 (twelve) hours as needed. (Patient not taking: Reported on 06/27/2021) 30 patch 0   meloxicam (MOBIC) 15 MG tablet TAKE 1 TABLET (15 MG TOTAL) BY MOUTH DAILY AS NEEDED FOR PAIN. (Patient not taking: Reported on 05/08/2021) 90 tablet 1   diclofenac Sodium (VOLTAREN) 1 % GEL Apply 4 g topically 4 (four) times daily. R knee (Patient not taking: Reported on 05/08/2021) 100 g 3   fluticasone furoate-vilanterol (BREO ELLIPTA) 100-25 MCG/INH AEPB Inhale 1 puff into the lungs  daily. (Patient not taking: Reported on 05/08/2021) 1 each 5   FLUZONE HIGH-DOSE QUADRIVALENT 0.7 ML SUSY      PFIZER COVID-19 VAC BIVALENT injection  (Patient not taking: Reported on 01/15/2022)     No facility-administered medications prior to visit.    ROS: Review of Systems  Constitutional:  Negative for appetite change, fatigue and unexpected weight change.  HENT:  Negative for congestion, nosebleeds, sneezing, sore throat and trouble swallowing.   Eyes:  Negative for itching and visual disturbance.  Respiratory:  Negative for cough.   Cardiovascular:  Negative for chest pain, palpitations and leg swelling.  Gastrointestinal:  Negative for abdominal distention, blood in stool, diarrhea and nausea.  Genitourinary:  Negative for frequency and hematuria.  Musculoskeletal:  Negative for back pain, gait problem, joint swelling and neck pain.  Skin:  Negative for rash.  Neurological:  Negative for dizziness, tremors, speech difficulty and weakness.  Psychiatric/Behavioral:  Positive for decreased concentration. Negative for agitation, dysphoric mood and sleep disturbance. The patient is not nervous/anxious.     Objective:  BP 112/62 (BP Location: Left Arm)   Pulse (!) 46   Temp 98 F (36.7 C) (Oral)   Ht '6\' 1"'$  (1.854 m)   Wt 224 lb (101.6 kg)   SpO2 97%   BMI 29.55 kg/m   BP Readings from Last 3 Encounters:  01/15/22 112/62  01/04/22 135/67  09/26/21 120/80    Wt Readings from Last 3 Encounters:  01/15/22 224 lb (101.6 kg)  09/26/21 217 lb (98.4 kg)  06/27/21 226 lb (102.5 kg)    Physical Exam Constitutional:      General: He is not in acute distress.    Appearance: He is well-developed. He is obese.     Comments: NAD  Eyes:     Conjunctiva/sclera: Conjunctivae normal.     Pupils: Pupils are equal, round, and reactive to light.  Neck:     Thyroid: No thyromegaly.     Vascular: No JVD.  Cardiovascular:     Rate and Rhythm: Normal rate and regular rhythm.      Heart sounds: Normal heart sounds. No murmur heard.    No friction rub. No gallop.  Pulmonary:     Effort: Pulmonary effort is normal. No respiratory distress.     Breath sounds: Normal breath sounds. No wheezing or rales.  Chest:     Chest wall: No tenderness.  Abdominal:     General: Bowel sounds are normal. There is no distension.     Palpations: Abdomen is soft. There is no mass.     Tenderness: There is no abdominal tenderness. There is no guarding or rebound.  Musculoskeletal:        General: No tenderness. Normal range of motion.     Cervical back: Normal range of motion.  Lymphadenopathy:     Cervical: No cervical adenopathy.  Skin:    General: Skin is warm and dry.     Findings: No rash.  Neurological:     Mental Status: He is alert and oriented to person, place, and time.     Cranial Nerves: No cranial nerve deficit.     Motor: No abnormal muscle tone.     Coordination: Coordination normal.     Gait: Gait normal.     Deep Tendon Reflexes: Reflexes are normal and symmetric.  Psychiatric:        Behavior: Behavior normal.        Thought Content: Thought content normal.        Judgment: Judgment normal.   Wax in the R earn - mild A little ataxic Recalls 2.5/3 in 5 min  Lab Results  Component Value Date   WBC 7.3 09/11/2021   HGB 14.9 09/11/2021   HCT 44.7 09/11/2021   PLT 171.0 09/11/2021   GLUCOSE 97 09/11/2021   CHOL 167 09/11/2021   TRIG 122.0 09/11/2021   HDL 36.30 (L) 09/11/2021   LDLDIRECT 134.0 02/08/2012   LDLCALC 106 (H) 09/11/2021   ALT 16 09/11/2021   AST 17 09/11/2021   NA 139 09/11/2021   K 4.2 09/11/2021   CL 105 09/11/2021   CREATININE 0.80 09/11/2021   BUN 9 09/11/2021   CO2 27 09/11/2021   TSH 3.37 09/11/2021   PSA 0.90 09/11/2021   HGBA1C 5.8 09/22/2020    No results found.  Assessment & Plan:   Problem List Items Addressed This Visit     Asthmatic bronchitis    New Start Omnicef po      Relevant Orders   DG Chest 2 View    Concussion    Remote      Insomnia    Pt declined sleeping pills      Relevant Orders   CBC with Differential/Platelet   Comprehensive metabolic panel   Knee pain, right    Blue-Emu cream  use 2-3 times a day  Memory problem    Mild Get head MRI if not better and labs Treat insomnia        Relevant Orders   TSH   CBC with Differential/Platelet   Comprehensive metabolic panel   Vitamin V44   Vitamin D deficiency    On Vit D      Other Visit Diagnoses     Paresthesias    -  Primary   Relevant Orders   TSH   Vitamin B12         Meds ordered this encounter  Medications   cefdinir (OMNICEF) 300 MG capsule    Sig: Take 1 capsule (300 mg total) by mouth 2 (two) times daily.    Dispense:  20 capsule    Refill:  0      Follow-up: Return for a follow-up visit.  Walker Kehr, MD

## 2022-01-15 NOTE — Assessment & Plan Note (Signed)
On Vit D 

## 2022-01-15 NOTE — Assessment & Plan Note (Signed)
Remote  

## 2022-01-15 NOTE — Assessment & Plan Note (Addendum)
Mild Get head MRI if not better and labs Treat insomnia

## 2022-01-15 NOTE — Assessment & Plan Note (Signed)
Check hearing at [facility]

## 2022-01-15 NOTE — Assessment & Plan Note (Addendum)
New Start Omnicef po

## 2022-01-15 NOTE — Assessment & Plan Note (Signed)
Pt declined sleeping pills

## 2022-01-16 ENCOUNTER — Other Ambulatory Visit: Payer: Self-pay | Admitting: Internal Medicine

## 2022-01-16 DIAGNOSIS — R918 Other nonspecific abnormal finding of lung field: Secondary | ICD-10-CM

## 2022-01-25 ENCOUNTER — Ambulatory Visit
Admission: RE | Admit: 2022-01-25 | Discharge: 2022-01-25 | Disposition: A | Discharge status: Home / Self Care | Payer: Medicare HMO | Source: Ambulatory Visit | Attending: Internal Medicine | Admitting: Internal Medicine

## 2022-01-25 DIAGNOSIS — R911 Solitary pulmonary nodule: Secondary | ICD-10-CM | POA: Diagnosis not present

## 2022-01-25 DIAGNOSIS — R918 Other nonspecific abnormal finding of lung field: Secondary | ICD-10-CM

## 2022-01-25 DIAGNOSIS — I700 Atherosclerosis of aorta: Secondary | ICD-10-CM | POA: Diagnosis not present

## 2022-04-02 ENCOUNTER — Ambulatory Visit (INDEPENDENT_AMBULATORY_CARE_PROVIDER_SITE_OTHER): Payer: Medicare HMO | Admitting: Internal Medicine

## 2022-04-02 ENCOUNTER — Encounter: Payer: Self-pay | Admitting: Internal Medicine

## 2022-04-02 VITALS — BP 128/62 | HR 50 | Temp 98.3°F | Ht 73.0 in | Wt 225.0 lb

## 2022-04-02 DIAGNOSIS — I10 Essential (primary) hypertension: Secondary | ICD-10-CM

## 2022-04-02 DIAGNOSIS — M25661 Stiffness of right knee, not elsewhere classified: Secondary | ICD-10-CM | POA: Diagnosis not present

## 2022-04-02 DIAGNOSIS — E785 Hyperlipidemia, unspecified: Secondary | ICD-10-CM

## 2022-04-02 DIAGNOSIS — Z23 Encounter for immunization: Secondary | ICD-10-CM | POA: Diagnosis not present

## 2022-04-02 DIAGNOSIS — R739 Hyperglycemia, unspecified: Secondary | ICD-10-CM | POA: Diagnosis not present

## 2022-04-02 DIAGNOSIS — M5440 Lumbago with sciatica, unspecified side: Secondary | ICD-10-CM | POA: Diagnosis not present

## 2022-04-02 DIAGNOSIS — M25561 Pain in right knee: Secondary | ICD-10-CM

## 2022-04-02 DIAGNOSIS — R972 Elevated prostate specific antigen [PSA]: Secondary | ICD-10-CM

## 2022-04-02 DIAGNOSIS — G8929 Other chronic pain: Secondary | ICD-10-CM | POA: Diagnosis not present

## 2022-04-02 MED ORDER — MELOXICAM 15 MG PO TABS: 15.0000 mg | ORAL_TABLET | Freq: Every day | ORAL | 1 refills | Status: DC | PRN

## 2022-04-02 NOTE — Assessment & Plan Note (Signed)
continue with Coreg, Benicar and Amlodipine

## 2022-04-02 NOTE — Assessment & Plan Note (Signed)
Better  

## 2022-04-02 NOTE — Assessment & Plan Note (Signed)
  On diet  

## 2022-04-02 NOTE — Progress Notes (Signed)
Subjective:  Patient ID: [name] [name], male    DOB: Apr 28, 1946  Age: 76 y.o. MRN: [id number]  CC: Follow-up   HPI MAXMILIAN TROSTEL presents for HTN, knee pain, dyslipidemia f/u  Outpatient Medications Prior to Visit  Medication Sig Dispense Refill   amLODipine-olmesartan (AZOR) 10-40 MG tablet TAKE 1 TABLET EVERY DAY 90 tablet 1   B Complex Vitamins (VITAMIN B COMPLEX) TABS Take 1 tablet by mouth daily.     benzonatate (TESSALON) 100 MG capsule Take 1 capsule (100 mg total) by mouth 3 (three) times daily as needed for cough. 21 capsule 0   carvedilol (COREG) 25 MG tablet 1TAKE 1 TABLET (25 MG TOTAL) BY MOUTH 2 (TWO) TIMES DAILY WITH A MEAL. 180 tablet 2   cefdinir (OMNICEF) 300 MG capsule Take 1 capsule (300 mg total) by mouth 2 (two) times daily. 20 capsule 0   Cholecalciferol (VITAMIN D3) 2000 units capsule Take 1 capsule (2,000 Units total) by mouth daily. 100 capsule 3   fluticasone (FLONASE) 50 MCG/ACT nasal spray PLACE 2 SPRAYS INTO BOTH NOSTRILS DAILY. 48 g 1   levocetirizine (XYZAL) 5 MG tablet TAKE 1 TABLET BY MOUTH EVERY DAY IN THE EVENING 90 tablet 1   Lidocaine (HM LIDOCAINE PATCH) 4 % PTCH Apply 1 patch topically every 12 (twelve) hours as needed. 30 patch 0   meloxicam (MOBIC) 15 MG tablet TAKE 1 TABLET (15 MG TOTAL) BY MOUTH DAILY AS NEEDED FOR PAIN. 90 tablet 1   Multiple Vitamins-Minerals (SENIOR MULTIVITAMIN PLUS) TABS Take 1 tablet by mouth daily.     tadalafil (CIALIS) 5 MG tablet Take 1 tablet (5 mg total) by mouth daily. 30 tablet 11   No facility-administered medications prior to visit.    ROS: Review of Systems  Musculoskeletal:  Positive for arthralgias. Negative for gait problem.    Objective:  BP 128/62 (BP Location: Left Arm, Patient Position: Sitting, Cuff Size: Normal)   Pulse (!) 50   Temp 98.3 F (36.8 C) (Oral)   Ht '6\' 1"'$  (1.854 m)   Wt 225 lb (102.1 kg)   SpO2 95%   BMI 29.69 kg/m   BP Readings from Last 3 Encounters:  04/02/22 128/62   01/15/22 112/62  01/04/22 135/67    Wt Readings from Last 3 Encounters:  04/02/22 225 lb (102.1 kg)  01/15/22 224 lb (101.6 kg)  09/26/21 217 lb (98.4 kg)    Physical Exam Constitutional:      General: He is not in acute distress.    Appearance: He is well-developed. He is obese.     Comments: NAD  Eyes:     Conjunctiva/sclera: Conjunctivae normal.     Pupils: Pupils are equal, round, and reactive to light.  Neck:     Thyroid: No thyromegaly.     Vascular: No JVD.  Cardiovascular:     Rate and Rhythm: Normal rate and regular rhythm.     Heart sounds: Normal heart sounds. No murmur heard.    No friction rub. No gallop.  Pulmonary:     Effort: Pulmonary effort is normal. No respiratory distress.     Breath sounds: Normal breath sounds. No wheezing or rales.  Chest:     Chest wall: No tenderness.  Abdominal:     General: Bowel sounds are normal. There is no distension.     Palpations: Abdomen is soft. There is no mass.     Tenderness: There is no abdominal tenderness. There is no guarding or rebound.  Musculoskeletal:        General: No tenderness. Normal range of motion.     Cervical back: Normal range of motion.  Lymphadenopathy:     Cervical: No cervical adenopathy.  Skin:    General: Skin is warm and dry.     Findings: No rash.  Neurological:     Mental Status: He is alert and oriented to person, place, and time.     Cranial Nerves: No cranial nerve deficit.     Motor: No abnormal muscle tone.     Coordination: Coordination normal.     Gait: Gait normal.     Deep Tendon Reflexes: Reflexes are normal and symmetric.  Psychiatric:        Behavior: Behavior normal.        Thought Content: Thought content normal.        Judgment: Judgment normal.   Post-op knee , warm, swollen  Lab Results  Component Value Date   WBC 6.6 01/15/2022   HGB 14.9 01/15/2022   HCT 43.5 01/15/2022   PLT 208.0 01/15/2022   GLUCOSE 96 01/15/2022   CHOL 167 09/11/2021   TRIG 122.0  09/11/2021   HDL 36.30 (L) 09/11/2021   LDLDIRECT 134.0 02/08/2012   LDLCALC 106 (H) 09/11/2021   ALT 15 01/15/2022   AST 16 01/15/2022   NA 143 01/15/2022   K 4.2 01/15/2022   CL 105 01/15/2022   CREATININE 0.97 01/15/2022   BUN 13 01/15/2022   CO2 25 01/15/2022   TSH 3.84 01/15/2022   PSA 0.90 09/11/2021   HGBA1C 5.8 09/22/2020    CT Chest Wo Contrast  Result Date: 01/27/2022 CLINICAL DATA:  History of a pulmonary nodule. EXAM: CT CHEST WITHOUT CONTRAST TECHNIQUE: Multidetector CT imaging of the chest was performed following the standard protocol without IV contrast. RADIATION DOSE REDUCTION: This exam was performed according to the departmental dose-optimization program which includes automated exposure control, adjustment of the mA and/or kV according to patient size and/or use of iterative reconstruction technique. COMPARISON:  Cardiac CT 03/28/2021. FINDINGS: Cardiovascular: No significant vascular findings. Normal heart size. No pericardial effusion. Calcific aortic atherosclerosis noted. Mediastinum/Nodes: No enlarged mediastinal or axillary lymph nodes. Thyroid gland, trachea, and esophagus demonstrate no significant findings. Lungs/Pleura: No pleural effusion. A 0.4 cm nodule in the right middle lobe is unchanged on image 87, series 5. There is some left basilar atelectasis or scar, unchanged. Upper Abdomen: 1.2 cm cyst or hemangioma in the left hepatic lobe on image 137, series 2 is noted. There is 0.4 cm nonobstructing stone in the upper pole the right kidney. Musculoskeletal: A 0.7 cm in diameter sclerotic lesion in T6 is unchanged and likely benign bone island. IMPRESSION: No change in a 0.4 cm right middle lobe pulmonary nodule. No follow-up is indicated. 0.4 cm nonobstructing stone upper pole right kidney. Aortic Atherosclerosis (ICD10-I70.0). Electronically Signed   By: Inge Rise M.D.   On: 01/27/2022 07:31    Assessment & Plan:   Problem List Items Addressed This Visit      Dyslipidemia    On diet      Essential hypertension     continue with Coreg, Benicar and Amlodipine      Relevant Orders   CBC with Differential/Platelet   Comprehensive metabolic panel   Sedimentation rate   C-reactive protein   LOW BACK PAIN    Better      Relevant Medications   meloxicam (MOBIC) 15 MG tablet   Elevated PSA  Monitor PSA      Hyperglycemia    Check A1c      Knee pain, right    S/p TKR - some swelling, heat present Start Meloxicam po      Relevant Orders   CBC with Differential/Platelet   Comprehensive metabolic panel   Sedimentation rate   C-reactive protein   Stiffness of right knee    Will try Meloxicam x 1 mo daily      Other Visit Diagnoses     Flu vaccine need    -  Primary   Relevant Orders   Flu Vaccine QUAD High Dose(Fluad) (Completed)         Meds ordered this encounter  Medications   meloxicam (MOBIC) 15 MG tablet    Sig: Take 1 tablet (15 mg total) by mouth daily as needed for pain.    Dispense:  90 tablet    Refill:  1      Follow-up: Return in about 3 months (around 07/03/2022) for a follow-up visit.  Walker Kehr, MD

## 2022-04-02 NOTE — Assessment & Plan Note (Signed)
Monitor PSA 

## 2022-04-02 NOTE — Assessment & Plan Note (Signed)
Will try Meloxicam x 1 mo daily

## 2022-04-02 NOTE — Assessment & Plan Note (Signed)
Check A1c. 

## 2022-04-02 NOTE — Assessment & Plan Note (Signed)
S/p TKR - some swelling, heat present Start Meloxicam po 

## 2022-04-17 ENCOUNTER — Ambulatory Visit: Payer: Medicare HMO | Admitting: Internal Medicine

## 2022-04-23 DIAGNOSIS — H25813 Combined forms of age-related cataract, bilateral: Secondary | ICD-10-CM | POA: Diagnosis not present

## 2022-04-24 DIAGNOSIS — H524 Presbyopia: Secondary | ICD-10-CM | POA: Diagnosis not present

## 2022-05-09 ENCOUNTER — Ambulatory Visit (INDEPENDENT_AMBULATORY_CARE_PROVIDER_SITE_OTHER): Payer: Medicare HMO

## 2022-05-09 VITALS — Ht 73.0 in | Wt 222.0 lb

## 2022-05-09 DIAGNOSIS — Z0000 Encounter for general adult medical examination without abnormal findings: Secondary | ICD-10-CM | POA: Diagnosis not present

## 2022-05-09 NOTE — Progress Notes (Addendum)
Virtual Visit via Telephone Note  I connected with  [name] [name] on 05/09/22 at 11:15 AM EST by telephone and verified that I am speaking with the correct person using two identifiers.  Location: Patient: Home Provider: [place] Persons participating in the virtual visit: [place]   I discussed the limitations, risks, security and privacy concerns of performing an evaluation and management service by telephone and the availability of in person appointments. The patient expressed understanding and agreed to proceed.  Interactive audio and video telecommunications were attempted between this nurse and patient, however failed, due to patient having technical difficulties OR patient did not have access to video capability.  We continued and completed visit with audio only.  Some vital signs may be absent or patient reported.   Sheral Flow, LPN  Subjective:   [name] [name] is a 76 y.o. male who presents for Medicare Annual/Subsequent preventive examination.  Review of Systems     Cardiac Risk Factors include: advanced age (>37mn, >>10women);dyslipidemia;hypertension;male gender;family history of premature cardiovascular disease     Objective:    Today's Vitals   05/09/22 1116  Weight: 222 lb (100.7 kg)  Height: '6\' 1"'$  (1.854 m)  PainSc: 0-No pain   Body mass index is 29.29 kg/m.     05/09/2022   11:18 AM 05/08/2021    2:00 PM 04/11/2021    5:32 PM 03/03/2020   10:00 AM 12/05/2019    5:49 AM 06/26/2017    8:51 AM 08/28/2016    9:16 AM  Advanced Directives  Does Patient Have a Medical Advance Directive? No Yes No No No No No  Type of ASocial research officer, governmentLiving will       Copy of [place] Chart?  No - copy requested       Would patient like information on creating a medical advance directive? No - Patient declined   No - Patient declined  Yes (ED - Information included in AVS)     Current  Medications (verified) Outpatient Encounter Medications as of 05/09/2022  Medication Sig   amLODipine-olmesartan (AZOR) 10-40 MG tablet TAKE 1 TABLET EVERY DAY   B Complex Vitamins (VITAMIN B COMPLEX) TABS Take 1 tablet by mouth daily.   carvedilol (COREG) 25 MG tablet 1TAKE 1 TABLET (25 MG TOTAL) BY MOUTH 2 (TWO) TIMES DAILY WITH A MEAL.   Cholecalciferol (VITAMIN D3) 2000 units capsule Take 1 capsule (2,000 Units total) by mouth daily.   fluticasone (FLONASE) 50 MCG/ACT nasal spray PLACE 2 SPRAYS INTO BOTH NOSTRILS DAILY.   Lidocaine (HM LIDOCAINE PATCH) 4 % PTCH Apply 1 patch topically every 12 (twelve) hours as needed.   meloxicam (MOBIC) 15 MG tablet TAKE 1 TABLET (15 MG TOTAL) BY MOUTH DAILY AS NEEDED FOR PAIN.   meloxicam (MOBIC) 15 MG tablet Take 1 tablet (15 mg total) by mouth daily as needed for pain.   Multiple Vitamins-Minerals (SENIOR MULTIVITAMIN PLUS) TABS Take 1 tablet by mouth daily.   tadalafil (CIALIS) 5 MG tablet Take 1 tablet (5 mg total) by mouth daily.   [DISCONTINUED] benzonatate (TESSALON) 100 MG capsule Take 1 capsule (100 mg total) by mouth 3 (three) times daily as needed for cough.   [DISCONTINUED] cefdinir (OMNICEF) 300 MG capsule Take 1 capsule (300 mg total) by mouth 2 (two) times daily.   [DISCONTINUED] levocetirizine (XYZAL) 5 MG tablet TAKE 1 TABLET BY MOUTH EVERY DAY IN THE EVENING   [DISCONTINUED] spironolactone (ALDACTONE)  50 MG tablet Take 50 mg by mouth daily. For blood pressure    No facility-administered encounter medications on file as of 05/09/2022.    Allergies (verified) Hydrochlorothiazide   History: Past Medical History:  Diagnosis Date   Allergy    Arthritis    BPH (benign prostatic hyperplasia)    ED (erectile dysfunction)    GERD (gastroesophageal reflux disease)    Heart murmur    hx   Hyperlipidemia    Hypertension    LBP (low back pain)    PONV (postoperative nausea and vomiting)    with epidural inj   Vitamin B 12 deficiency     Past Surgical History:  Procedure Laterality Date   BACK SURGERY     fusion of lower back    COLONOSCOPY     KNEE ARTHROSCOPY  02   Right   POLYPECTOMY     Family History  Problem Relation Age of Onset   Cirrhosis Mother    Alcohol abuse Mother    Cirrhosis Father    Alcohol abuse Father    Heart disease Sister 27       MI   Colon cancer Neg Hx    Colon polyps Neg Hx    Esophageal cancer Neg Hx    Rectal cancer Neg Hx    Stomach cancer Neg Hx    Social History   Socioeconomic History   Marital status: Single    Spouse name: Not on file   Number of children: Not on file   Years of education: Not on file   Highest education level: Not on file  Occupational History   Occupation: DRIVER    Employer: [employer]    Comment: night shift  Tobacco Use   Smoking status: Some Days    Packs/day: 0.25    Years: 41.00    Total pack years: 10.25    Types: Cigars, Cigarettes   Smokeless tobacco: Never   Tobacco comments:    1-2/d  occ smoke   occ drink liquor  Vaping Use   Vaping Use: Never used  Substance and Sexual Activity   Alcohol use: Yes    Comment: Occasional   Drug use: No   Sexual activity: Yes  Other Topics Concern   Not on file  Social History Narrative   Family history of hypertension   Social Determinants of Health   Financial Resource Strain: Low Risk  (05/09/2022)   Overall Financial Resource Strain (CARDIA)    Difficulty of Paying Living Expenses: Not hard at all  Food Insecurity: No Food Insecurity (05/09/2022)   Hunger Vital Sign    Worried About Running Out of Food in the Last Year: Never true    Ran Out of Food in the Last Year: Never true  Transportation Needs: No Transportation Needs (05/09/2022)   PRAPARE - Hydrologist (Medical): No    Lack of Transportation (Non-Medical): No  Physical Activity: Sufficiently Active (05/09/2022)   Exercise Vital Sign    Days of Exercise per Week: 4 days     Minutes of Exercise per Session: 60 min  Stress: No Stress Concern Present (05/09/2022)   [place]    Feeling of Stress : Not at all  Social Connections: Moderately Isolated (05/09/2022)   Social Connection and Isolation Panel [NHANES]    Frequency of Communication with Friends and Family: Twice a week    Frequency of Social Gatherings with  Friends and Family: Twice a week    Attends Religious Services: Never    Marine scientist or Organizations: No    Attends Music therapist: Never    Marital Status: Living with partner    Tobacco Counseling Ready to quit: Not Answered Counseling given: Not Answered Tobacco comments: 1-2/d  occ smoke   occ drink liquor   Clinical Intake:  Pre-visit preparation completed: Yes  Pain : No/denies pain Pain Score: 0-No pain     BMI - recorded: 29.29 Nutritional Status: BMI 25 -29 Overweight Nutritional Risks: None Diabetes: No  How often do you need to have someone help you when you read instructions, pamphlets, or other written materials from your doctor or pharmacy?: 1 - Never What is the last grade level you completed in school?: HSG  Diabetic? no  Interpreter Needed?: No  Information entered by :: Lisette Abu, LPN.   Activities of Daily Living    05/09/2022   11:23 AM  In your present state of health, do you have any difficulty performing the following activities:  Hearing? 0  Vision? 0  Difficulty concentrating or making decisions? 0  Walking or climbing stairs? 0  Dressing or bathing? 0  Doing errands, shopping? 0  Preparing Food and eating ? N  Using the Toilet? N  In the past six months, have you accidently leaked urine? N  Do you have problems with loss of bowel control? N  Managing your Medications? N  Managing your Finances? N  Housekeeping or managing your Housekeeping? N    Patient Care Team: Plotnikov, Evie Lacks, MD  as PCP - General Plotnikov, Evie Lacks, MD Irene Shipper, MD as Consulting Physician (Gastroenterology) [facility], [facility] as Consulting Physician (Ophthalmology) Sydnee Cabal, MD as Consulting Physician (Orthopedic Surgery)  Indicate any recent Medical Services you may have received from other than [facility] providers in the past year (date may be approximate).     Assessment:   This is a routine wellness examination for [facility].  Hearing/Vision screen Hearing Screening - Comments:: Denies hearing difficulties   Vision Screening - Comments:: Wears rx glasses - up to date with routine eye exams with [facility]   Dietary issues and exercise activities discussed: Current Exercise Habits: Structured exercise class, Type of exercise: walking;stretching;treadmill;strength training/weights;exercise ball;calisthenics, Time (Minutes): 60, Frequency (Times/Week): 4, Weekly Exercise (Minutes/Week): 240, Intensity: Moderate, Exercise limited by: None identified   Goals Addressed             This Visit's Progress    To maintain my current health status by continuing to eat healthy, stay physically active and socially active.        Depression Screen    05/09/2022   11:21 AM 01/15/2022    2:16 PM 06/27/2021    9:22 AM 05/08/2021    2:01 PM 05/08/2021    1:59 PM 04/21/2021   10:54 AM 03/03/2020    9:58 AM  PHQ 2/9 Scores  PHQ - 2 Score 0 2 0 0 0 0 0  PHQ- 9 Score  8         Fall Risk    05/09/2022   11:19 AM 01/15/2022    2:15 PM 06/27/2021    9:22 AM 05/08/2021    2:01 PM 04/21/2021   10:55 AM  [place] in the past year? 0 0 0 0 0  Number falls in past yr: 0 0 0 0 0  Injury with Fall? 0  0 0 0 0  Risk for fall due to : No Fall Risks History of fall(s)     Follow up Falls prevention discussed   Falls evaluation completed     [place]:  Any stairs in or around the home? Yes  If so, are there any without handrails? No   Home free of loose throw rugs in walkways, pet beds, electrical cords, etc? Yes  Adequate lighting in your home to reduce risk of falls? Yes   ASSISTIVE DEVICES UTILIZED TO PREVENT FALLS:  Life alert? No  Use of a cane, walker or w/c? No  Grab bars in the bathroom? Yes  Shower chair or bench in shower? Yes  Elevated toilet seat or a handicapped toilet? Yes   TIMED UP AND FT:DDUKG VISIT  Was the test performed? No .   Cognitive Function:        05/09/2022   11:24 AM  6CIT Screen  What Year? 0 points  What month? 0 points  What time? 0 points  Count back from 20 0 points  Months in reverse 0 points  Repeat phrase 0 points  Total Score 0 points    Immunizations Immunization History  Administered Date(s) Administered   Fluad Quad(high Dose 65+) 05/07/2019, 02/18/2020, 04/02/2022   Influenza, High Dose Seasonal PF 03/04/2013, 06/25/2016, 03/01/2017, 03/07/2018   Influenza,inj,Quad PF,6+ Mos 01/13/2014, 06/22/2015   Influenza-Unspecified 01/23/2021   PFIZER(Purple Top)SARS-COV-2 Vaccination 07/05/2019, 07/28/2019, 03/05/2020   Pneumococcal Conjugate-13 09/09/2013   Pneumococcal Polysaccharide-23 09/03/2012   Td 03/30/2009   Td (Adult), 2 Lf Tetanus Toxid, Preservative Free 03/30/2009   Tdap 02/18/2020   Zoster Recombinat (Shingrix) 12/25/2017, 05/16/2018   Zoster, Live 02/14/2010    TDAP status: Up to date  Flu Vaccine status: Up to date  Pneumococcal vaccine status: Up to date  Covid-19 vaccine status: Completed vaccines  Qualifies for Shingles Vaccine? Yes   Zostavax completed Yes   Shingrix Completed?: Yes  Screening Tests Health Maintenance  Topic Date Due   COVID-19 Vaccine (4 - 2023-24 season) 01/19/2022   Medicare Annual Wellness (AWV)  05/10/2023   COLONOSCOPY (Pts 45-81yr Insurance coverage will need to be confirmed)  08/29/2026   DTaP/Tdap/Td (3 - Td or Tdap) 02/17/2030   Pneumonia Vaccine 75 Years old  Completed   INFLUENZA VACCINE   Completed   Hepatitis C Screening  Completed   Zoster Vaccines- Shingrix  Completed   HPV VACCINES  Aged Out    Health Maintenance  Health Maintenance Due  Topic Date Due   COVID-19 Vaccine (4 - 2023-24 season) 01/19/2022    Colorectal cancer screening: Type of screening: Colonoscopy. Completed 08/28/2016. Repeat every 10 years  Lung Cancer Screening: (Low Dose CT Chest recommended if Age 562-80years, 30 pack-year currently smoking OR have quit w/in 15years.) does not qualify.   Lung Cancer Screening Referral: no  Additional Screening:  Hepatitis C Screening: does qualify; Completed 06/23/2015  Vision Screening: Recommended annual ophthalmology exams for early detection of glaucoma and other disorders of the eye. Is the patient up to date with their annual eye exam?  Yes  Who is the provider or what is the name of the office in which the patient attends annual eye exams? [facility] EnergyIf pt is not established with a provider, would they like to be referred to a provider to establish care? No .   Dental Screening: Recommended annual dental exams for proper oral hygiene  Community Resource Referral / Chronic  Care Management: CRR required this visit?  No   CCM required this visit?  No      Plan:     I have personally reviewed and noted the following in the patient's chart:   Medical and social history Use of alcohol, tobacco or illicit drugs  Current medications and supplements including opioid prescriptions. Patient is not currently taking opioid prescriptions. Functional ability and status Nutritional status Physical activity Advanced directives List of other physicians Hospitalizations, surgeries, and ER visits in previous 12 months Vitals Screenings to include cognitive, depression, and falls Referrals and appointments  In addition, I have reviewed and discussed with patient certain preventive protocols, quality metrics, and best practice  recommendations. A written personalized care plan for preventive services as well as general preventive health recommendations were provided to patient.     Sheral Flow, LPN   [id number]   Nurse Notes: N/A  Medical screening examination/treatment/procedure(s) were performed by non-physician practitioner and as supervising physician I was immediately available for consultation/collaboration.  I agree with above. Lew Dawes, MD

## 2022-05-09 NOTE — Patient Instructions (Signed)
[name] [name] , Thank you for taking time to come for your Medicare Wellness Visit. I appreciate your ongoing commitment to your health goals. Please review the following plan we discussed and let me know if I can assist you in the future.   These are the goals we discussed:  Goals      To maintain my current health status by continuing to eat healthy, stay physically active and socially active.        This is a list of the screening recommended for you and due dates:  Health Maintenance  Topic Date Due   COVID-19 Vaccine (4 - 2023-24 season) 01/19/2022   Medicare Annual Wellness Visit  05/10/2023   Colon Cancer Screening  08/29/2026   DTaP/Tdap/Td vaccine (3 - Td or Tdap) 02/17/2030   Pneumonia Vaccine  Completed   Flu Shot  Completed   Hepatitis C Screening: USPSTF Recommendation to screen - Ages 18-79 yo.  Completed   Zoster (Shingles) Vaccine  Completed   HPV Vaccine  Aged Out    Advanced directives: No; Advance directive discussed with you today. Even though you declined this today please call our office should you change your mind and we can give you the proper paperwork for you to fill out.  Conditions/risks identified: Yes  Next appointment: Follow up in one year for your annual wellness visit.   Preventive Care 68 Years and Older, Male  Preventive care refers to lifestyle choices and visits with your health care provider that can promote health and wellness. What does preventive care include? A yearly physical exam. This is also called an annual well check. Dental exams once or twice a year. Routine eye exams. Ask your health care provider how often you should have your eyes checked. Personal lifestyle choices, including: Daily care of your teeth and gums. Regular physical activity. Eating a healthy diet. Avoiding tobacco and drug use. Limiting alcohol use. Practicing safe sex. Taking low doses of aspirin every day. Taking vitamin and mineral supplements as  recommended by your health care provider. What happens during an annual well check? The services and screenings done by your health care provider during your annual well check will depend on your age, overall health, lifestyle risk factors, and family history of disease. Counseling  Your health care provider may ask you questions about your: Alcohol use. Tobacco use. Drug use. Emotional well-being. Home and relationship well-being. Sexual activity. Eating habits. History of falls. Memory and ability to understand (cognition). Work and work Statistician. Screening  You may have the following tests or measurements: Height, weight, and BMI. Blood pressure. Lipid and cholesterol levels. These may be checked every 5 years, or more frequently if you are over 71 years old. Skin check. Lung cancer screening. You may have this screening every year starting at age 70 if you have a 30-pack-year history of smoking and currently smoke or have quit within the past 15 years. Fecal occult blood test (FOBT) of the stool. You may have this test every year starting at age 43. Flexible sigmoidoscopy or colonoscopy. You may have a sigmoidoscopy every 5 years or a colonoscopy every 10 years starting at age 75. Prostate cancer screening. Recommendations will vary depending on your family history and other risks. Hepatitis C blood test. Hepatitis B blood test. Sexually transmitted disease (STD) testing. Diabetes screening. This is done by checking your blood sugar (glucose) after you have not eaten for a while (fasting). You may have this done every 1-3 years. Abdominal aortic  aneurysm (AAA) screening. You may need this if you are a current or former smoker. Osteoporosis. You may be screened starting at age 21 if you are at high risk. Talk with your health care provider about your test results, treatment options, and if necessary, the need for more tests. Vaccines  Your health care provider may recommend  certain vaccines, such as: Influenza vaccine. This is recommended every year. Tetanus, diphtheria, and acellular pertussis (Tdap, Td) vaccine. You may need a Td booster every 10 years. Zoster vaccine. You may need this after age 96. Pneumococcal 13-valent conjugate (PCV13) vaccine. One dose is recommended after age 67. Pneumococcal polysaccharide (PPSV23) vaccine. One dose is recommended after age 75. Talk to your health care provider about which screenings and vaccines you need and how often you need them. This information is not intended to replace advice given to you by your health care provider. Make sure you discuss any questions you have with your health care provider. Document Released: 06/03/2015 Document Revised: 01/25/2016 Document Reviewed: 03/08/2015 Elsevier Interactive Patient Education  2017 [place] Prevention in the Home Falls can cause injuries. They can happen to people of all ages. There are many things you can do to make your home safe and to help prevent falls. What can I do on the outside of my home? Regularly fix the edges of walkways and driveways and fix any cracks. Remove anything that might make you trip as you walk through a door, such as a raised step or threshold. Trim any bushes or trees on the path to your home. Use bright outdoor lighting. Clear any walking paths of anything that might make someone trip, such as rocks or tools. Regularly check to see if handrails are loose or broken. Make sure that both sides of any steps have handrails. Any raised decks and porches should have guardrails on the edges. Have any leaves, snow, or ice cleared regularly. Use sand or salt on walking paths during winter. Clean up any spills in your garage right away. This includes oil or grease spills. What can I do in the bathroom? Use night lights. Install grab bars by the toilet and in the tub and shower. Do not use towel bars as grab bars. Use non-skid mats or  decals in the tub or shower. If you need to sit down in the shower, use a plastic, non-slip stool. Keep the floor dry. Clean up any water that spills on the floor as soon as it happens. Remove soap buildup in the tub or shower regularly. Attach bath mats securely with double-sided non-slip rug tape. Do not have throw rugs and other things on the floor that can make you trip. What can I do in the bedroom? Use night lights. Make sure that you have a light by your bed that is easy to reach. Do not use any sheets or blankets that are too big for your bed. They should not hang down onto the floor. Have a firm chair that has side arms. You can use this for support while you get dressed. Do not have throw rugs and other things on the floor that can make you trip. What can I do in the kitchen? Clean up any spills right away. Avoid walking on wet floors. Keep items that you use a lot in easy-to-reach places. If you need to reach something above you, use a strong step stool that has a grab bar. Keep electrical cords out of the way. Do not use floor  polish or wax that makes floors slippery. If you must use wax, use non-skid floor wax. Do not have throw rugs and other things on the floor that can make you trip. What can I do with my stairs? Do not leave any items on the stairs. Make sure that there are handrails on both sides of the stairs and use them. Fix handrails that are broken or loose. Make sure that handrails are as long as the stairways. Check any carpeting to make sure that it is firmly attached to the stairs. Fix any carpet that is loose or worn. Avoid having throw rugs at the top or bottom of the stairs. If you do have throw rugs, attach them to the floor with carpet tape. Make sure that you have a light switch at the top of the stairs and the bottom of the stairs. If you do not have them, ask someone to add them for you. What else can I do to help prevent falls? Wear shoes that: Do not  have high heels. Have rubber bottoms. Are comfortable and fit you well. Are closed at the toe. Do not wear sandals. If you use a stepladder: Make sure that it is fully opened. Do not climb a closed stepladder. Make sure that both sides of the stepladder are locked into place. Ask someone to hold it for you, if possible. Clearly mark and make sure that you can see: Any grab bars or handrails. First and last steps. Where the edge of each step is. Use tools that help you move around (mobility aids) if they are needed. These include: Canes. Walkers. Scooters. Crutches. Turn on the lights when you go into a dark area. Replace any light bulbs as soon as they burn out. Set up your furniture so you have a clear path. Avoid moving your furniture around. If any of your floors are uneven, fix them. If there are any pets around you, be aware of where they are. Review your medicines with your doctor. Some medicines can make you feel dizzy. This can increase your chance of falling. Ask your doctor what other things that you can do to help prevent falls. This information is not intended to replace advice given to you by your health care provider. Make sure you discuss any questions you have with your health care provider. Document Released: 03/03/2009 Document Revised: 10/13/2015 Document Reviewed: 06/11/2014 Elsevier Interactive Patient Education  2017 [employer].

## 2022-05-29 IMAGING — CR DG CHEST 2V
2 series · 2 of 2 positions shown · non-contrast
Comparison: 05/30/2013

CLINICAL DATA: Fever body aches.

EXAM:
CHEST - 2 VIEW

[w chest pa]
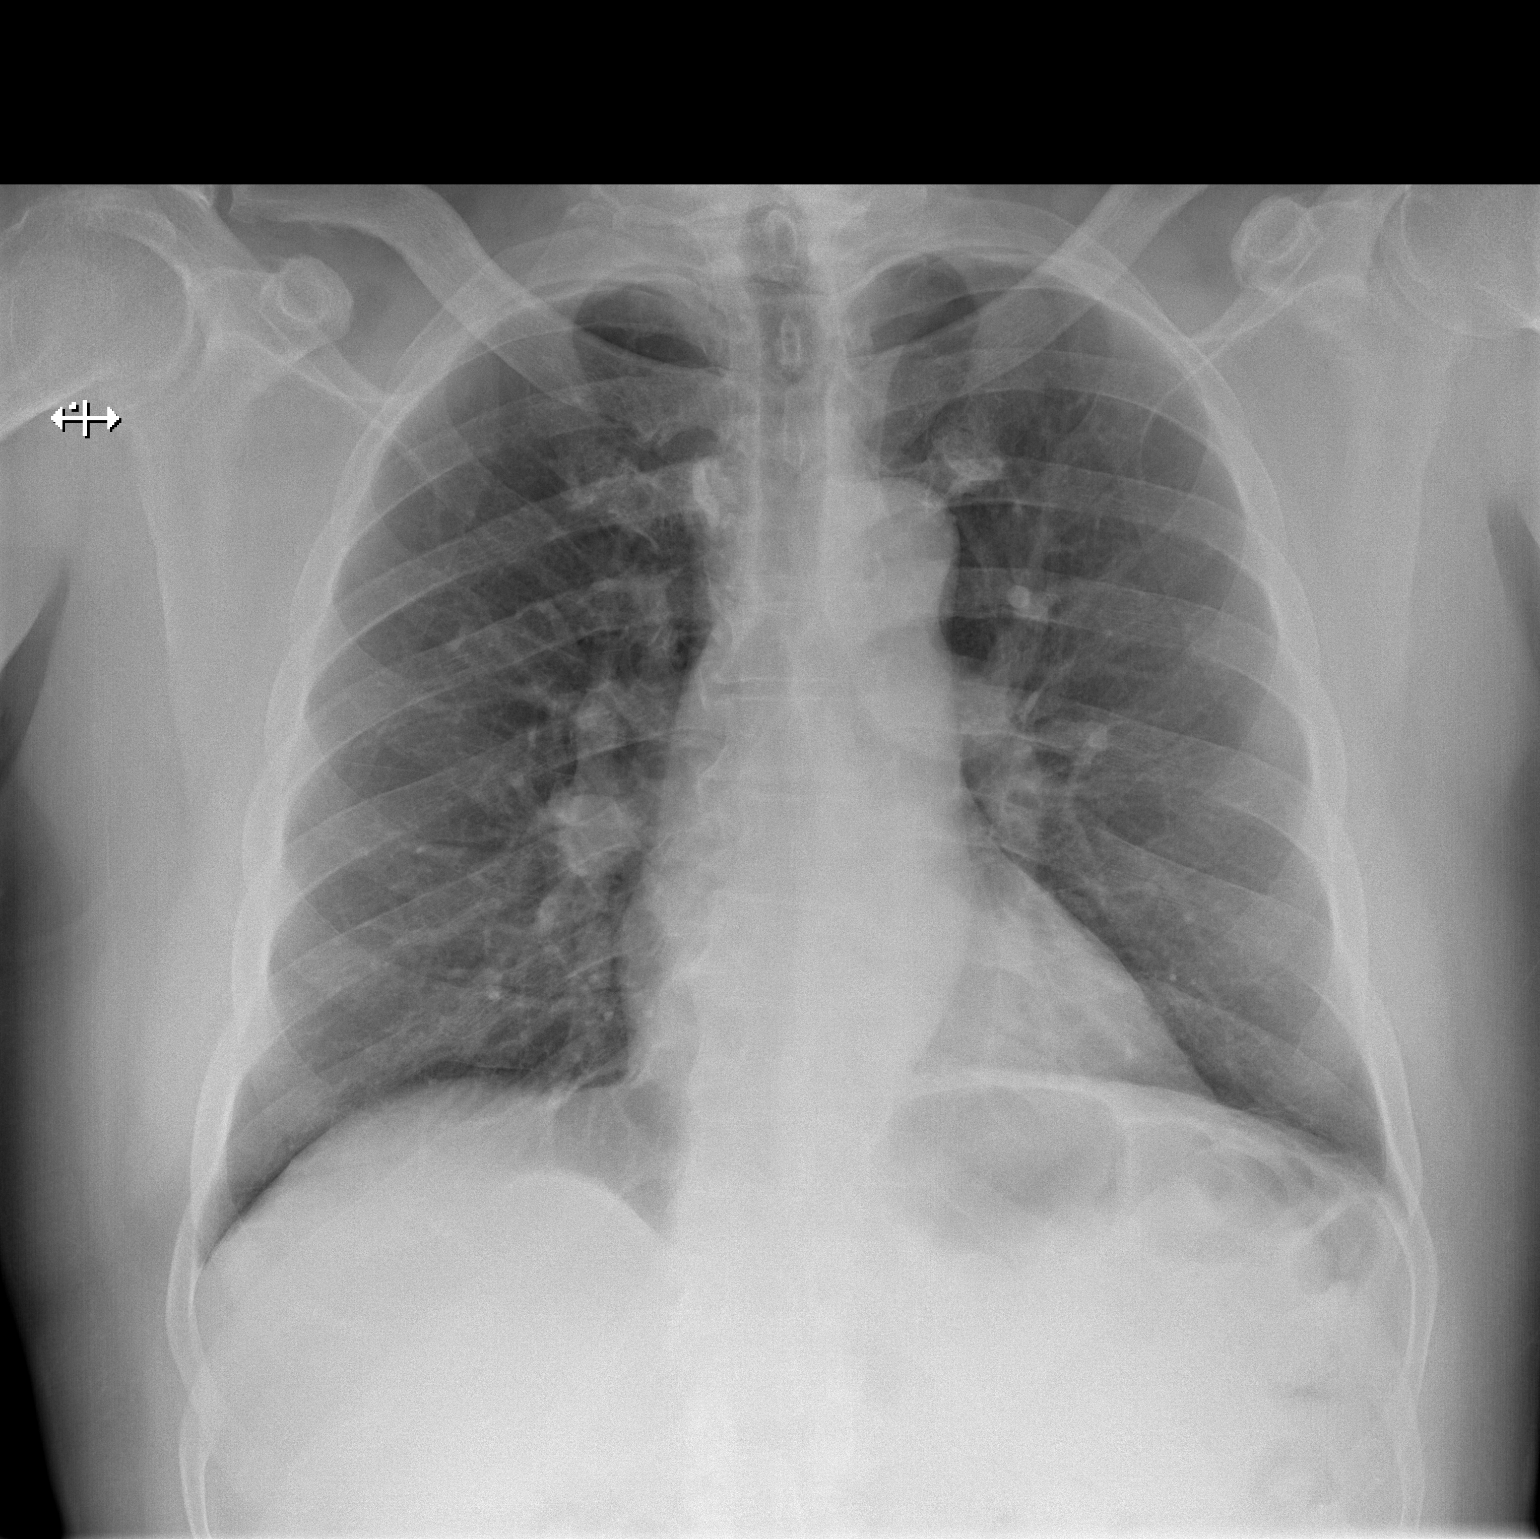

[w chest lat]
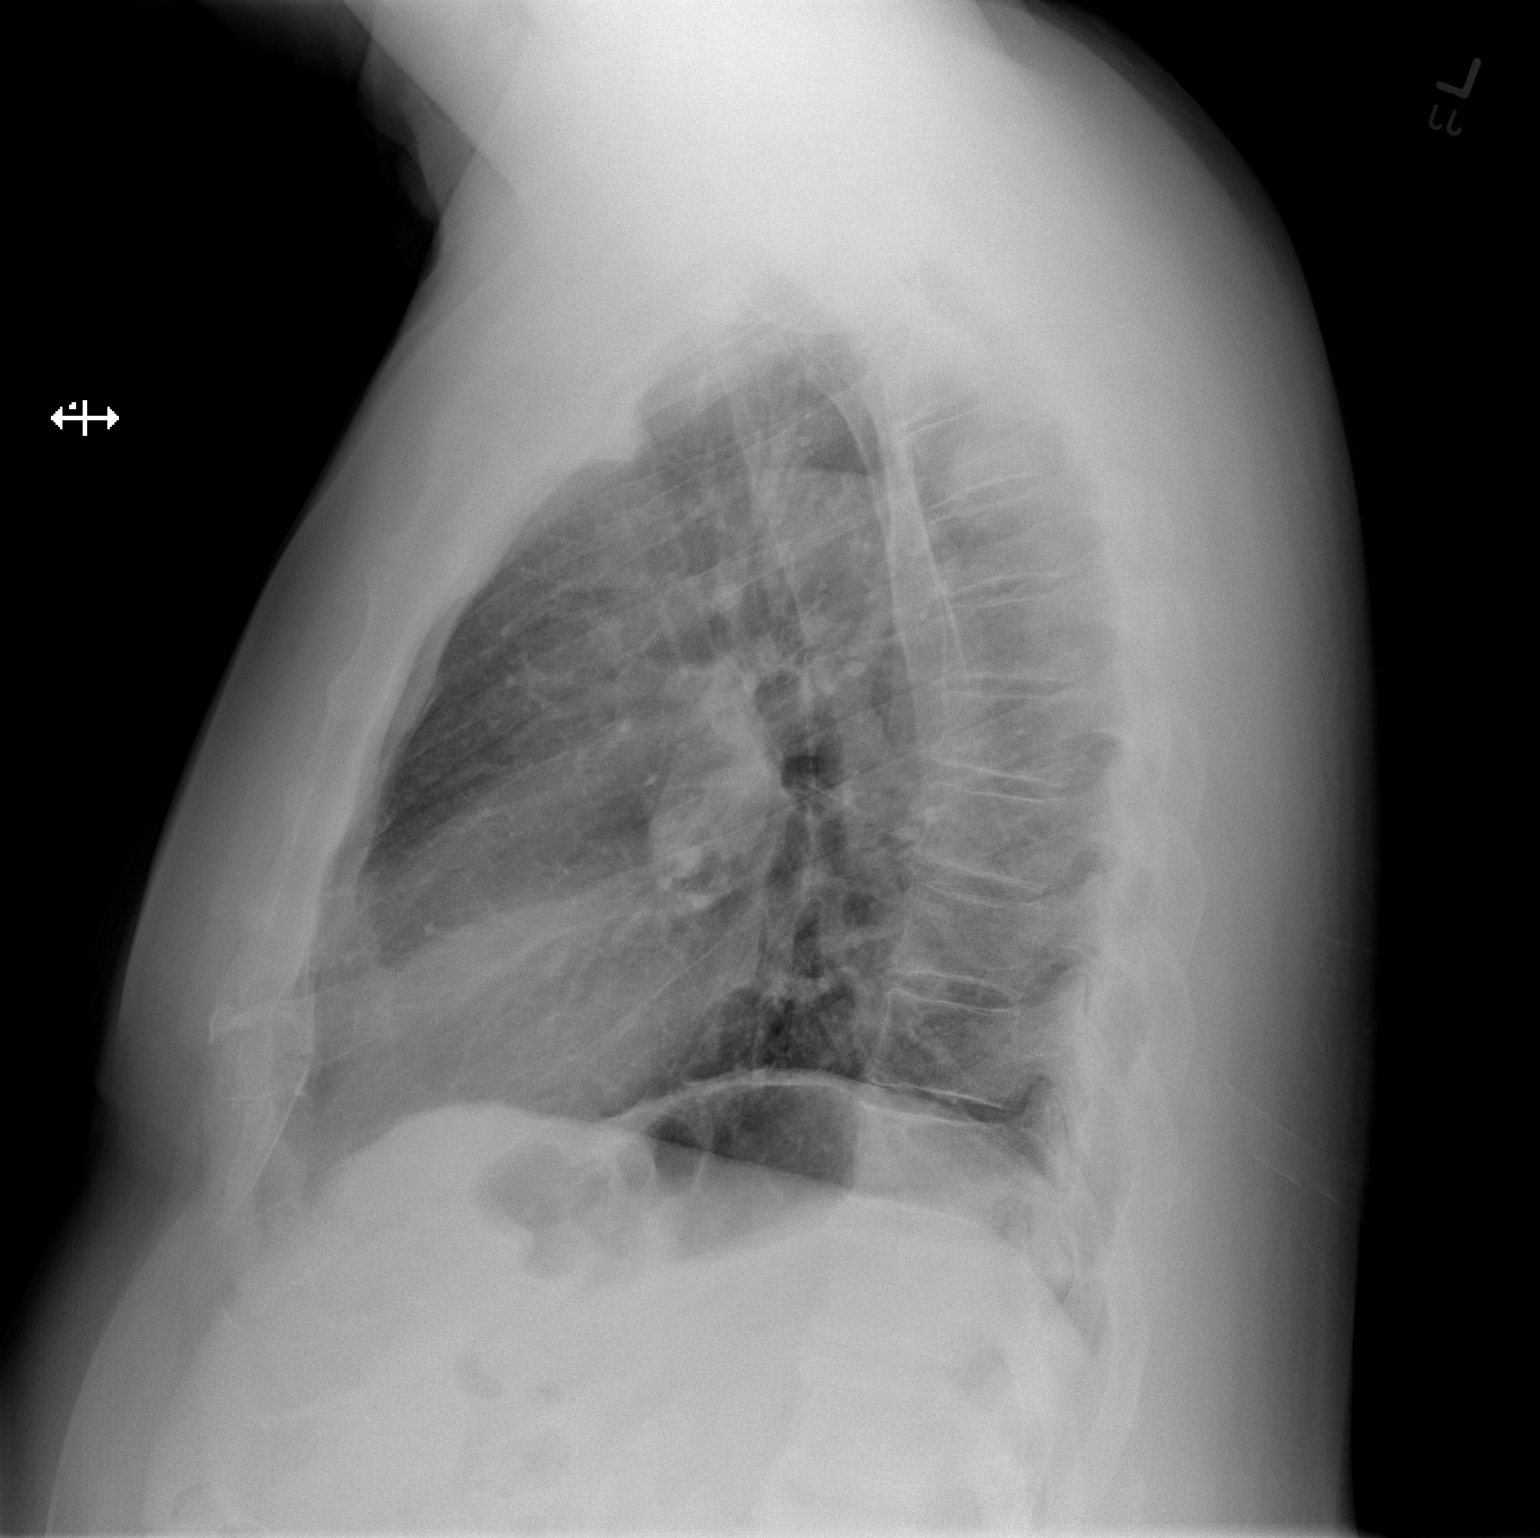

[2 of 2 positions shown; findings below may reference images not displayed]

FINDINGS: The heart size and mediastinal contours are within normal limits.
Both lungs are clear. The visualized skeletal structures are
unremarkable.
IMPRESSION: No active cardiopulmonary disease.

## 2022-06-01 IMAGING — CT CT HEAD W/O CM
3 of 4 series · 15 of 47 positions shown, 18 images · non-contrast
Comparison: No pertinent prior studies available for comparison.

CLINICAL DATA: Non intractable headache, unspecified chronicity
pattern, unspecified headache type. Additional provided: Patient
reports headaches, symptoms for 3 days, unable to sleep.

EXAM:
CT HEAD WITHOUT CONTRAST
TECHNIQUE: Contiguous axial images were obtained from the base of the skull
through the vertex without intravenous contrast.

[Series 2: head 5.00 hr40 s3 axial ibhc · axial · 0.44mm/px · z∈[-626,-486]mm · 9 of 34 slices shown, 12 images]
[im 3/34  brain]
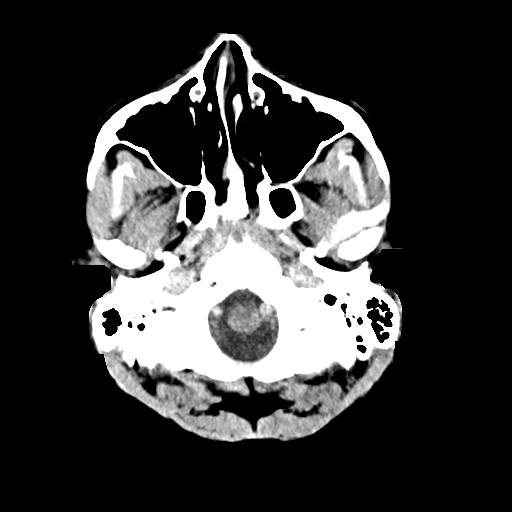
[im 3/34  bone]
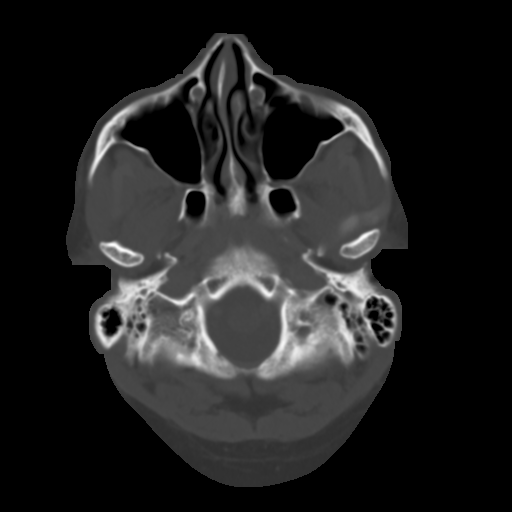
[im 8/34  brain]
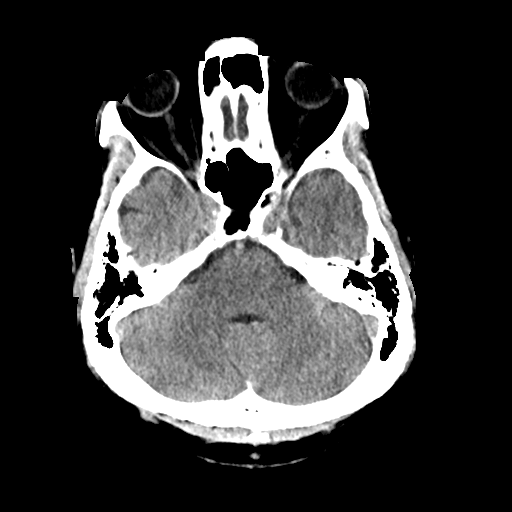
[im 10/34  brain]
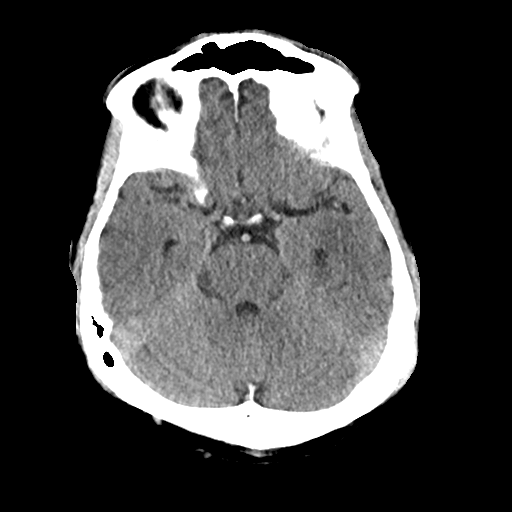
[im 15/34  brain]
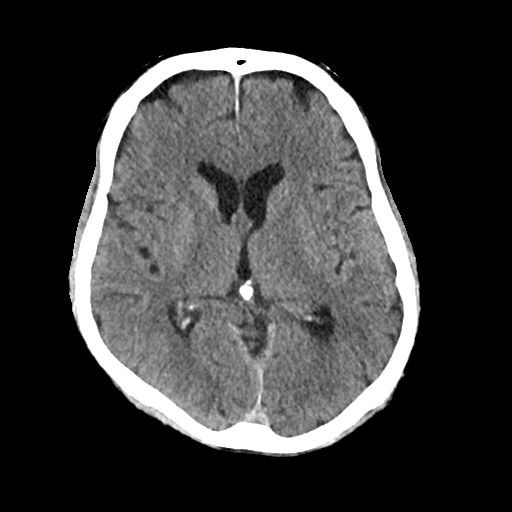
[im 17/34  brain]
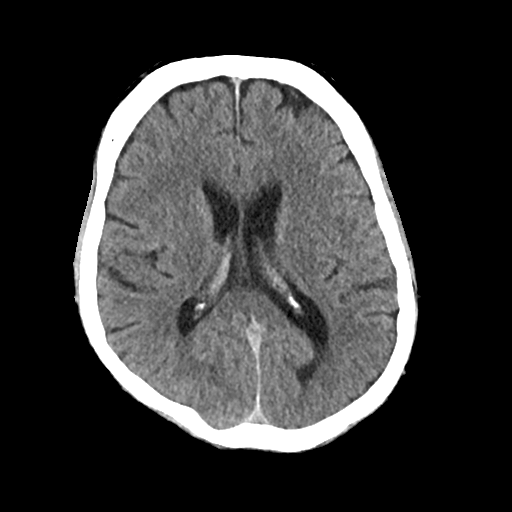
[im 17/34  bone]
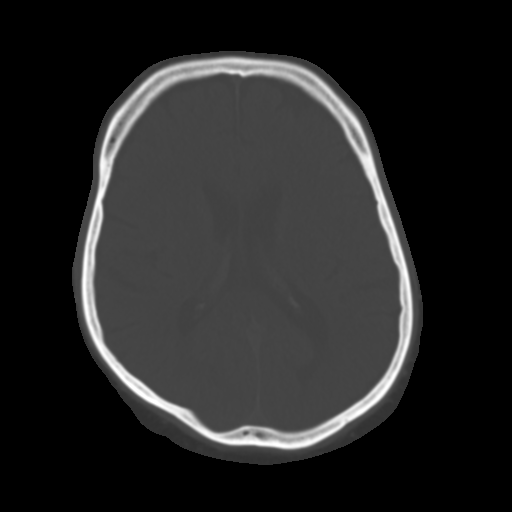
[im 19/34  brain]
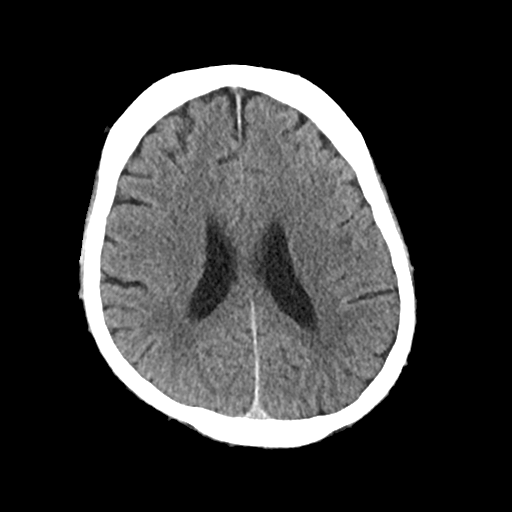
[im 24/34  brain]
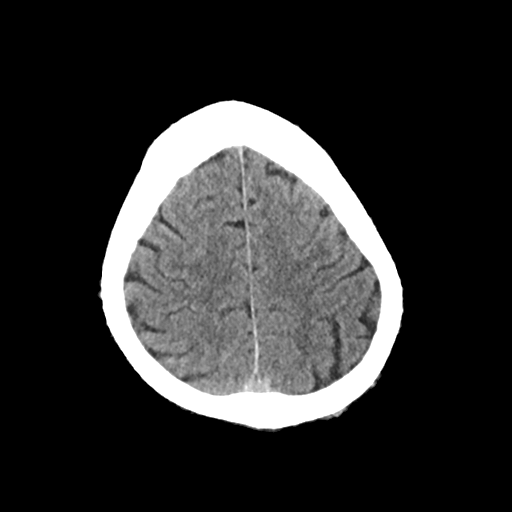
[im 26/34  brain]
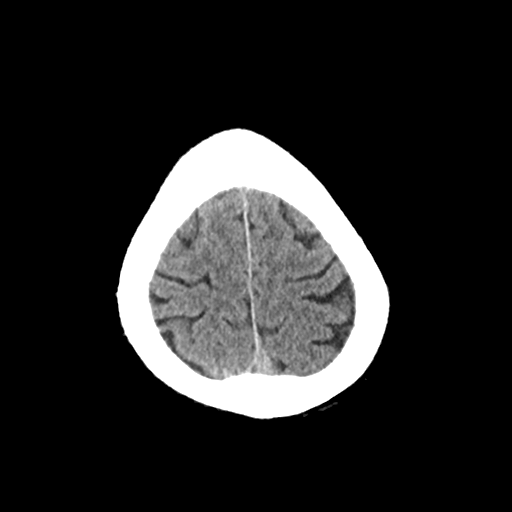
[im 31/34  brain]
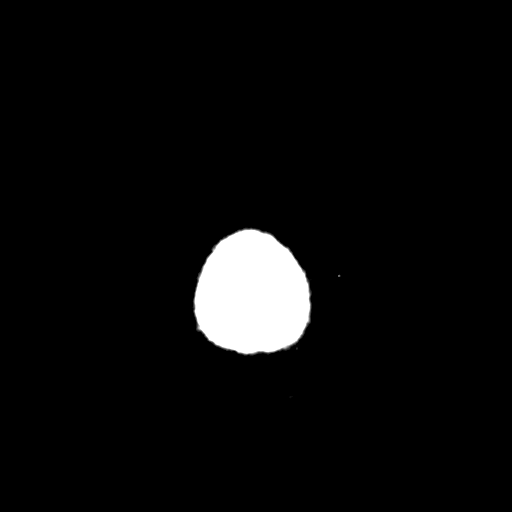
[im 31/34  bone]
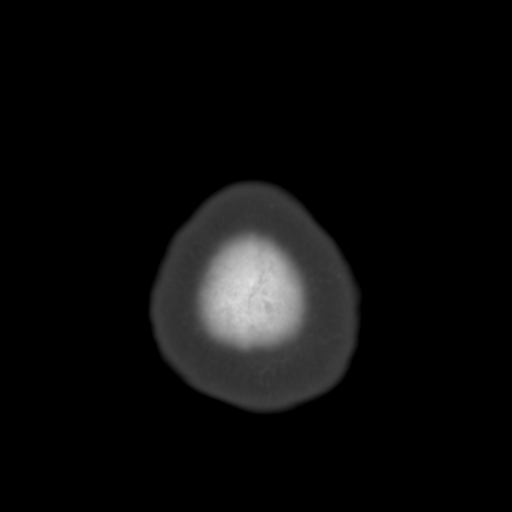

[Series 4: head 3.00 hr40 s3 sag · sagittal · 0.34mm/px · 3 of 74 slices shown]
[im 25/74  brain]
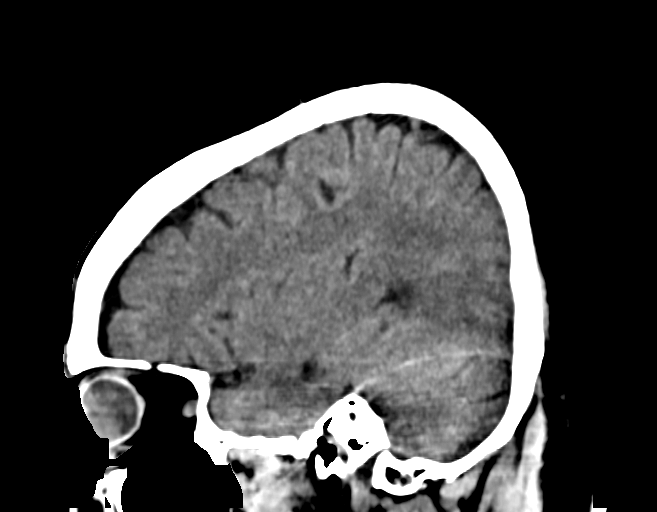
[im 37/74  brain]
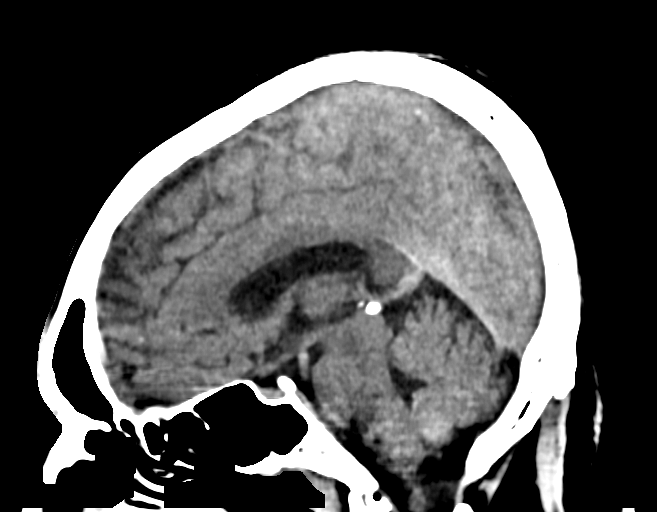
[im 49/74  brain]
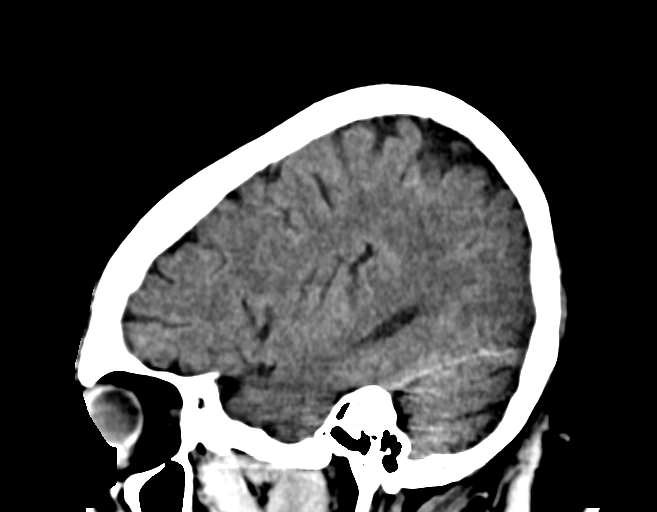

[Series 6: head 3.00 hr40 s3 cor · coronal · 0.34mm/px · 3 of 74 slices shown]
[im 25/74  brain]
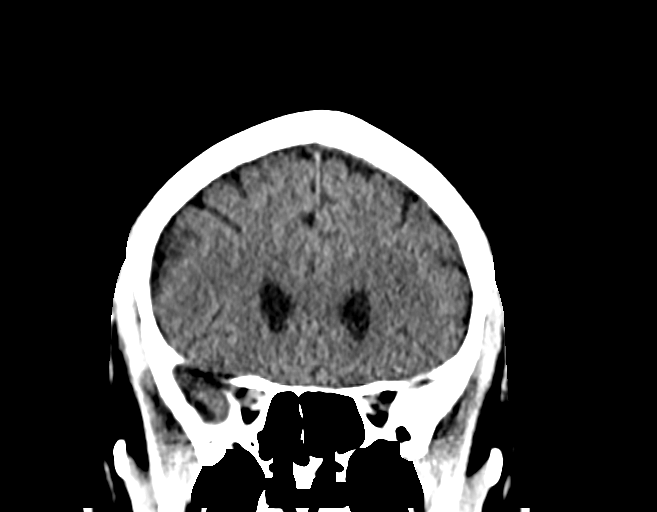
[im 33/74  brain]
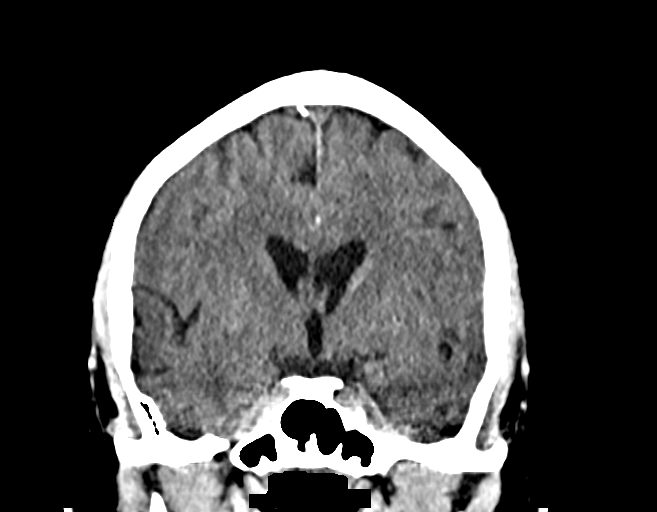
[im 41/74  brain]
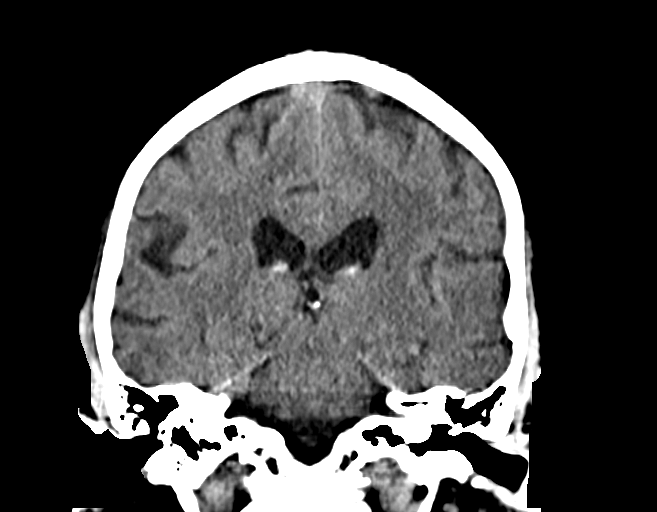

[15 of 47 positions shown; findings below may reference images not displayed]

FINDINGS: Brain:

Cerebral volume is normal for age.

There is no acute intracranial hemorrhage.

No demarcated cortical infarct.

No extra-axial fluid collection.

No evidence of intracranial mass.

No midline shift.

Vascular: No hyperdense vessel.

Skull: Normal. Negative for fracture or focal lesion.

Sinuses/Orbits: Visualized orbits show no acute finding. No
significant paranasal sinus disease or mastoid effusion at the
imaged levels.
IMPRESSION: Unremarkable non-contrast CT appearance of the brain. No evidence of
acute intracranial abnormality.

## 2022-07-04 ENCOUNTER — Encounter: Payer: Self-pay | Admitting: Internal Medicine

## 2022-07-04 ENCOUNTER — Ambulatory Visit (INDEPENDENT_AMBULATORY_CARE_PROVIDER_SITE_OTHER): Payer: Medicare HMO | Admitting: Internal Medicine

## 2022-07-04 VITALS — BP 130/86 | HR 55 | Temp 98.2°F | Ht 73.0 in | Wt 228.0 lb

## 2022-07-04 DIAGNOSIS — E559 Vitamin D deficiency, unspecified: Secondary | ICD-10-CM

## 2022-07-04 DIAGNOSIS — F5101 Primary insomnia: Secondary | ICD-10-CM

## 2022-07-04 DIAGNOSIS — M25561 Pain in right knee: Secondary | ICD-10-CM | POA: Diagnosis not present

## 2022-07-04 DIAGNOSIS — G8929 Other chronic pain: Secondary | ICD-10-CM

## 2022-07-04 DIAGNOSIS — I10 Essential (primary) hypertension: Secondary | ICD-10-CM

## 2022-07-04 LAB — CBC WITH DIFFERENTIAL/PLATELET
Basophils Absolute: 0 10*3/uL (ref 0.0–0.1)
Basophils Relative: 0.5 % (ref 0.0–3.0)
Eosinophils Absolute: 0.2 10*3/uL (ref 0.0–0.7)
Eosinophils Relative: 2.2 % (ref 0.0–5.0)
HCT: 45.5 % (ref 39.0–52.0)
Hemoglobin: 15.6 g/dL (ref 13.0–17.0)
Lymphocytes Relative: 23.8 % (ref 12.0–46.0)
Lymphs Abs: 1.9 10*3/uL (ref 0.7–4.0)
MCHC: 34.2 g/dL (ref 30.0–36.0)
MCV: 88.6 fl (ref 78.0–100.0)
Monocytes Absolute: 1.1 10*3/uL — ABNORMAL HIGH (ref 0.1–1.0)
Monocytes Relative: 13.6 % — ABNORMAL HIGH (ref 3.0–12.0)
Neutro Abs: 4.7 10*3/uL (ref 1.4–7.7)
Neutrophils Relative %: 59.9 % (ref 43.0–77.0)
Platelets: 184 10*3/uL (ref 150.0–400.0)
RBC: 5.13 Mil/uL (ref 4.22–5.81)
RDW: 13.8 % (ref 11.5–15.5)
WBC: 7.8 10*3/uL (ref 4.0–10.5)

## 2022-07-04 LAB — COMPREHENSIVE METABOLIC PANEL
ALT: 16 U/L (ref 0–53)
AST: 17 U/L (ref 0–37)
Albumin: 4.3 g/dL (ref 3.5–5.2)
Alkaline Phosphatase: 89 U/L (ref 39–117)
BUN: 12 mg/dL (ref 6–23)
CO2: 28 mEq/L (ref 19–32)
Calcium: 10.3 mg/dL (ref 8.4–10.5)
Chloride: 106 mEq/L (ref 96–112)
Creatinine, Ser: 1.06 mg/dL (ref 0.40–1.50)
GFR: 68.81 mL/min (ref >60.00)
Glucose, Bld: 105 mg/dL — ABNORMAL HIGH (ref 70–99)
Potassium: 3.8 mEq/L (ref 3.5–5.1)
Sodium: 141 mEq/L (ref 135–145)
Total Bilirubin: 0.9 mg/dL (ref 0.2–1.2)
Total Protein: 7.7 g/dL (ref 6.0–8.3)

## 2022-07-04 LAB — SEDIMENTATION RATE: Sed Rate: 15 mm/hr (ref 0–20)

## 2022-07-04 LAB — C-REACTIVE PROTEIN: CRP: <1 mg/dL (ref 0.5–20.0)

## 2022-07-04 NOTE — Patient Instructions (Signed)
Try Melatonin

## 2022-07-04 NOTE — Assessment & Plan Note (Signed)
On Vit D 

## 2022-07-04 NOTE — Assessment & Plan Note (Signed)
S/p TKR - some swelling, heat present Start Meloxicam po

## 2022-07-04 NOTE — Assessment & Plan Note (Signed)
Continue with Coreg, Benicar and Amlodipine

## 2022-07-04 NOTE — Progress Notes (Signed)
Subjective:  Patient ID: [name] [name], male    DOB: [birth year]  Age: 77 y.o. MRN: [id number]  CC: PPD Reading (3 month f/u)   HPI OTHER SCHNITZER presents for R knee pain, HTN, BPH  Outpatient Medications Prior to Visit  Medication Sig Dispense Refill  . amLODipine-olmesartan (AZOR) 10-40 MG tablet TAKE 1 TABLET EVERY DAY 90 tablet 1  . B Complex Vitamins (VITAMIN B COMPLEX) TABS Take 1 tablet by mouth daily.    . carvedilol (COREG) 25 MG tablet 1TAKE 1 TABLET (25 MG TOTAL) BY MOUTH 2 (TWO) TIMES DAILY WITH A MEAL. 180 tablet 2  . Cholecalciferol (VITAMIN D3) 2000 units capsule Take 1 capsule (2,000 Units total) by mouth daily. 100 capsule 3  . fluticasone (FLONASE) 50 MCG/ACT nasal spray PLACE 2 SPRAYS INTO BOTH NOSTRILS DAILY. 48 g 1  . Lidocaine (HM LIDOCAINE PATCH) 4 % PTCH Apply 1 patch topically every 12 (twelve) hours as needed. 30 patch 0  . meloxicam (MOBIC) 15 MG tablet Take 1 tablet (15 mg total) by mouth daily as needed for pain. 90 tablet 1  . Multiple Vitamins-Minerals (SENIOR MULTIVITAMIN PLUS) TABS Take 1 tablet by mouth daily.    . tadalafil (CIALIS) 5 MG tablet Take 1 tablet (5 mg total) by mouth daily. 30 tablet 11  . meloxicam (MOBIC) 15 MG tablet TAKE 1 TABLET (15 MG TOTAL) BY MOUTH DAILY AS NEEDED FOR PAIN. 90 tablet 1   No facility-administered medications prior to visit.    ROS: Review of Systems  Constitutional:  Negative for appetite change, fatigue and unexpected weight change.  HENT:  Negative for congestion, nosebleeds, sneezing, sore throat and trouble swallowing.   Eyes:  Negative for itching and visual disturbance.  Respiratory:  Negative for cough.   Cardiovascular:  Negative for chest pain, palpitations and leg swelling.  Gastrointestinal:  Negative for abdominal distention, blood in stool, diarrhea and nausea.  Genitourinary:  Negative for frequency and hematuria.  Musculoskeletal:  Negative for back pain, gait problem, joint swelling and neck  pain.  Skin:  Negative for rash.  Neurological:  Negative for dizziness, tremors, speech difficulty and weakness.  Psychiatric/Behavioral:  Negative for agitation, dysphoric mood and sleep disturbance. The patient is not nervous/anxious.     Objective:  BP 130/86 (BP Location: Right Arm, Patient Position: Sitting, Cuff Size: Large)   Pulse (!) 55   Temp 98.2 F (36.8 C) (Oral)   Ht 6' 1"$  (1.854 m)   Wt 228 lb (103.4 kg)   SpO2 99%   BMI 30.08 kg/m   BP Readings from Last 3 Encounters:  07/04/22 130/86  04/02/22 128/62  01/15/22 112/62    Wt Readings from Last 3 Encounters:  07/04/22 228 lb (103.4 kg)  05/09/22 222 lb (100.7 kg)  04/02/22 225 lb (102.1 kg)    Physical Exam Constitutional:      General: He is not in acute distress.    Appearance: Normal appearance. He is well-developed.     Comments: NAD  Eyes:     Conjunctiva/sclera: Conjunctivae normal.     Pupils: Pupils are equal, round, and reactive to light.  Neck:     Thyroid: No thyromegaly.     Vascular: No JVD.  Cardiovascular:     Rate and Rhythm: Normal rate and regular rhythm.     Heart sounds: Normal heart sounds. No murmur heard.    No friction rub. No gallop.  Pulmonary:     Effort: Pulmonary effort is normal.  No respiratory distress.     Breath sounds: Normal breath sounds. No wheezing or rales.  Chest:     Chest wall: No tenderness.  Abdominal:     General: Bowel sounds are normal. There is no distension.     Palpations: Abdomen is soft. There is no mass.     Tenderness: There is no abdominal tenderness. There is no guarding or rebound.  Musculoskeletal:        General: Tenderness present. Normal range of motion.     Cervical back: Normal range of motion.  Lymphadenopathy:     Cervical: No cervical adenopathy.  Skin:    General: Skin is warm and dry.     Findings: No rash.  Neurological:     Mental Status: He is alert and oriented to person, place, and time.     Cranial Nerves: No cranial  nerve deficit.     Motor: No abnormal muscle tone.     Coordination: Coordination normal.     Gait: Gait normal.     Deep Tendon Reflexes: Reflexes are normal and symmetric.  Psychiatric:        Behavior: Behavior normal.        Thought Content: Thought content normal.        Judgment: Judgment normal.  Post-op knee  Lab Results  Component Value Date   WBC 6.6 01/15/2022   HGB 14.9 01/15/2022   HCT 43.5 01/15/2022   PLT 208.0 01/15/2022   GLUCOSE 96 01/15/2022   CHOL 167 09/11/2021   TRIG 122.0 09/11/2021   HDL 36.30 (L) 09/11/2021   LDLDIRECT 134.0 02/08/2012   LDLCALC 106 (H) 09/11/2021   ALT 15 01/15/2022   AST 16 01/15/2022   NA 143 01/15/2022   K 4.2 01/15/2022   CL 105 01/15/2022   CREATININE 0.97 01/15/2022   BUN 13 01/15/2022   CO2 25 01/15/2022   TSH 3.84 01/15/2022   PSA 0.90 09/11/2021   HGBA1C 5.8 09/22/2020    CT Chest Wo Contrast  Result Date: 01/27/2022 CLINICAL DATA:  History of a pulmonary nodule. EXAM: CT CHEST WITHOUT CONTRAST TECHNIQUE: Multidetector CT imaging of the chest was performed following the standard protocol without IV contrast. RADIATION DOSE REDUCTION: This exam was performed according to the departmental dose-optimization program which includes automated exposure control, adjustment of the mA and/or kV according to patient size and/or use of iterative reconstruction technique. COMPARISON:  Cardiac CT 03/28/2021. FINDINGS: Cardiovascular: No significant vascular findings. Normal heart size. No pericardial effusion. Calcific aortic atherosclerosis noted. Mediastinum/Nodes: No enlarged mediastinal or axillary lymph nodes. Thyroid gland, trachea, and esophagus demonstrate no significant findings. Lungs/Pleura: No pleural effusion. A 0.4 cm nodule in the right middle lobe is unchanged on image 87, series 5. There is some left basilar atelectasis or scar, unchanged. Upper Abdomen: 1.2 cm cyst or hemangioma in the left hepatic lobe on image 137, series  2 is noted. There is 0.4 cm nonobstructing stone in the upper pole the right kidney. Musculoskeletal: A 0.7 cm in diameter sclerotic lesion in T6 is unchanged and likely benign bone island. IMPRESSION: No change in a 0.4 cm right middle lobe pulmonary nodule. No follow-up is indicated. 0.4 cm nonobstructing stone upper pole right kidney. Aortic Atherosclerosis (ICD10-I70.0). Electronically Signed   By: Inge Rise M.D.   On: 01/27/2022 07:31    Assessment & Plan:   Problem List Items Addressed This Visit       Cardiovascular and Mediastinum   Essential hypertension  Continue with Coreg, Benicar and Amlodipine        Other   Vitamin D deficiency    On Vit D      Knee pain, right    S/p TKR - some swelling, heat present Start Meloxicam po      Insomnia - Primary    Pt declined sleeping pills Try Melatonin         No orders of the defined types were placed in this encounter.     Follow-up: Return in about 3 months (around 10/02/2022) for a follow-up visit.  Walker Kehr, MD

## 2022-07-04 NOTE — Assessment & Plan Note (Signed)
Pt declined sleeping pills Try Melatonin

## 2022-07-07 ENCOUNTER — Other Ambulatory Visit: Payer: Self-pay | Admitting: Internal Medicine

## 2022-07-07 DIAGNOSIS — R519 Headache, unspecified: Secondary | ICD-10-CM

## 2022-12-19 ENCOUNTER — Encounter (INDEPENDENT_AMBULATORY_CARE_PROVIDER_SITE_OTHER): Payer: Self-pay

## 2023-01-02 ENCOUNTER — Encounter: Payer: Self-pay | Admitting: Internal Medicine

## 2023-01-02 ENCOUNTER — Ambulatory Visit (INDEPENDENT_AMBULATORY_CARE_PROVIDER_SITE_OTHER): Payer: Medicare HMO | Admitting: Internal Medicine

## 2023-01-02 VITALS — BP 130/82 | HR 47 | Temp 98.2°F | Ht 73.0 in | Wt 224.0 lb

## 2023-01-02 DIAGNOSIS — R972 Elevated prostate specific antigen [PSA]: Secondary | ICD-10-CM

## 2023-01-02 DIAGNOSIS — F17200 Nicotine dependence, unspecified, uncomplicated: Secondary | ICD-10-CM | POA: Insufficient documentation

## 2023-01-02 DIAGNOSIS — E538 Deficiency of other specified B group vitamins: Secondary | ICD-10-CM | POA: Diagnosis not present

## 2023-01-02 DIAGNOSIS — E785 Hyperlipidemia, unspecified: Secondary | ICD-10-CM

## 2023-01-02 DIAGNOSIS — I10 Essential (primary) hypertension: Secondary | ICD-10-CM

## 2023-01-02 DIAGNOSIS — Z86010 Personal history of colonic polyps: Secondary | ICD-10-CM

## 2023-01-02 DIAGNOSIS — R739 Hyperglycemia, unspecified: Secondary | ICD-10-CM

## 2023-01-02 DIAGNOSIS — E559 Vitamin D deficiency, unspecified: Secondary | ICD-10-CM

## 2023-01-02 LAB — CBC WITH DIFFERENTIAL/PLATELET
Basophils Absolute: 0 10*3/uL (ref 0.0–0.1)
Basophils Relative: 0.6 % (ref 0.0–3.0)
Eosinophils Absolute: 0.2 10*3/uL (ref 0.0–0.7)
Eosinophils Relative: 2.6 % (ref 0.0–5.0)
HCT: 44.8 % (ref 39.0–52.0)
Hemoglobin: 14.9 g/dL (ref 13.0–17.0)
Lymphocytes Relative: 28.6 % (ref 12.0–46.0)
Lymphs Abs: 2.1 10*3/uL (ref 0.7–4.0)
MCHC: 33.4 g/dL (ref 30.0–36.0)
MCV: 89.3 fl (ref 78.0–100.0)
Monocytes Absolute: 0.9 10*3/uL (ref 0.1–1.0)
Monocytes Relative: 12.2 % — ABNORMAL HIGH (ref 3.0–12.0)
Neutro Abs: 4.2 10*3/uL (ref 1.4–7.7)
Neutrophils Relative %: 56 % (ref 43.0–77.0)
Platelets: 181 10*3/uL (ref 150.0–400.0)
RBC: 5.01 Mil/uL (ref 4.22–5.81)
RDW: 13.9 % (ref 11.5–15.5)
WBC: 7.5 10*3/uL (ref 4.0–10.5)

## 2023-01-02 LAB — URINALYSIS
Bilirubin Urine: NEGATIVE
Hgb urine dipstick: NEGATIVE
Ketones, ur: NEGATIVE
Leukocytes,Ua: NEGATIVE
Nitrite: NEGATIVE
Specific Gravity, Urine: 1.02 (ref 1.000–1.030)
Total Protein, Urine: NEGATIVE
Urine Glucose: NEGATIVE
Urobilinogen, UA: 0.2 (ref 0.0–1.0)
pH: 6 (ref 5.0–8.0)

## 2023-01-02 LAB — COMPREHENSIVE METABOLIC PANEL
ALT: 11 U/L (ref 0–53)
AST: 13 U/L (ref 0–37)
Albumin: 4.3 g/dL (ref 3.5–5.2)
Alkaline Phosphatase: 94 U/L (ref 39–117)
BUN: 11 mg/dL (ref 6–23)
CO2: 28 mEq/L (ref 19–32)
Calcium: 9.8 mg/dL (ref 8.4–10.5)
Chloride: 107 mEq/L (ref 96–112)
Creatinine, Ser: 0.96 mg/dL (ref 0.40–1.50)
GFR: 77.23 mL/min (ref >60.00)
Glucose, Bld: 130 mg/dL — ABNORMAL HIGH (ref 70–99)
Potassium: 3.9 mEq/L (ref 3.5–5.1)
Sodium: 141 mEq/L (ref 135–145)
Total Bilirubin: 0.7 mg/dL (ref 0.2–1.2)
Total Protein: 7.1 g/dL (ref 6.0–8.3)

## 2023-01-02 LAB — LIPID PANEL
Cholesterol: 189 mg/dL (ref 0–200)
HDL: 36.2 mg/dL — ABNORMAL LOW (ref >39.00)
LDL Cholesterol: 130 mg/dL — ABNORMAL HIGH (ref 0–99)
NonHDL: 152.33
Total CHOL/HDL Ratio: 5
Triglycerides: 114 mg/dL (ref 0.0–149.0)
VLDL: 22.8 mg/dL (ref 0.0–40.0)

## 2023-01-02 LAB — VITAMIN B12: Vitamin B-12: >1501 pg/mL — ABNORMAL HIGH (ref 211–911)

## 2023-01-02 LAB — PSA: PSA: 0.95 ng/mL (ref 0.10–4.00)

## 2023-01-02 LAB — HEMOGLOBIN A1C: Hgb A1c MFr Bld: 5.6 % (ref 4.6–6.5)

## 2023-01-02 LAB — TSH: TSH: 2.31 u[IU]/mL (ref 0.35–5.50)

## 2023-01-02 MED ORDER — TADALAFIL 5 MG PO TABS: 5.0000 mg | ORAL_TABLET | Freq: Every day | ORAL | 11 refills | Status: DC

## 2023-01-02 NOTE — Assessment & Plan Note (Signed)
Colonoscopy is due in 2028 - Dr Marina Goodell

## 2023-01-02 NOTE — Progress Notes (Signed)
Subjective:  Patient ID: [name] [name], male    DOB: 1945-06-12  Age: 77 y.o. MRN: [id number]  CC: Follow-up (6 mnth f/u)   HPI CLAUDY SOTO presents for HTN, smoking Tom stopped smoking C/o ED  Outpatient Medications Prior to Visit  Medication Sig Dispense Refill   amLODipine-olmesartan (AZOR) 10-40 MG tablet TAKE 1 TABLET EVERY DAY 90 tablet 1   B Complex Vitamins (VITAMIN B COMPLEX) TABS Take 1 tablet by mouth daily.     carvedilol (COREG) 25 MG tablet 1TAKE 1 TABLET (25 MG TOTAL) BY MOUTH 2 (TWO) TIMES DAILY WITH A MEAL. 180 tablet 2   Cholecalciferol (VITAMIN D3) 2000 units capsule Take 1 capsule (2,000 Units total) by mouth daily. 100 capsule 3   fluticasone (FLONASE) 50 MCG/ACT nasal spray PLACE 2 SPRAYS INTO BOTH NOSTRILS DAILY. 48 g 3   Lidocaine (HM LIDOCAINE PATCH) 4 % PTCH Apply 1 patch topically every 12 (twelve) hours as needed. 30 patch 0   meloxicam (MOBIC) 15 MG tablet Take 1 tablet (15 mg total) by mouth daily as needed for pain. 90 tablet 1   Multiple Vitamins-Minerals (SENIOR MULTIVITAMIN PLUS) TABS Take 1 tablet by mouth daily.     tadalafil (CIALIS) 5 MG tablet Take 1 tablet (5 mg total) by mouth daily. 30 tablet 11   No facility-administered medications prior to visit.    ROS: Review of Systems  Constitutional:  Negative for appetite change, fatigue and unexpected weight change.  HENT:  Negative for congestion, nosebleeds, sneezing, sore throat and trouble swallowing.   Eyes:  Negative for itching and visual disturbance.  Respiratory:  Negative for cough.   Cardiovascular:  Negative for chest pain, palpitations and leg swelling.  Gastrointestinal:  Negative for abdominal distention, blood in stool, diarrhea and nausea.  Genitourinary:  Negative for frequency and hematuria.  Musculoskeletal:  Positive for arthralgias. Negative for back pain, gait problem, joint swelling and neck pain.  Skin:  Negative for rash.  Neurological:  Negative for dizziness,  tremors, speech difficulty and weakness.  Psychiatric/Behavioral:  Negative for agitation, dysphoric mood and sleep disturbance. The patient is not nervous/anxious.     Objective:  BP 130/82 (BP Location: Left Arm, Patient Position: Sitting, Cuff Size: Large)   Pulse (!) 47   Temp 98.2 F (36.8 C) (Oral)   Ht 6\' 1"  (1.854 m)   Wt 224 lb (101.6 kg)   SpO2 96%   BMI 29.55 kg/m   BP Readings from Last 3 Encounters:  01/02/23 130/82  07/04/22 130/86  04/02/22 128/62    Wt Readings from Last 3 Encounters:  01/02/23 224 lb (101.6 kg)  07/04/22 228 lb (103.4 kg)  05/09/22 222 lb (100.7 kg)    Physical Exam Constitutional:      General: He is not in acute distress.    Appearance: He is well-developed.     Comments: NAD  Eyes:     Conjunctiva/sclera: Conjunctivae normal.     Pupils: Pupils are equal, round, and reactive to light.  Neck:     Thyroid: No thyromegaly.     Vascular: No JVD.  Cardiovascular:     Rate and Rhythm: Normal rate and regular rhythm.     Heart sounds: Normal heart sounds. No murmur heard.    No friction rub. No gallop.  Pulmonary:     Effort: Pulmonary effort is normal. No respiratory distress.     Breath sounds: Normal breath sounds. No wheezing or rales.  Chest:  Chest wall: No tenderness.  Abdominal:     General: Bowel sounds are normal. There is no distension.     Palpations: Abdomen is soft. There is no mass.     Tenderness: There is no abdominal tenderness. There is no guarding or rebound.  Musculoskeletal:        General: No tenderness. Normal range of motion.     Cervical back: Normal range of motion.  Lymphadenopathy:     Cervical: No cervical adenopathy.  Skin:    General: Skin is warm and dry.     Findings: No rash.  Neurological:     Mental Status: He is alert and oriented to person, place, and time.     Cranial Nerves: No cranial nerve deficit.     Motor: No abnormal muscle tone.     Coordination: Coordination normal.      Gait: Gait normal.     Deep Tendon Reflexes: Reflexes are normal and symmetric.  Psychiatric:        Behavior: Behavior normal.        Thought Content: Thought content normal.        Judgment: Judgment normal.     Lab Results  Component Value Date   WBC 7.8 07/04/2022   HGB 15.6 07/04/2022   HCT 45.5 07/04/2022   PLT 184.0 07/04/2022   GLUCOSE 105 (H) 07/04/2022   CHOL 167 09/11/2021   TRIG 122.0 09/11/2021   HDL 36.30 (L) 09/11/2021   LDLDIRECT 134.0 02/08/2012   LDLCALC 106 (H) 09/11/2021   ALT 16 07/04/2022   AST 17 07/04/2022   NA 141 07/04/2022   K 3.8 07/04/2022   CL 106 07/04/2022   CREATININE 1.06 07/04/2022   BUN 12 07/04/2022   CO2 28 07/04/2022   TSH 3.84 01/15/2022   PSA 0.90 09/11/2021   HGBA1C 5.8 09/22/2020    CT Chest Wo Contrast  Result Date: 01/27/2022 CLINICAL DATA:  History of a pulmonary nodule. EXAM: CT CHEST WITHOUT CONTRAST TECHNIQUE: Multidetector CT imaging of the chest was performed following the standard protocol without IV contrast. RADIATION DOSE REDUCTION: This exam was performed according to the departmental dose-optimization program which includes automated exposure control, adjustment of the mA and/or kV according to patient size and/or use of iterative reconstruction technique. COMPARISON:  Cardiac CT 03/28/2021. FINDINGS: Cardiovascular: No significant vascular findings. Normal heart size. No pericardial effusion. Calcific aortic atherosclerosis noted. Mediastinum/Nodes: No enlarged mediastinal or axillary lymph nodes. Thyroid gland, trachea, and esophagus demonstrate no significant findings. Lungs/Pleura: No pleural effusion. A 0.4 cm nodule in the right middle lobe is unchanged on image 87, series 5. There is some left basilar atelectasis or scar, unchanged. Upper Abdomen: 1.2 cm cyst or hemangioma in the left hepatic lobe on image 137, series 2 is noted. There is 0.4 cm nonobstructing stone in the upper pole the right kidney. Musculoskeletal:  A 0.7 cm in diameter sclerotic lesion in T6 is unchanged and likely benign bone island. IMPRESSION: No change in a 0.4 cm right middle lobe pulmonary nodule. No follow-up is indicated. 0.4 cm nonobstructing stone upper pole right kidney. Aortic Atherosclerosis (ICD10-I70.0). Electronically Signed   By: Drusilla Kanner M.D.   On: 01/27/2022 07:31    Assessment & Plan:   Problem List Items Addressed This Visit     Cobalamin deficiency - Primary    On B12      Relevant Orders   Vitamin B12   Dyslipidemia     11/22 CT coronary calcium score is  0.      Relevant Orders   TSH   Lipid panel   Comprehensive metabolic panel   Essential hypertension    CT coronary calcium score is 0.       Relevant Medications   tadalafil (CIALIS) 5 MG tablet   Elevated PSA    Monitor PSA      Relevant Orders   Urinalysis   Lipid panel   PSA   History of colonic polyps    Colonoscopy is due in 2028 - Dr Marina Goodell      Vitamin D deficiency    On Vit D      Hyperglycemia    Monitor A1c      Relevant Orders   Urinalysis   CBC with Differential/Platelet   Hemoglobin A1c   Tobacco use disorder    Smoking cigarillos, inhaling Quit 12/2022  [facility] lung cancer study was suggested to join vs CT         Meds ordered this encounter  Medications   tadalafil (CIALIS) 5 MG tablet    Sig: Take 1 tablet (5 mg total) by mouth daily.    Dispense:  30 tablet    Refill:  11      Follow-up: Return in about 6 months (around 07/05/2023) for a follow-up visit.  Sonda Primes, MD

## 2023-01-02 NOTE — Assessment & Plan Note (Signed)
On Vit D 

## 2023-01-02 NOTE — Assessment & Plan Note (Signed)
11/22 CT coronary calcium score is 0.

## 2023-01-02 NOTE — Assessment & Plan Note (Addendum)
Smoking cigarillos, inhaling Quit 12/2022  [facility] lung cancer study was suggested to join vs CT

## 2023-01-02 NOTE — Assessment & Plan Note (Signed)
On B12 

## 2023-01-02 NOTE — Patient Instructions (Signed)
Colonoscopy is due in 2028 - Dr Marina Goodell

## 2023-01-02 NOTE — Assessment & Plan Note (Signed)
CT coronary calcium score is 0.

## 2023-01-02 NOTE — Assessment & Plan Note (Signed)
Monitor PSA 

## 2023-01-02 NOTE — Assessment & Plan Note (Signed)
Monitor A1c 

## 2023-01-06 ENCOUNTER — Other Ambulatory Visit: Payer: Self-pay | Admitting: Internal Medicine

## 2023-04-25 DIAGNOSIS — H25813 Combined forms of age-related cataract, bilateral: Secondary | ICD-10-CM | POA: Diagnosis not present

## 2023-05-01 DIAGNOSIS — H524 Presbyopia: Secondary | ICD-10-CM | POA: Diagnosis not present

## 2023-07-01 ENCOUNTER — Other Ambulatory Visit: Payer: Self-pay | Admitting: Internal Medicine

## 2023-07-01 NOTE — Telephone Encounter (Signed)
 Copied from CRM [id number]. Topic: Clinical - Medication Refill >> Jul 01, 2023 11:55 AM Aline Ireland wrote: Most Recent Primary Care Visit:  Provider: PLOTNIKOV, ALEKSEI V  Department: [facility]  Visit Type: OFFICE VISIT  Date: 01/02/2023  Medication: carvedilol  (COREG ) 25 MG table amLODipine -olmesartan  (AZOR ) 10-40 MG tablet  Has the patient contacted their pharmacy? Yes (Agent: If no, request that the patient contact the pharmacy for the refill. If patient does not wish to contact the pharmacy document the reason why and proceed with request.) (Agent: If yes, when and what did the pharmacy advise?) Pharmacy informed him that there are no refills on file   Is this the correct pharmacy for this prescription? Yes If no, delete pharmacy and type the correct one.  This is the patient's preferred pharmacy:  [facility] [address] [place] - [address] [place]tha Blight [place] [address] Phone: [phone] Fax: [phone]   Has the prescription been filled recently? Yes  Is the patient out of the medication? Yes- only the carvedilol  (COREG ) 25 MG table  Has the patient been seen for an appointment in the last year OR does the patient have an upcoming appointment?   Can we respond through MyChart?   Agent: Please be advised that Rx refills may take up to 3 business days. We ask that you follow-up with your pharmacy.

## 2023-07-03 ENCOUNTER — Ambulatory Visit: Payer: Medicare HMO | Admitting: Internal Medicine

## 2023-07-03 ENCOUNTER — Telehealth: Payer: Self-pay

## 2023-07-03 MED ORDER — CARVEDILOL 25 MG PO TABS: ORAL_TABLET | ORAL | 2 refills | Status: AC

## 2023-07-03 MED ORDER — AMLODIPINE-OLMESARTAN 10-40 MG PO TABS: 1.0000 | ORAL_TABLET | Freq: Every day | ORAL | 3 refills | Status: AC

## 2023-07-03 NOTE — Telephone Encounter (Unsigned)
Copied from CRM [id number]. Topic: Clinical - Prescription Issue >> Jul 02, 2023  4:10 PM [name] [name] wrote: Reason for CRM: [facility] called in stating that patient need a refill of carvedilol (COREG) 25 MG tablet sen into them. There was another request to have it sent to [facility] ,however [facility] states that he has already had it filled with them.

## 2023-07-09 ENCOUNTER — Ambulatory Visit (INDEPENDENT_AMBULATORY_CARE_PROVIDER_SITE_OTHER): Payer: Medicare HMO | Admitting: Internal Medicine

## 2023-07-09 ENCOUNTER — Encounter: Payer: Self-pay | Admitting: Internal Medicine

## 2023-07-09 VITALS — BP 118/60 | HR 53 | Temp 98.6°F | Ht 73.0 in | Wt 229.0 lb

## 2023-07-09 DIAGNOSIS — M25661 Stiffness of right knee, not elsewhere classified: Secondary | ICD-10-CM

## 2023-07-09 DIAGNOSIS — M5440 Lumbago with sciatica, unspecified side: Secondary | ICD-10-CM

## 2023-07-09 DIAGNOSIS — I10 Essential (primary) hypertension: Secondary | ICD-10-CM | POA: Diagnosis not present

## 2023-07-09 DIAGNOSIS — R972 Elevated prostate specific antigen [PSA]: Secondary | ICD-10-CM | POA: Diagnosis not present

## 2023-07-09 DIAGNOSIS — E538 Deficiency of other specified B group vitamins: Secondary | ICD-10-CM | POA: Diagnosis not present

## 2023-07-09 DIAGNOSIS — G8929 Other chronic pain: Secondary | ICD-10-CM | POA: Diagnosis not present

## 2023-07-09 DIAGNOSIS — E559 Vitamin D deficiency, unspecified: Secondary | ICD-10-CM

## 2023-07-09 DIAGNOSIS — R739 Hyperglycemia, unspecified: Secondary | ICD-10-CM

## 2023-07-09 NOTE — Assessment & Plan Note (Signed)
 Continue with Coreg, Benicar and Amlodipine CT coronary calcium score is 0.

## 2023-07-09 NOTE — Progress Notes (Signed)
 Subjective:  Patient ID: [name] [name], male    DOB: [birth year]  Age: 78 y.o. MRN: [id number]  CC: Medical Management of Chronic Issues (6 mnth f/u)   HPI RAYMEL CULL presents for HTN, knee OA, BPH  Outpatient Medications Prior to Visit  Medication Sig Dispense Refill   amLODipine-olmesartan (AZOR) 10-40 MG tablet Take 1 tablet by mouth daily. 90 tablet 3   B Complex Vitamins (VITAMIN B COMPLEX) TABS Take 1 tablet by mouth daily.     carvedilol (COREG) 25 MG tablet 1TAKE 1 TABLET (25 MG TOTAL) BY MOUTH 2 (TWO) TIMES DAILY WITH A MEAL. 180 tablet 2   Cholecalciferol (VITAMIN D3) 2000 units capsule Take 1 capsule (2,000 Units total) by mouth daily. 100 capsule 3   fluticasone (FLONASE) 50 MCG/ACT nasal spray PLACE 2 SPRAYS INTO BOTH NOSTRILS DAILY. 48 g 3   Lidocaine (HM LIDOCAINE PATCH) 4 % PTCH Apply 1 patch topically every 12 (twelve) hours as needed. 30 patch 0   meloxicam (MOBIC) 15 MG tablet Take 1 tablet (15 mg total) by mouth daily as needed for pain. 90 tablet 1   Multiple Vitamins-Minerals (SENIOR MULTIVITAMIN PLUS) TABS Take 1 tablet by mouth daily.     tadalafil (CIALIS) 5 MG tablet Take 1 tablet (5 mg total) by mouth daily. 30 tablet 11   No facility-administered medications prior to visit.    ROS: Review of Systems  Constitutional:  Negative for appetite change, fatigue and unexpected weight change.  HENT:  Negative for congestion, nosebleeds, sneezing, sore throat and trouble swallowing.   Eyes:  Negative for itching and visual disturbance.  Respiratory:  Negative for cough.   Cardiovascular:  Negative for chest pain, palpitations and leg swelling.  Gastrointestinal:  Negative for abdominal distention, blood in stool, diarrhea and nausea.  Genitourinary:  Negative for frequency and hematuria.  Musculoskeletal:  Positive for arthralgias and back pain. Negative for gait problem, joint swelling and neck pain.  Skin:  Negative for rash.  Neurological:  Negative for  dizziness, tremors, speech difficulty and weakness.  Psychiatric/Behavioral:  Negative for agitation, dysphoric mood, sleep disturbance and suicidal ideas. The patient is not nervous/anxious.     Objective:  BP 118/60   Pulse (!) 53   Temp 98.6 F (37 C) (Oral)   Ht 6\' 1"  (1.854 m)   Wt 229 lb (103.9 kg)   SpO2 96%   BMI 30.21 kg/m   BP Readings from Last 3 Encounters:  07/09/23 118/60  01/02/23 130/82  07/04/22 130/86    Wt Readings from Last 3 Encounters:  07/09/23 229 lb (103.9 kg)  01/02/23 224 lb (101.6 kg)  07/04/22 228 lb (103.4 kg)    Physical Exam Constitutional:      General: He is not in acute distress.    Appearance: Normal appearance. He is well-developed. He is obese.     Comments: NAD  Eyes:     Conjunctiva/sclera: Conjunctivae normal.     Pupils: Pupils are equal, round, and reactive to light.  Neck:     Thyroid: No thyromegaly.     Vascular: No JVD.  Cardiovascular:     Rate and Rhythm: Normal rate and regular rhythm.     Heart sounds: Normal heart sounds. No murmur heard.    No friction rub. No gallop.  Pulmonary:     Effort: Pulmonary effort is normal. No respiratory distress.     Breath sounds: Normal breath sounds. No wheezing or rales.  Chest:  Chest wall: No tenderness.  Abdominal:     General: Bowel sounds are normal. There is no distension.     Palpations: Abdomen is soft. There is no mass.     Tenderness: There is no abdominal tenderness. There is no guarding or rebound.  Musculoskeletal:        General: No tenderness. Normal range of motion.     Cervical back: Normal range of motion.  Lymphadenopathy:     Cervical: No cervical adenopathy.  Skin:    General: Skin is warm and dry.     Findings: No rash.  Neurological:     Mental Status: He is alert and oriented to person, place, and time.     Cranial Nerves: No cranial nerve deficit.     Motor: No abnormal muscle tone.     Coordination: Coordination normal.     Gait: Gait  normal.     Deep Tendon Reflexes: Reflexes are normal and symmetric.  Psychiatric:        Behavior: Behavior normal.        Thought Content: Thought content normal.        Judgment: Judgment normal.   Stiff LS, R knee  Lab Results  Component Value Date   WBC 7.5 01/02/2023   HGB 14.9 01/02/2023   HCT 44.8 01/02/2023   PLT 181.0 01/02/2023   GLUCOSE 130 (H) 01/02/2023   CHOL 189 01/02/2023   TRIG 114.0 01/02/2023   HDL 36.20 (L) 01/02/2023   LDLDIRECT 134.0 02/08/2012   LDLCALC 130 (H) 01/02/2023   ALT 11 01/02/2023   AST 13 01/02/2023   NA 141 01/02/2023   K 3.9 01/02/2023   CL 107 01/02/2023   CREATININE 0.96 01/02/2023   BUN 11 01/02/2023   CO2 28 01/02/2023   TSH 2.31 01/02/2023   PSA 0.95 01/02/2023   HGBA1C 5.6 01/02/2023    CT Chest Wo Contrast Result Date: 01/27/2022 CLINICAL DATA:  History of a pulmonary nodule. EXAM: CT CHEST WITHOUT CONTRAST TECHNIQUE: Multidetector CT imaging of the chest was performed following the standard protocol without IV contrast. RADIATION DOSE REDUCTION: This exam was performed according to the departmental dose-optimization program which includes automated exposure control, adjustment of the mA and/or kV according to patient size and/or use of iterative reconstruction technique. COMPARISON:  Cardiac CT 03/28/2021. FINDINGS: Cardiovascular: No significant vascular findings. Normal heart size. No pericardial effusion. Calcific aortic atherosclerosis noted. Mediastinum/Nodes: No enlarged mediastinal or axillary lymph nodes. Thyroid gland, trachea, and esophagus demonstrate no significant findings. Lungs/Pleura: No pleural effusion. A 0.4 cm nodule in the right middle lobe is unchanged on image 87, series 5. There is some left basilar atelectasis or scar, unchanged. Upper Abdomen: 1.2 cm cyst or hemangioma in the left hepatic lobe on image 137, series 2 is noted. There is 0.4 cm nonobstructing stone in the upper pole the right kidney.  Musculoskeletal: A 0.7 cm in diameter sclerotic lesion in T6 is unchanged and likely benign bone island. IMPRESSION: No change in a 0.4 cm right middle lobe pulmonary nodule. No follow-up is indicated. 0.4 cm nonobstructing stone upper pole right kidney. Aortic Atherosclerosis (ICD10-I70.0). Electronically Signed   By: Drusilla Kanner M.D.   On: 01/27/2022 07:31    Assessment & Plan:   Problem List Items Addressed This Visit     Cobalamin deficiency - Primary   Taking B12 periodically Check Vit B12      Relevant Orders   CBC with Differential/Platelet   Vitamin B12   Essential  hypertension   Continue with Coreg, Benicar and Amlodipine CT coronary calcium score is 0.        Relevant Orders   TSH   Urinalysis   CBC with Differential/Platelet   Lipid panel   LOW BACK PAIN   Better      Relevant Orders   TSH   Urinalysis   CBC with Differential/Platelet   Lipid panel   PSA   Comprehensive metabolic panel   Hemoglobin A1c   Vitamin B12   VITAMIN D 25 Hydroxy (Vit-D Deficiency, Fractures)   Elevated PSA   Monitoring PSA      Relevant Orders   PSA   Vitamin D deficiency   On Vit D      Relevant Orders   VITAMIN D 25 Hydroxy (Vit-D Deficiency, Fractures)   Hyperglycemia   Relevant Orders   Hemoglobin A1c   Stiffness of right knee   Take Meloxicam prn - pt declined      Relevant Orders   TSH   Urinalysis   Lipid panel      No orders of the defined types were placed in this encounter.     Follow-up: Return in about 6 months (around 01/06/2024) for Wellness Exam.  Sonda Primes, MD

## 2023-07-09 NOTE — Assessment & Plan Note (Signed)
 Better

## 2023-07-09 NOTE — Assessment & Plan Note (Signed)
 On Vit D

## 2023-07-09 NOTE — Assessment & Plan Note (Signed)
Monitoring PSA 

## 2023-07-09 NOTE — Assessment & Plan Note (Signed)
 Taking B12 periodically Check Vit B12

## 2023-07-09 NOTE — Assessment & Plan Note (Signed)
 Take Meloxicam prn - pt declined

## 2023-07-12 ENCOUNTER — Other Ambulatory Visit: Payer: Medicare HMO

## 2023-07-12 DIAGNOSIS — R972 Elevated prostate specific antigen [PSA]: Secondary | ICD-10-CM

## 2023-07-12 DIAGNOSIS — M5440 Lumbago with sciatica, unspecified side: Secondary | ICD-10-CM | POA: Diagnosis not present

## 2023-07-12 DIAGNOSIS — M25661 Stiffness of right knee, not elsewhere classified: Secondary | ICD-10-CM

## 2023-07-12 DIAGNOSIS — R739 Hyperglycemia, unspecified: Secondary | ICD-10-CM | POA: Diagnosis not present

## 2023-07-12 DIAGNOSIS — E538 Deficiency of other specified B group vitamins: Secondary | ICD-10-CM | POA: Diagnosis not present

## 2023-07-12 DIAGNOSIS — E559 Vitamin D deficiency, unspecified: Secondary | ICD-10-CM | POA: Diagnosis not present

## 2023-07-12 DIAGNOSIS — G8929 Other chronic pain: Secondary | ICD-10-CM

## 2023-07-12 DIAGNOSIS — I10 Essential (primary) hypertension: Secondary | ICD-10-CM | POA: Diagnosis not present

## 2023-07-12 LAB — CBC WITH DIFFERENTIAL/PLATELET
Basophils Absolute: 0 10*3/uL (ref 0.0–0.1)
Basophils Relative: 0.6 % (ref 0.0–3.0)
Eosinophils Absolute: 0.1 10*3/uL (ref 0.0–0.7)
Eosinophils Relative: 2.3 % (ref 0.0–5.0)
HCT: 44.3 % (ref 39.0–52.0)
Hemoglobin: 15 g/dL (ref 13.0–17.0)
Lymphocytes Relative: 28.9 % (ref 12.0–46.0)
Lymphs Abs: 1.9 10*3/uL (ref 0.7–4.0)
MCHC: 33.9 g/dL (ref 30.0–36.0)
MCV: 88.2 fL (ref 78.0–100.0)
Monocytes Absolute: 0.7 10*3/uL (ref 0.1–1.0)
Monocytes Relative: 10.2 % (ref 3.0–12.0)
Neutro Abs: 3.8 10*3/uL (ref 1.4–7.7)
Neutrophils Relative %: 58 % (ref 43.0–77.0)
Platelets: 189 10*3/uL (ref 150.0–400.0)
RBC: 5.02 Mil/uL (ref 4.22–5.81)
RDW: 14.1 % (ref 11.5–15.5)
WBC: 6.5 10*3/uL (ref 4.0–10.5)

## 2023-07-12 LAB — URINALYSIS
Bilirubin Urine: NEGATIVE
Hgb urine dipstick: NEGATIVE
Ketones, ur: NEGATIVE
Leukocytes,Ua: NEGATIVE
Nitrite: NEGATIVE
Specific Gravity, Urine: 1.02 (ref 1.000–1.030)
Total Protein, Urine: NEGATIVE
Urine Glucose: NEGATIVE
Urobilinogen, UA: 0.2 (ref 0.0–1.0)
pH: 6 (ref 5.0–8.0)

## 2023-07-12 LAB — COMPREHENSIVE METABOLIC PANEL
ALT: 9 U/L (ref 0–53)
AST: 13 U/L (ref 0–37)
Albumin: 4.3 g/dL (ref 3.5–5.2)
Alkaline Phosphatase: 84 U/L (ref 39–117)
BUN: 13 mg/dL (ref 6–23)
CO2: 29 meq/L (ref 19–32)
Calcium: 9.6 mg/dL (ref 8.4–10.5)
Chloride: 105 meq/L (ref 96–112)
Creatinine, Ser: 0.96 mg/dL (ref 0.40–1.50)
GFR: 76.94 mL/min (ref >60.00)
Glucose, Bld: 96 mg/dL (ref 70–99)
Potassium: 4.2 meq/L (ref 3.5–5.1)
Sodium: 143 meq/L (ref 135–145)
Total Bilirubin: 1 mg/dL (ref 0.2–1.2)
Total Protein: 7.4 g/dL (ref 6.0–8.3)

## 2023-07-12 LAB — LIPID PANEL
Cholesterol: 201 mg/dL — ABNORMAL HIGH (ref 0–200)
HDL: 44.6 mg/dL (ref >39.00)
LDL Cholesterol: 140 mg/dL — ABNORMAL HIGH (ref 0–99)
NonHDL: 156.16
Total CHOL/HDL Ratio: 5
Triglycerides: 83 mg/dL (ref 0.0–149.0)
VLDL: 16.6 mg/dL (ref 0.0–40.0)

## 2023-07-12 LAB — VITAMIN B12: Vitamin B-12: 361 pg/mL (ref 211–911)

## 2023-07-12 LAB — PSA: PSA: 1.17 ng/mL (ref 0.10–4.00)

## 2023-07-12 LAB — HEMOGLOBIN A1C: Hgb A1c MFr Bld: 5.7 % (ref 4.6–6.5)

## 2023-07-12 LAB — VITAMIN D 25 HYDROXY (VIT D DEFICIENCY, FRACTURES): VITD: 40.58 ng/mL (ref 30.00–100.00)

## 2023-07-12 LAB — TSH: TSH: 3.06 u[IU]/mL (ref 0.35–5.50)

## 2023-07-15 ENCOUNTER — Encounter: Payer: Self-pay | Admitting: Internal Medicine

## 2023-08-20 ENCOUNTER — Ambulatory Visit (INDEPENDENT_AMBULATORY_CARE_PROVIDER_SITE_OTHER): Payer: Medicare HMO

## 2023-08-20 VITALS — Ht 73.0 in | Wt 229.0 lb

## 2023-08-20 DIAGNOSIS — Z1211 Encounter for screening for malignant neoplasm of colon: Secondary | ICD-10-CM

## 2023-08-20 DIAGNOSIS — Z0000 Encounter for general adult medical examination without abnormal findings: Secondary | ICD-10-CM

## 2023-08-20 DIAGNOSIS — Z1212 Encounter for screening for malignant neoplasm of rectum: Secondary | ICD-10-CM | POA: Diagnosis not present

## 2023-08-20 NOTE — Progress Notes (Cosign Needed Addendum)
 Subjective:   [name] [name] is a 78 y.o. who presents for a Medicare Wellness preventive visit.  Visit Complete: Virtual I connected with  [name] [name] on 08/20/23 by a audio enabled telemedicine application and verified that I am speaking with the correct person using two identifiers.  Patient Location: Home  Provider Location: Home Office  I discussed the limitations of evaluation and management by telemedicine. The patient expressed understanding and agreed to proceed.  Vital Signs: Because this visit was a virtual/telehealth visit, some criteria may be missing or patient reported. Any vitals not documented were not able to be obtained and vitals that have been documented are patient reported.  VideoDeclined- This patient declined Librarian, academic. Therefore the visit was completed with audio only.  Persons Participating in Visit: Patient.  AWV Questionnaire: No: Patient Medicare AWV questionnaire was not completed prior to this visit.  Cardiac Risk Factors include: advanced age (>66men, >64 women);hypertension;male gender;dyslipidemia     Objective:    Today's Vitals   08/20/23 0802  Weight: 229 lb (103.9 kg)  Height: 6\' 1"  (1.854 m)   Body mass index is 30.21 kg/m.     08/20/2023    8:22 AM 05/09/2022   11:18 AM 05/08/2021    2:00 PM 04/11/2021    5:32 PM 03/03/2020   10:00 AM 12/05/2019    5:49 AM 06/26/2017    8:51 AM  Advanced Directives  Does Patient Have a Medical Advance Directive? No No Yes No No No No  Type of Surveyor, minerals;Living will      Copy of Healthcare Power of Attorney in Chart?   No - copy requested      Would patient like information on creating a medical advance directive?  No - Patient declined   No - Patient declined  Yes (ED - Information included in AVS)    Current Medications (verified) Outpatient Encounter Medications as of 08/20/2023  Medication Sig   amLODipine-olmesartan  (AZOR) 10-40 MG tablet Take 1 tablet by mouth daily.   B Complex Vitamins (VITAMIN B COMPLEX) TABS Take 1 tablet by mouth daily.   carvedilol (COREG) 25 MG tablet 1TAKE 1 TABLET (25 MG TOTAL) BY MOUTH 2 (TWO) TIMES DAILY WITH A MEAL.   Cholecalciferol (VITAMIN D3) 2000 units capsule Take 1 capsule (2,000 Units total) by mouth daily.   fluticasone (FLONASE) 50 MCG/ACT nasal spray PLACE 2 SPRAYS INTO BOTH NOSTRILS DAILY.   Lidocaine (HM LIDOCAINE PATCH) 4 % PTCH Apply 1 patch topically every 12 (twelve) hours as needed.   meloxicam (MOBIC) 15 MG tablet Take 1 tablet (15 mg total) by mouth daily as needed for pain.   Multiple Vitamins-Minerals (SENIOR MULTIVITAMIN PLUS) TABS Take 1 tablet by mouth daily.   tadalafil (CIALIS) 5 MG tablet Take 1 tablet (5 mg total) by mouth daily.   [DISCONTINUED] spironolactone (ALDACTONE) 50 MG tablet Take 50 mg by mouth daily. For blood pressure    No facility-administered encounter medications on file as of 08/20/2023.    Allergies (verified) Hydrochlorothiazide   History: Past Medical History:  Diagnosis Date   Allergy    Arthritis    BPH (benign prostatic hyperplasia)    ED (erectile dysfunction)    GERD (gastroesophageal reflux disease)    Heart murmur    hx   Hyperlipidemia    Hypertension    LBP (low back pain)    PONV (postoperative nausea and vomiting)    with  epidural inj   Vitamin B 12 deficiency    Past Surgical History:  Procedure Laterality Date   BACK SURGERY     fusion of lower back    COLONOSCOPY     KNEE ARTHROSCOPY  02   Right   POLYPECTOMY     Family History  Problem Relation Age of Onset   Cirrhosis Mother    Alcohol abuse Mother    Cirrhosis Father    Alcohol abuse Father    Heart disease Sister 24       MI   Colon cancer Neg Hx    Colon polyps Neg Hx    Esophageal cancer Neg Hx    Rectal cancer Neg Hx    Stomach cancer Neg Hx    Social History   Socioeconomic History   Marital status: Significant Other     Spouse name: Not on file   Number of children: 5   Years of education: Not on file   Highest education level: Not on file  Occupational History   Occupation: RETIRED/DRIVER    Employer: [employer]    Comment: night shift  Tobacco Use   Smoking status: Some Days    Current packs/day: 0.25    Average packs/day: 0.3 packs/day for 41.0 years (10.3 ttl pk-yrs)    Types: Cigars, Cigarettes   Smokeless tobacco: Never   Tobacco comments:    1-2/d  occ smoke   occ drink liquor  Vaping Use   Vaping status: Never Used  Substance and Sexual Activity   Alcohol use: Yes    Comment: Occasional   Drug use: No   Sexual activity: Yes  Other Topics Concern   Not on file  Social History Narrative   Family history of hypertension      Lives with girlfriend.   Social Drivers of Corporate investment banker Strain: Low Risk  (08/20/2023)   Overall Financial Resource Strain (CARDIA)    Difficulty of Paying Living Expenses: Not hard at all  Food Insecurity: No Food Insecurity (08/20/2023)   Hunger Vital Sign    Worried About Running Out of Food in the Last Year: Never true    Ran Out of Food in the Last Year: Never true  Transportation Needs: No Transportation Needs (08/20/2023)   PRAPARE - Administrator, Civil Service (Medical): No    Lack of Transportation (Non-Medical): No  Physical Activity: Sufficiently Active (08/20/2023)   Exercise Vital Sign    Days of Exercise per Week: 4 days    Minutes of Exercise per Session: 90 min  Stress: No Stress Concern Present (08/20/2023)   [employer] of Occupational Health - Occupational Stress Questionnaire    Feeling of Stress : Not at all  Social Connections: Moderately Isolated (08/20/2023)   Social Connection and Isolation Panel [NHANES]    Frequency of Communication with Friends and Family: More than three times a week    Frequency of Social Gatherings with Friends and Family: Once a week    Attends Religious Services: Never     Database administrator or Organizations: No    Attends Engineer, structural: Never    Marital Status: Living with partner    Tobacco Counseling Ready to quit: Not Answered Counseling given: Not Answered Tobacco comments: 1-2/d  occ smoke   occ drink liquor    Clinical Intake:  Pre-visit preparation completed: Yes  Pain : No/denies pain     BMI - recorded: 30.21 Nutritional  Status: BMI > 30  Obese Nutritional Risks: None  Lab Results  Component Value Date   HGBA1C 5.7 07/12/2023   HGBA1C 5.6 01/02/2023   HGBA1C 5.8 09/22/2020     How often do you need to have someone help you when you read instructions, pamphlets, or other written materials from your doctor or pharmacy?: 1 - Never  Interpreter Needed?: No  Information entered by :: Cheryl Chay, RMA   Activities of Daily Living     08/20/2023    8:39 AM  In your present state of health, do you have any difficulty performing the following activities:  Hearing? 0  Vision? 0  Difficulty concentrating or making decisions? 0  Walking or climbing stairs? 0  Dressing or bathing? 0  Doing errands, shopping? 0  Preparing Food and eating ? N  Using the Toilet? N  In the past six months, have you accidently leaked urine? N  Do you have problems with loss of bowel control? N  Managing your Medications? N  Managing your Finances? N  Housekeeping or managing your Housekeeping? N    Patient Care Team: Plotnikov, Georgina Quint, MD as PCP - General Plotnikov, Georgina Quint, MD Hilarie Fredrickson, MD as Consulting Physician (Gastroenterology) [facility], [facility] as Consulting Physician (Ophthalmology) Eugenia Mcalpine, MD (Inactive) as Consulting Physician (Orthopedic Surgery)  Indicate any recent Medical Services you may have received from other than [facility] providers in the past year (date may be approximate).     Assessment:   This is a routine wellness examination for [facility].  Hearing/Vision screen Hearing  Screening - Comments:: Denies hearing difficulties   Vision Screening - Comments:: Wears eyeglasses   Goals Addressed             This Visit's Progress    To maintain my current health status by continuing to eat healthy, stay physically active and socially active.   On track      Depression Screen     08/20/2023    8:24 AM 07/09/2023    9:45 AM 07/04/2022    8:51 AM 05/09/2022   11:21 AM 01/15/2022    2:16 PM 06/27/2021    9:22 AM 05/08/2021    2:01 PM  PHQ 2/9 Scores  PHQ - 2 Score 0 0 0 0 2 0 0  PHQ- 9 Score 3    8      Fall Risk     08/20/2023    8:22 AM 07/09/2023    9:45 AM 07/04/2022    8:51 AM 05/09/2022   11:19 AM 01/15/2022    2:15 PM  Fall Risk   Falls in the past year? 0 0 0 0 0  Number falls in past yr: 0 0 0 0 0  Injury with Fall? 0 0 0 0 0  Risk for fall due to : No Fall Risks No Fall Risks No Fall Risks No Fall Risks History of fall(s)  Follow up Falls prevention discussed;Falls evaluation completed Falls evaluation completed Falls evaluation completed Falls prevention discussed     MEDICARE RISK AT HOME:  Medicare Risk at Home Any stairs in or around the home?: Yes If so, are there any without handrails?: Yes Home free of loose throw rugs in walkways, pet beds, electrical cords, etc?: Yes Adequate lighting in your home to reduce risk of falls?: Yes Life alert?: No Use of a cane, walker or w/c?: No Grab bars in the bathroom?: Yes Shower chair or bench in shower?: No Elevated toilet  seat or a handicapped toilet?: No  TIMED UP AND GO:  Was the test performed?  No  Cognitive Function: 6CIT completed        08/20/2023    8:36 AM 05/09/2022   11:24 AM  6CIT Screen  What Year? 0 points 0 points  What month? 0 points 0 points  What time? 0 points 0 points  Count back from 20 0 points 0 points  Months in reverse 0 points 0 points  Repeat phrase 0 points 0 points  Total Score 0 points 0 points    Immunizations Immunization History  Administered  Date(s) Administered   Fluad Quad(high Dose 65+) 05/07/2019, 02/18/2020, 04/02/2022   Influenza, High Dose Seasonal PF 03/04/2013, 06/25/2016, 03/01/2017, 03/07/2018   Influenza,inj,Quad PF,6+ Mos 01/13/2014, 06/22/2015   Influenza-Unspecified 01/23/2021, 05/10/2023   PFIZER(Purple Top)SARS-COV-2 Vaccination 07/05/2019, 07/28/2019, 03/05/2020   Pfizer(Comirnaty)Fall Seasonal Vaccine 12 years and older 05/10/2023   Pneumococcal Conjugate-13 09/09/2013   Pneumococcal Polysaccharide-23 09/03/2012   Td 03/30/2009   Td (Adult), 2 Lf Tetanus Toxid, Preservative Free 03/30/2009   Tdap 02/18/2020   Zoster Recombinant(Shingrix) 12/25/2017, 05/16/2018   Zoster, Live 02/14/2010    Screening Tests Health Maintenance  Topic Date Due   COVID-19 Vaccine (5 - 2024-25 season) 11/08/2023   INFLUENZA VACCINE  12/20/2023   Medicare Annual Wellness (AWV)  08/19/2024   DTaP/Tdap/Td (3 - Td or Tdap) 02/17/2030   Pneumonia Vaccine 19+ Years old  Completed   Hepatitis C Screening  Completed   Zoster Vaccines- Shingrix  Completed   HPV VACCINES  Aged Out   Colonoscopy  Discontinued    Health Maintenance  There are no preventive care reminders to display for this patient.  Health Maintenance Items Addressed: Referral sent to GI for colonoscopy  Additional Screening:  Vision Screening: Recommended annual ophthalmology exams for early detection of glaucoma and other disorders of the eye.  Dental Screening: Recommended annual dental exams for proper oral hygiene  Community Resource Referral / Chronic Care Management: CRR required this visit?  No   CCM required this visit?  No     Plan:     I have personally reviewed and noted the following in the patient's chart:   Medical and social history Use of alcohol, tobacco or illicit drugs  Current medications and supplements including opioid prescriptions. Patient is not currently taking opioid prescriptions. Functional ability and  status Nutritional status Physical activity Advanced directives List of other physicians Hospitalizations, surgeries, and ER visits in previous 12 months Vitals Screenings to include cognitive, depression, and falls Referrals and appointments  In addition, I have reviewed and discussed with patient certain preventive protocols, quality metrics, and best practice recommendations. A written personalized care plan for preventive services as well as general preventive health recommendations were provided to patient.     Marvyn Torrez L Cameran Pettey, CMA   08/20/2023   After Visit Summary: (MyChart) Due to this being a telephonic visit, the after visit summary with patients personalized plan was offered to patient via MyChart   Notes: Please refer to Routing Comments.  Medical screening examination/treatment/procedure(s) were performed by non-physician practitioner and as supervising physician I was immediately available for consultation/collaboration.  I agree with above. Jacinta Shoe, MD

## 2023-08-20 NOTE — Patient Instructions (Signed)
 Mr. [name] [name] , Thank you for taking time to come for your Medicare Wellness Visit. I appreciate your ongoing commitment to your health goals. Please review the following plan we discussed and let me know if I can assist you in the future.   Referrals/Orders/Follow-Ups/Clinician Recommendations: It was nice talking to you today.  You are due for a colonoscopy.  Please call [facility] at [phone], to schedule an appointment.  Keep up the good work.  This is a list of the screening recommended for you and due dates:  Health Maintenance  Topic Date Due   COVID-19 Vaccine (5 - 2024-25 season) 11/08/2023   Flu Shot  12/20/2023   Medicare Annual Wellness Visit  08/19/2024   DTaP/Tdap/Td vaccine (3 - Td or Tdap) 02/17/2030   Pneumonia Vaccine  Completed   Hepatitis C Screening  Completed   Zoster (Shingles) Vaccine  Completed   HPV Vaccine  Aged Out   Colon Cancer Screening  Discontinued    Advanced directives: (Declined) Advance directive discussed with you today. Even though you declined this today, please call our office should you change your mind, and we can give you the proper paperwork for you to fill out.  Next Medicare Annual Wellness Visit scheduled for next year: Yes

## 2023-08-21 ENCOUNTER — Telehealth: Payer: Self-pay | Admitting: Internal Medicine

## 2023-08-21 NOTE — Telephone Encounter (Signed)
 Thank you Dr. Marina Goodell,   I called and left a voicemail advising patient that he is not due until 2028.

## 2023-08-21 NOTE — Telephone Encounter (Signed)
 I have reviewed this patient's record. He is not due for routine follow-up until 2028. He is correct in terms of his procedure report.  At the time of his last colonoscopy his procedure report said his follow-up would be 5 to 10 years.  However, after the final pathology from his polyps return (they were benign and not precancerous), he was sent a results letter indicating that his follow-up would be 10 years (2028).  This is in the epic record. Thanks, Dr. Marina Goodell

## 2023-08-21 NOTE — Telephone Encounter (Signed)
 Good morning Dr. Marina Goodell,   Patient called stating that his PCP Dr. Posey Rea had sent over a referral to have a colonoscopy. Patient has a recall in for 2028. I looked at his colonoscopy report and it says repeat colonoscopy in 5-10 years and patient also told me that as well.   Patient requested I send a message to get your recommendations on when he needs to repeat procedure.   Will you please review and advise on scheduling patient?   Thank you.

## 2023-09-18 ENCOUNTER — Other Ambulatory Visit: Payer: Self-pay | Admitting: Internal Medicine

## 2023-09-18 DIAGNOSIS — R519 Headache, unspecified: Secondary | ICD-10-CM

## 2023-11-21 ENCOUNTER — Ambulatory Visit

## 2023-11-21 ENCOUNTER — Ambulatory Visit: Admitting: Internal Medicine

## 2023-11-21 VITALS — BP 128/70 | HR 43 | Temp 97.9°F | Ht 73.0 in | Wt 223.4 lb

## 2023-11-21 DIAGNOSIS — M545 Low back pain, unspecified: Secondary | ICD-10-CM

## 2023-11-21 DIAGNOSIS — E538 Deficiency of other specified B group vitamins: Secondary | ICD-10-CM | POA: Diagnosis not present

## 2023-11-21 DIAGNOSIS — Z981 Arthrodesis status: Secondary | ICD-10-CM | POA: Diagnosis not present

## 2023-11-21 DIAGNOSIS — M47816 Spondylosis without myelopathy or radiculopathy, lumbar region: Secondary | ICD-10-CM | POA: Diagnosis not present

## 2023-11-21 MED ORDER — MELOXICAM 15 MG PO TABS: 15.0000 mg | ORAL_TABLET | Freq: Every day | ORAL | 2 refills | Status: AC | PRN

## 2023-11-21 NOTE — Patient Instructions (Signed)
 USEFUL THINGS FOR ARTHRITIS and musculoskeletal pains:    A "rice sock heating pad" refers to a homemade heating pad created by filling a sock with uncooked rice, which can be heated in a microwave to provide a warm compress for sore muscles, pain relief, or other applications; essentially, it's a simple way to generate heat using readily available materials.  Key points about rice sock heat: How to make it: Fill a clean sock (preferably a tube sock) about 2/3 full with uncooked rice, tie a knot at the top to secure the rice inside.  Heating it up: Place the rice sock in the microwave and heat in short intervals (usually around 30 seconds at a time) until it reaches the desired warmth.  Important considerations: Check temperature before applying: Always test the temperature of the rice sock before applying it to your skin to avoid burns.  Use a towel to protect skin: Wrap the rice sock in a thin towel to distribute the heat evenly and protect your skin.  Uses: Muscle aches and pains  Menstrual cramps  Neck pain  Arthritis discomfort

## 2023-11-21 NOTE — Progress Notes (Signed)
 Subjective:  Patient ID: [name] [name], male    DOB: [birth year]  Age: 78 y.o. MRN: [id number]  CC: Back Pain (Chronic lower back pain. Recently pain has gotten worse (within the last week). Has had reconstructive surgery on spine in the past. Notes no urinary issues outside of increased urination. Attempted to treat with tylenol )   HPI NISHANTH MCCAUGHAN presents for R side LBP when bending down 10/10 x 10 days, 7/10 with other ROM, resting is ok  Outpatient Medications Prior to Visit  Medication Sig Dispense Refill   amLODipine -olmesartan  (AZOR ) 10-40 MG tablet Take 1 tablet by mouth daily. 90 tablet 3   carvedilol  (COREG ) 25 MG tablet 1TAKE 1 TABLET (25 MG TOTAL) BY MOUTH 2 (TWO) TIMES DAILY WITH A MEAL. 180 tablet 2   Cholecalciferol  (VITAMIN D3) 2000 units capsule Take 1 capsule (2,000 Units total) by mouth daily. 100 capsule 3   fluticasone  (FLONASE ) 50 MCG/ACT nasal spray USE 2 SPRAYS IN EACH NOSTRIL EVERY DAY 48 g 3   Lidocaine  (HM LIDOCAINE  PATCH) 4 % PTCH Apply 1 patch topically every 12 (twelve) hours as needed. 30 patch 0   tadalafil  (CIALIS ) 5 MG tablet Take 1 tablet (5 mg total) by mouth daily. 30 tablet 11   meloxicam  (MOBIC ) 15 MG tablet Take 1 tablet (15 mg total) by mouth daily as needed for pain. 90 tablet 1   B Complex Vitamins (VITAMIN B COMPLEX) TABS Take 1 tablet by mouth daily.     Multiple Vitamins-Minerals (SENIOR MULTIVITAMIN PLUS) TABS Take 1 tablet by mouth daily.     No facility-administered medications prior to visit.    ROS: Review of Systems  Constitutional:  Positive for activity change. Negative for appetite change, chills, fatigue, fever and unexpected weight change.  HENT:  Negative for congestion, nosebleeds, sneezing, sore throat and trouble swallowing.   Eyes:  Negative for itching and visual disturbance.  Respiratory:  Negative for cough.   Cardiovascular:  Negative for chest pain, palpitations and leg swelling.  Gastrointestinal:  Negative for  abdominal distention, blood in stool, diarrhea and nausea.  Genitourinary:  Negative for frequency and hematuria.  Musculoskeletal:  Positive for back pain. Negative for gait problem, joint swelling and neck pain.  Skin:  Negative for rash and wound.  Neurological:  Negative for dizziness, tremors, speech difficulty and weakness.  Psychiatric/Behavioral:  Negative for agitation, dysphoric mood and sleep disturbance. The patient is not nervous/anxious.     Objective:  BP 128/70   Pulse (!) 43   Temp 97.9 F (36.6 C)   Ht 6' 1 (1.854 m)   Wt 223 lb 6.4 oz (101.3 kg)   SpO2 98%   BMI 29.47 kg/m   BP Readings from Last 3 Encounters:  11/21/23 128/70  07/09/23 118/60  01/02/23 130/82    Wt Readings from Last 3 Encounters:  11/21/23 223 lb 6.4 oz (101.3 kg)  08/20/23 229 lb (103.9 kg)  07/09/23 229 lb (103.9 kg)    Physical Exam Constitutional:      General: He is not in acute distress.    Appearance: Normal appearance. He is well-developed.     Comments: NAD  Eyes:     Conjunctiva/sclera: Conjunctivae normal.     Pupils: Pupils are equal, round, and reactive to light.  Neck:     Thyroid : No thyromegaly.     Vascular: No JVD.  Cardiovascular:     Rate and Rhythm: Normal rate and regular rhythm.     Heart  sounds: Normal heart sounds. No murmur heard.    No friction rub. No gallop.  Pulmonary:     Effort: Pulmonary effort is normal. No respiratory distress.     Breath sounds: Normal breath sounds. No wheezing or rales.  Chest:     Chest wall: No tenderness.  Abdominal:     General: Bowel sounds are normal. There is no distension.     Palpations: Abdomen is soft. There is no mass.     Tenderness: There is no abdominal tenderness. There is no guarding or rebound.  Musculoskeletal:        General: Tenderness present. Normal range of motion.     Cervical back: Normal range of motion.     Right lower leg: No edema.     Left lower leg: No edema.  Lymphadenopathy:      Cervical: No cervical adenopathy.  Skin:    General: Skin is warm and dry.     Findings: No lesion or rash.  Neurological:     Mental Status: He is alert and oriented to person, place, and time.     Cranial Nerves: No cranial nerve deficit.     Motor: No abnormal muscle tone.     Coordination: Coordination normal.     Gait: Gait normal.     Deep Tendon Reflexes: Reflexes are normal and symmetric.  Psychiatric:        Behavior: Behavior normal.        Thought Content: Thought content normal.        Judgment: Judgment normal.    R flank - painful muscles Str leg elev (-) B Scar on lower back R LS back pain w/bending over MS OK  Lab Results  Component Value Date   WBC 6.5 07/12/2023   HGB 15.0 07/12/2023   HCT 44.3 07/12/2023   PLT 189.0 07/12/2023   GLUCOSE 96 07/12/2023   CHOL 201 (H) 07/12/2023   TRIG 83.0 07/12/2023   HDL 44.60 07/12/2023   LDLDIRECT 134.0 02/08/2012   LDLCALC 140 (H) 07/12/2023   ALT 9 07/12/2023   AST 13 07/12/2023   NA 143 07/12/2023   K 4.2 07/12/2023   CL 105 07/12/2023   CREATININE 0.96 07/12/2023   BUN 13 07/12/2023   CO2 29 07/12/2023   TSH 3.06 07/12/2023   PSA 1.17 07/12/2023   HGBA1C 5.7 07/12/2023    CT Chest Wo Contrast Result Date: 01/27/2022 CLINICAL DATA:  History of a pulmonary nodule. EXAM: CT CHEST WITHOUT CONTRAST TECHNIQUE: Multidetector CT imaging of the chest was performed following the standard protocol without IV contrast. RADIATION DOSE REDUCTION: This exam was performed according to the departmental dose-optimization program which includes automated exposure control, adjustment of the mA and/or kV according to patient size and/or use of iterative reconstruction technique. COMPARISON:  Cardiac CT 03/28/2021. FINDINGS: Cardiovascular: No significant vascular findings. Normal heart size. No pericardial effusion. Calcific aortic atherosclerosis noted. Mediastinum/Nodes: No enlarged mediastinal or axillary lymph nodes. Thyroid   gland, trachea, and esophagus demonstrate no significant findings. Lungs/Pleura: No pleural effusion. A 0.4 cm nodule in the right middle lobe is unchanged on image 87, series 5. There is some left basilar atelectasis or scar, unchanged. Upper Abdomen: 1.2 cm cyst or hemangioma in the left hepatic lobe on image 137, series 2 is noted. There is 0.4 cm nonobstructing stone in the upper pole the right kidney. Musculoskeletal: A 0.7 cm in diameter sclerotic lesion in T6 is unchanged and likely benign bone island. IMPRESSION: No change in  a 0.4 cm right middle lobe pulmonary nodule. No follow-up is indicated. 0.4 cm nonobstructing stone upper pole right kidney. Aortic Atherosclerosis (ICD10-I70.0). Electronically Signed   By: Debby Prader M.D.   On: 01/27/2022 07:31    Assessment & Plan:   Problem List Items Addressed This Visit     Vitamin B12 deficiency   Taking B12  Check Vit B12 prn      LOW BACK PAIN - Primary   New R side LBP when bending down 10/10 x 10 days ?MSK LBP. No red flags... X ray LS Spine Meloxicam  Rx Heat ROM exercises      Relevant Medications   meloxicam  (MOBIC ) 15 MG tablet   Other Relevant Orders   DG Lumbar Spine 2-3 Views      Meds ordered this encounter  Medications   meloxicam  (MOBIC ) 15 MG tablet    Sig: Take 1 tablet (15 mg total) by mouth daily as needed for pain.    Dispense:  30 tablet    Refill:  2      Follow-up: No follow-ups on file.  Marolyn Noel, MD

## 2023-11-21 NOTE — Assessment & Plan Note (Addendum)
 New R side LBP when bending down 10/10 x 10 days ?MSK LBP. No red flags... X ray LS Spine Meloxicam  Rx Heat ROM exercises

## 2023-11-21 NOTE — Assessment & Plan Note (Signed)
 Taking B12  Check Vit B12 prn

## 2023-11-24 ENCOUNTER — Ambulatory Visit: Payer: Self-pay | Admitting: Internal Medicine

## 2024-01-06 ENCOUNTER — Encounter: Payer: Self-pay | Admitting: Internal Medicine

## 2024-01-06 ENCOUNTER — Ambulatory Visit (INDEPENDENT_AMBULATORY_CARE_PROVIDER_SITE_OTHER): Payer: Medicare HMO | Admitting: Internal Medicine

## 2024-01-06 VITALS — BP 125/80 | HR 57 | Temp 98.0°F | Ht 73.0 in | Wt 221.0 lb

## 2024-01-06 DIAGNOSIS — I10 Essential (primary) hypertension: Secondary | ICD-10-CM

## 2024-01-06 DIAGNOSIS — E785 Hyperlipidemia, unspecified: Secondary | ICD-10-CM

## 2024-01-06 DIAGNOSIS — R972 Elevated prostate specific antigen [PSA]: Secondary | ICD-10-CM

## 2024-01-06 DIAGNOSIS — R0989 Other specified symptoms and signs involving the circulatory and respiratory systems: Secondary | ICD-10-CM | POA: Diagnosis not present

## 2024-01-06 DIAGNOSIS — Z0000 Encounter for general adult medical examination without abnormal findings: Secondary | ICD-10-CM | POA: Diagnosis not present

## 2024-01-06 LAB — COMPREHENSIVE METABOLIC PANEL WITH GFR
ALT: 13 U/L (ref 0–53)
AST: 15 U/L (ref 0–37)
Albumin: 4.3 g/dL (ref 3.5–5.2)
Alkaline Phosphatase: 97 U/L (ref 39–117)
BUN: 12 mg/dL (ref 6–23)
CO2: 28 meq/L (ref 19–32)
Calcium: 9.5 mg/dL (ref 8.4–10.5)
Chloride: 107 meq/L (ref 96–112)
Creatinine, Ser: 1.05 mg/dL (ref 0.40–1.50)
GFR: 68.86 mL/min (ref >60.00)
Glucose, Bld: 101 mg/dL — ABNORMAL HIGH (ref 70–99)
Potassium: 4.1 meq/L (ref 3.5–5.1)
Sodium: 145 meq/L (ref 135–145)
Total Bilirubin: 0.8 mg/dL (ref 0.2–1.2)
Total Protein: 7 g/dL (ref 6.0–8.3)

## 2024-01-06 LAB — LIPID PANEL
Cholesterol: 163 mg/dL (ref 0–200)
HDL: 40 mg/dL (ref >39.00)
LDL Cholesterol: 107 mg/dL — ABNORMAL HIGH (ref 0–99)
NonHDL: 122.62
Total CHOL/HDL Ratio: 4
Triglycerides: 76 mg/dL (ref 0.0–149.0)
VLDL: 15.2 mg/dL (ref 0.0–40.0)

## 2024-01-06 LAB — URINALYSIS
Bilirubin Urine: NEGATIVE
Hgb urine dipstick: NEGATIVE
Ketones, ur: NEGATIVE
Leukocytes,Ua: NEGATIVE
Nitrite: NEGATIVE
Specific Gravity, Urine: 1.015 (ref 1.000–1.030)
Total Protein, Urine: NEGATIVE
Urine Glucose: NEGATIVE
Urobilinogen, UA: 2 — AB (ref 0.0–1.0)
pH: 6 (ref 5.0–8.0)

## 2024-01-06 LAB — CBC WITH DIFFERENTIAL/PLATELET
Basophils Absolute: 0 K/uL (ref 0.0–0.1)
Basophils Relative: 0.4 % (ref 0.0–3.0)
Eosinophils Absolute: 0.2 K/uL (ref 0.0–0.7)
Eosinophils Relative: 2.4 % (ref 0.0–5.0)
HCT: 43 % (ref 39.0–52.0)
Hemoglobin: 14.5 g/dL (ref 13.0–17.0)
Lymphocytes Relative: 25.7 % (ref 12.0–46.0)
Lymphs Abs: 1.6 K/uL (ref 0.7–4.0)
MCHC: 33.7 g/dL (ref 30.0–36.0)
MCV: 87.8 fl (ref 78.0–100.0)
Monocytes Absolute: 0.7 K/uL (ref 0.1–1.0)
Monocytes Relative: 11.7 % (ref 3.0–12.0)
Neutro Abs: 3.7 K/uL (ref 1.4–7.7)
Neutrophils Relative %: 59.8 % (ref 43.0–77.0)
Platelets: 171 K/uL (ref 150.0–400.0)
RBC: 4.89 Mil/uL (ref 4.22–5.81)
RDW: 14.1 % (ref 11.5–15.5)
WBC: 6.2 K/uL (ref 4.0–10.5)

## 2024-01-06 LAB — PSA: PSA: 1.11 ng/mL (ref 0.10–4.00)

## 2024-01-06 LAB — TSH: TSH: 2.64 u[IU]/mL (ref 0.35–5.50)

## 2024-01-06 NOTE — Assessment & Plan Note (Signed)
 Continue with Coreg, Benicar and Amlodipine CT coronary calcium score is 0.

## 2024-01-06 NOTE — Assessment & Plan Note (Addendum)
 Resolved. Monitoring PSA yearly

## 2024-01-06 NOTE — Progress Notes (Signed)
 Subjective:  Patient ID: [name] [name], male    DOB: 05-25-1945  Age: 78 y.o. MRN: [id number]  CC: Medical Management of Chronic Issues (6 month follow up)   HPI [name] presents for HTN, BPH, OA  Outpatient Medications Prior to Visit  Medication Sig Dispense Refill   amLODipine -olmesartan  (AZOR ) 10-40 MG tablet Take 1 tablet by mouth daily. 90 tablet 3   carvedilol  (COREG ) 25 MG tablet 1TAKE 1 TABLET (25 MG TOTAL) BY MOUTH 2 (TWO) TIMES DAILY WITH A MEAL. 180 tablet 2   Cholecalciferol  (VITAMIN D3) 2000 units capsule Take 1 capsule (2,000 Units total) by mouth daily. 100 capsule 3   fluticasone  (FLONASE ) 50 MCG/ACT nasal spray USE 2 SPRAYS IN EACH NOSTRIL EVERY DAY 48 g 3   Lidocaine  (HM LIDOCAINE  PATCH) 4 % PTCH Apply 1 patch topically every 12 (twelve) hours as needed. 30 patch 0   meloxicam  (MOBIC ) 15 MG tablet Take 1 tablet (15 mg total) by mouth daily as needed for pain. 30 tablet 2   Omega-3 Fatty Acids (OMEGA 3 PO) Take by mouth.     tadalafil  (CIALIS ) 5 MG tablet Take 1 tablet (5 mg total) by mouth daily. 30 tablet 11   B Complex Vitamins (VITAMIN B COMPLEX) TABS Take 1 tablet by mouth daily.     Multiple Vitamins-Minerals (SENIOR MULTIVITAMIN PLUS) TABS Take 1 tablet by mouth daily.     No facility-administered medications prior to visit.    ROS: Review of Systems  Constitutional:  Negative for appetite change, fatigue and unexpected weight change.  HENT:  Negative for congestion, nosebleeds, sneezing, sore throat and trouble swallowing.   Eyes:  Negative for itching and visual disturbance.  Respiratory:  Negative for cough.   Cardiovascular:  Negative for chest pain, palpitations and leg swelling.  Gastrointestinal:  Negative for abdominal distention, blood in stool, diarrhea and nausea.  Genitourinary:  Negative for frequency and hematuria.  Musculoskeletal:  Positive for arthralgias. Negative for back pain, gait problem, joint swelling and neck pain.  Skin:   Negative for rash.  Neurological:  Negative for dizziness, tremors, speech difficulty and weakness.  Psychiatric/Behavioral:  Negative for agitation, dysphoric mood and sleep disturbance. The patient is not nervous/anxious.     Objective:  BP 125/80   Pulse (!) 57   Temp 98 F (36.7 C) (Temporal)   Ht 6' 1 (1.854 m)   Wt 221 lb (100.2 kg)   SpO2 97%   BMI 29.16 kg/m   BP Readings from Last 3 Encounters:  01/06/24 125/80  11/21/23 128/70  07/09/23 118/60    Wt Readings from Last 3 Encounters:  01/06/24 221 lb (100.2 kg)  11/21/23 223 lb 6.4 oz (101.3 kg)  08/20/23 229 lb (103.9 kg)    Physical Exam Constitutional:      General: He is not in acute distress.    Appearance: Normal appearance. He is well-developed.     Comments: NAD  Eyes:     Conjunctiva/sclera: Conjunctivae normal.     Pupils: Pupils are equal, round, and reactive to light.  Neck:     Thyroid : No thyromegaly.     Vascular: No JVD.  Cardiovascular:     Rate and Rhythm: Normal rate and regular rhythm.     Heart sounds: Normal heart sounds. No murmur heard.    No friction rub. No gallop.  Pulmonary:     Effort: Pulmonary effort is normal. No respiratory distress.     Breath sounds: Normal breath  sounds. No wheezing or rales.  Chest:     Chest wall: No tenderness.  Abdominal:     General: Bowel sounds are normal. There is no distension.     Palpations: Abdomen is soft. There is no mass.     Tenderness: There is no abdominal tenderness. There is no guarding or rebound.  Musculoskeletal:        General: No tenderness. Normal range of motion.     Cervical back: Normal range of motion.  Lymphadenopathy:     Cervical: No cervical adenopathy.  Skin:    General: Skin is warm and dry.     Findings: No rash.  Neurological:     Mental Status: He is alert and oriented to person, place, and time.     Cranial Nerves: No cranial nerve deficit.     Motor: No abnormal muscle tone.     Coordination:  Coordination normal.     Gait: Gait normal.     Deep Tendon Reflexes: Reflexes are normal and symmetric.  Psychiatric:        Behavior: Behavior normal.        Thought Content: Thought content normal.        Judgment: Judgment normal.    Mild bruit   Lab Results  Component Value Date   WBC 6.5 07/12/2023   HGB 15.0 07/12/2023   HCT 44.3 07/12/2023   PLT 189.0 07/12/2023   GLUCOSE 96 07/12/2023   CHOL 201 (H) 07/12/2023   TRIG 83.0 07/12/2023   HDL 44.60 07/12/2023   LDLDIRECT 134.0 02/08/2012   LDLCALC 140 (H) 07/12/2023   ALT 9 07/12/2023   AST 13 07/12/2023   NA 143 07/12/2023   K 4.2 07/12/2023   CL 105 07/12/2023   CREATININE 0.96 07/12/2023   BUN 13 07/12/2023   CO2 29 07/12/2023   TSH 3.06 07/12/2023   PSA 1.17 07/12/2023   HGBA1C 5.7 07/12/2023    CT Chest Wo Contrast Result Date: 01/27/2022 CLINICAL DATA:  History of a pulmonary nodule. EXAM: CT CHEST WITHOUT CONTRAST TECHNIQUE: Multidetector CT imaging of the chest was performed following the standard protocol without IV contrast. RADIATION DOSE REDUCTION: This exam was performed according to the departmental dose-optimization program which includes automated exposure control, adjustment of the mA and/or kV according to patient size and/or use of iterative reconstruction technique. COMPARISON:  Cardiac CT 03/28/2021. FINDINGS: Cardiovascular: No significant vascular findings. Normal heart size. No pericardial effusion. Calcific aortic atherosclerosis noted. Mediastinum/Nodes: No enlarged mediastinal or axillary lymph nodes. Thyroid  gland, trachea, and esophagus demonstrate no significant findings. Lungs/Pleura: No pleural effusion. A 0.4 cm nodule in the right middle lobe is unchanged on image 87, series 5. There is some left basilar atelectasis or scar, unchanged. Upper Abdomen: 1.2 cm cyst or hemangioma in the left hepatic lobe on image 137, series 2 is noted. There is 0.4 cm nonobstructing stone in the upper pole the  right kidney. Musculoskeletal: A 0.7 cm in diameter sclerotic lesion in T6 is unchanged and likely benign bone island. IMPRESSION: No change in a 0.4 cm right middle lobe pulmonary nodule. No follow-up is indicated. 0.4 cm nonobstructing stone upper pole right kidney. Aortic Atherosclerosis (ICD10-I70.0). Electronically Signed   By: Debby Prader M.D.   On: 01/27/2022 07:31    Assessment & Plan:   Problem List Items Addressed This Visit     Bruit   Check art doppler      Relevant Orders   VAS US  CAROTID   Dyslipidemia  Declined statins  11/22 CT coronary calcium score is 0.       Elevated PSA   Resolved. Monitoring PSA yearly      Relevant Orders   PSA   Essential hypertension - Primary   Continue with Coreg , Benicar  and Amlodipine  CT coronary calcium score is 0.        Well adult exam   Relevant Orders   TSH   Urinalysis   CBC with Differential/Platelet   Lipid panel   PSA   Comprehensive metabolic panel with GFR      No orders of the defined types were placed in this encounter.     Follow-up: Return in about 6 months (around 07/08/2024) for Wellness Exam.  Marolyn Noel, MD

## 2024-01-06 NOTE — Assessment & Plan Note (Signed)
Check art doppler 

## 2024-01-06 NOTE — Assessment & Plan Note (Signed)
 Declined statins  11/22 CT coronary calcium score is 0.

## 2024-01-08 ENCOUNTER — Ambulatory Visit (HOSPITAL_COMMUNITY)
Admission: RE | Admit: 2024-01-08 | Discharge: 2024-01-08 | Disposition: A | Discharge status: Home / Self Care | Source: Ambulatory Visit | Attending: Internal Medicine | Admitting: Internal Medicine

## 2024-01-08 ENCOUNTER — Ambulatory Visit: Payer: Self-pay | Admitting: Internal Medicine

## 2024-01-08 DIAGNOSIS — R0989 Other specified symptoms and signs involving the circulatory and respiratory systems: Secondary | ICD-10-CM | POA: Insufficient documentation

## 2024-01-22 ENCOUNTER — Other Ambulatory Visit: Payer: Self-pay | Admitting: Internal Medicine

## 2024-07-08 ENCOUNTER — Encounter: Admitting: Internal Medicine

## 2024-08-20 ENCOUNTER — Ambulatory Visit

## 2024-09-04 ENCOUNTER — Ambulatory Visit
